# Patient Record
Sex: Female | Born: 1980 | Race: Black or African American | Hispanic: No | Marital: Single | Smoking: Current every day smoker
Health system: Southern US, Community
[De-identification: ages and names within clinical notes are randomized; demographics above are authoritative.]

## PROBLEM LIST (undated history)

## (undated) DIAGNOSIS — I10 Essential (primary) hypertension: Secondary | ICD-10-CM

## (undated) DIAGNOSIS — R102 Pelvic and perineal pain: Secondary | ICD-10-CM

## (undated) DIAGNOSIS — N83209 Unspecified ovarian cyst, unspecified side: Secondary | ICD-10-CM

## (undated) DIAGNOSIS — M1990 Unspecified osteoarthritis, unspecified site: Secondary | ICD-10-CM

## (undated) DIAGNOSIS — Z87442 Personal history of urinary calculi: Secondary | ICD-10-CM

## (undated) DIAGNOSIS — F419 Anxiety disorder, unspecified: Secondary | ICD-10-CM

## (undated) DIAGNOSIS — E669 Obesity, unspecified: Secondary | ICD-10-CM

## (undated) DIAGNOSIS — M25569 Pain in unspecified knee: Secondary | ICD-10-CM

## (undated) DIAGNOSIS — N809 Endometriosis, unspecified: Secondary | ICD-10-CM

## (undated) DIAGNOSIS — R87629 Unspecified abnormal cytological findings in specimens from vagina: Secondary | ICD-10-CM

## (undated) DIAGNOSIS — G8929 Other chronic pain: Secondary | ICD-10-CM

## (undated) DIAGNOSIS — N200 Calculus of kidney: Secondary | ICD-10-CM

## (undated) HISTORY — PX: WISDOM TOOTH EXTRACTION: SHX21

## (undated) HISTORY — PX: ENDOMETRIAL ABLATION: SHX621

## (undated) HISTORY — PX: CHOLECYSTECTOMY: SHX55

## (undated) HISTORY — DX: Unspecified abnormal cytological findings in specimens from vagina: R87.629

## (undated) HISTORY — DX: Essential (primary) hypertension: I10

---

## 2014-06-15 ENCOUNTER — Emergency Department (HOSPITAL_BASED_OUTPATIENT_CLINIC_OR_DEPARTMENT_OTHER)
Admission: EM | Admit: 2014-06-15 | Discharge: 2014-06-15 | Disposition: A | Discharge status: Home / Self Care | Payer: Self-pay | Attending: Emergency Medicine | Admitting: Emergency Medicine

## 2014-06-15 ENCOUNTER — Encounter (HOSPITAL_BASED_OUTPATIENT_CLINIC_OR_DEPARTMENT_OTHER): Payer: Self-pay | Admitting: Emergency Medicine

## 2014-06-15 DIAGNOSIS — Z880 Allergy status to penicillin: Secondary | ICD-10-CM | POA: Insufficient documentation

## 2014-06-15 DIAGNOSIS — R109 Unspecified abdominal pain: Secondary | ICD-10-CM | POA: Insufficient documentation

## 2014-06-15 DIAGNOSIS — R1030 Lower abdominal pain, unspecified: Secondary | ICD-10-CM

## 2014-06-15 DIAGNOSIS — E669 Obesity, unspecified: Secondary | ICD-10-CM | POA: Insufficient documentation

## 2014-06-15 DIAGNOSIS — F17200 Nicotine dependence, unspecified, uncomplicated: Secondary | ICD-10-CM | POA: Insufficient documentation

## 2014-06-15 HISTORY — DX: Obesity, unspecified: E66.9

## 2014-06-15 MED ORDER — NAPROXEN 500 MG PO TABS: 500.0000 mg | ORAL_TABLET | Freq: Two times a day (BID) | ORAL | Status: DC

## 2014-06-15 MED ORDER — NAPROXEN 250 MG PO TABS
500.0000 mg | ORAL_TABLET | Freq: Once | ORAL | Status: AC
  Administered 2014-06-15: 500 mg via ORAL
  Filled 2014-06-15: qty 2

## 2014-06-15 MED ORDER — NAPROXEN 125 MG/5ML PO SUSP
500.0000 mg | Freq: Two times a day (BID) | ORAL | Status: DC
  Filled 2014-06-15: qty 20

## 2014-06-15 NOTE — ED Notes (Signed)
PA at bedside.

## 2014-06-15 NOTE — Discharge Instructions (Signed)
Call for a follow up appointment with a Family or Primary Care Provider.  Return if Symptoms worsen.   Take medication as prescribed.    Emergency Department Resource Guide 1) Find a Doctor and Pay Out of Pocket Although you won't have to find out who is covered by your insurance plan, it is a good idea to ask around and get recommendations. You will then need to call the office and see if the doctor you have chosen will accept you as a new patient and what types of options they offer for patients who are self-pay. Some doctors offer discounts or will set up payment plans for their patients who do not have insurance, but you will need to ask so you aren't surprised when you get to your appointment.  2) Contact Your Local Health Department Not all health departments have doctors that can see patients for sick visits, but many do, so it is worth a call to see if yours does. If you don't know where your local health department is, you can check in your phone book. The CDC also has a tool to help you locate your state's health department, and many state websites also have listings of all of their local health departments.  3) Find a [facility] If your illness is not likely to be very severe or complicated, you may want to try a walk in clinic. These are popping up all over the country in pharmacies, drugstores, and shopping centers. They're usually staffed by nurse practitioners or physician assistants that have been trained to treat common illnesses and complaints. They're usually fairly quick and inexpensive. However, if you have serious medical issues or chronic medical problems, these are probably not your best option.  No Primary Care Doctor: - Call Health Connect at  [phone] - they can help you locate a primary care doctor that  accepts your insurance, provides certain services, etc. - Physician Referral Service- [phone]  Chronic Pain Problems: Organization         Address  Phone    Notes  [facility]  [phone] Patients need to be referred by their primary care doctor.   Medication Assistance: Organization         Address  Phone   Notes  [facility] Medication [facility] [address] [place], [place] [address] [phone] --Must be a resident of [facility] -- Must have NO insurance coverage whatsoever (no Medicaid/ Medicare, etc.) -- The pt. MUST have a primary care doctor that directs their care regularly and follows them in the community   Med[facility]ist  [phone]   [employer]  [phone]    Agencies that provide inexpensive medical care: Organization         Address  Phone   Notes  [place]  [phone]   [facility]    [phone]   [facility] [place],  [phone]   [place] [address], [place] [phone]   Planned Parenthood    [phone]   [facility]    [phone]   [place] and [address], [place] Phone:  [phone], Fax:  [phone] Hours of Operation:  9 am - 6 pm, M-F.  Also accepts Medicaid/Medicare and self-pay.  [facility] for [place] [address], [place]  Phone: [phone], Fax: [phone]. Hours of Operation:  8:30 am - 5:30 pm, M-F.  Also accepts Medicaid and self-pay.  [facility] [place] [address] [place] Phone: [phone]   Red Cross, [place], [place] [phone], Ext. [phone] Mondays & Thursdays: 7-9 AM.  First 15 patients are seen on a first come, first serve basis.    [place] Providers:  Organization         Address  Phone   Notes  [facility] [address], [place] [phone] Also accepts self-pay patients.  [facility]  [address], [place]  [phone]   [place], [address], [place] [phone]   [facility] [address], [place] [phone]   Lucianne Lei [address], [place]   [phone] Only accepts [place] Access [place] patients after they have their name applied to their card.   Self-Pay (no insurance) in [facility]:  Organization         Address  Phone   Notes  Sickle Cell Patients, [facility] Internal [facility] [place] [phone]   [facility] [place] [phone]   [facility] [place]  [place], [place], [place] [phone]   [facility]/Dr. Osei-Bonsu  [address] [place] or [address], [place] [phone] Phone number for both [place] and [place] locations is the same.  Urgent Medical and [facility] [address], [place] [phone]   [facility] [address], [place] or [address] [phone]   [facility] [address], [place] [phone], phone; [phone], fax Sees patients 1st and 3rd Saturday of every month.  Must not qualify for public or private insurance (i.e. Medicaid, Medicare, [place] Health Choice, Veterans' Benefits)  Household income should be no more than 200% of the poverty level The clinic cannot treat you if you are pregnant or think you are pregnant  Sexually transmitted diseases are not treated at the clinic.    Dental Care: Organization         Address  Phone  Notes  [facility] Department of [facility] [place] [phone] Accepts children up to age 27 who are enrolled in [place] or [place]; pregnant women with a Medicaid card; and children who have  applied for Medicaid or [place] Health Choice, but were declined, whose parents can pay a reduced fee at time of service.  [facility] Department of [facility]  [address], [place] [phone] Accepts children up to age 75 who are enrolled in [place] or [place]; pregnant women with a Medicaid card; and children who have applied for Medicaid or [place] Health Choice, but were declined, whose parents can pay a reduced fee at time of service.  [place] Adult Dental Access PROGRAM  [place] [phone] Patients are seen by appointment only. Walk-ins are not accepted. [place] will see patients 62 years of age and older. Monday - Tuesday (8am-5pm) Most Wednesdays (8:30-5pm) $30 per visit, cash only  [place]  [address], [facility] [phone] Patients are seen by appointment only. Walk-ins are not accepted. [place] will see patients 29 years of age and older. One Wednesday Evening (Monthly: Volunteer Based).  $30 per visit, cash only  [place]  [phone] for adults; Children under age 35, call Graduate Pediatric Dentistry at [phone]. Children aged 26-14, please call [phone] to request a pediatric application.  Dental services are provided in all areas of dental care including fillings, crowns and bridges, complete and partial dentures, implants, gum treatment, root canals, and extractions. Preventive care is also provided. Treatment is provided to both adults and children. Patients are selected via a lottery and there is often a waiting list.   [facility] [address] [place]  [phone] [email or web address]   [facility] [address] [place], [place] [phone], Ext. [phone] Second and Fourth Thursday of each month, opens at 6:30 AM; Clinic ends at 9 AM.  Patients are seen on a first-come first-served basis, and a  limited number are seen during each clinic.   [facility]  [address] Hillard Danker [place], [place] [phone]   Eligibility Requirements You must have lived in [place], [place], or [place] counties for at least the last three months.   You cannot be eligible for state or federal sponsored [employer], including [employer], [place], or [employer].   You generally cannot be eligible for healthcare insurance through your employer.    How to apply: Eligibility screenings are held every Tuesday and Wednesday afternoon from 1:00 pm until 4:00 pm. You do not need an appointment for the interview!  [facility] [address] [place], [place]   [place]  [place] Department  [place]  [phone]    Behavioral Health Resources in the Community: Intensive Outpatient Programs Organization         Address  Phone  Notes  [place]. [address] [place], [place] [phone]   [facility] Outpatient [address] [place], [place]   ADS: Alcohol & Drug Svcs [address] [place], [place]   [place] [address],  [place], [place] or [phone]   Substance Abuse Resources Organization         Address  Phone  Notes  Alcohol and Drug Services  [phone]   [place]  [phone]   The [place]   Chinita Pester  [phone]   Residential & Outpatient Substance Abuse Program  [phone]   Psychological Services Organization         Address  Phone  Notes  [facility]  [place]  [phone]   [place] [address] [place] or [phone]    Mobile Crisis Teams Organization          Address  Phone  Notes  [facility], Mobile Crisis Care Unit  [phone]   [facility]  [address] [place], [place]   [place] [address] [place] [phone]    Self-Help/Support Groups Organization         Address  Phone             Notes  [place]. of [place] - variety of  support groups  [phone] Call for more information  Narcotics Anonymous (NA), [facility] [address], [employer] [place]  2 meetings at this location   Residential Facilities manager         Address  Phone  Notes  [facility] [place],    [place]  [phone])[phone]   [facility]  [address] [place] [phone], [place], [place]   [place], [place] [phone] Admissions: 8am-3pm M-F  Incentives Substance [place] [address],    [place], [place] [phone]   [facility] [address] [place] [address] [place], [place], [place]   [facility] [address]  [place], [place]   [facility]ight Programs - Intensive Outpatient [address] Kristeen [address], [place], [place]   [facility].,  [place], [place] [phone] or [phone]   [facility] Treatment Services (RTS) [address] [place], [place] Accepts Medicaid  [facility] [address],  [place] [phone] Substance Abuse/Addiction Treatment   [facility] Organization         Address  Phone  Notes  [facility]  [phone]   Domenic Schwab, PhD [address] Arlis Porta [place], [place]   [phone] or [phone]   Alma [place] Nenzel [place], [place] [phone]   [facility] [address] [place], [place] [phone] Insurance/Medicaid/sponsorship  through [facility] and Families [address], [address] [employer]                                    [place], [place] [phone] [place] [address], [place] [phone]    Dr. Adele Schilder  [phone]   [facility] Dept. 1) [address] [place] 2) [place] 3)  [place] [address], [place] [phone]  [phone]   [place] [phone] or [phone] (After Hours)

## 2014-06-15 NOTE — ED Notes (Signed)
Pt instructed to call for ride

## 2014-06-15 NOTE — ED Provider Notes (Signed)
CSN: [id number]     Arrival date & time 06/15/14  1653 History   None    Chief Complaint  Patient presents with  . Abdominal Pain     (Consider location/radiation/quality/duration/timing/severity/associated sxs/prior Treatment) HPI Comments: The patient is a 32 year old female with past history of obesity presenting to the emergency room and chief complaint of low abdominal pain. The patient reports she [facility] night for similar complaints and was diagnosed with abdominal pain of unknown etiology, after CT and ultrasounds were performed. She reports low sharp intermittent pain for several hours, stating she was unable to fill her prescriptions because she did not have a ride. Reports last BM, today, no blood, no pus. She denies nausea, vomiting, fever, chills, diarrhea, constipation, urinary symptoms, vaginal discharge. LNMP was greater than one year ago, prior to endometrial ablation.  The history is provided by the patient and medical records. No language interpreter was used.    Past Medical History  Diagnosis Date  . Obese    Past Surgical History  Procedure Laterality Date  . Cholecystectomy     No family history on file. History  Substance Use Topics  . Smoking status: Current Every Day Smoker  . Smokeless tobacco: Not on file  . Alcohol Use: Not on file   OB History   Grav Para Term Preterm Abortions TAB SAB Ect Mult Living                 Review of Systems  Constitutional: Negative for fever and chills.  Gastrointestinal: Positive for abdominal pain. Negative for nausea, vomiting, diarrhea, constipation, blood in stool and anal bleeding.  Genitourinary: Negative for dysuria, urgency, frequency and vaginal bleeding.      Allergies  Penicillins  Home Medications   Prior to Admission medications   Not on File   BP 136/92  Pulse 83  Temp(Src) 97.3 F (36.3 C) (Oral)  Resp 28  Ht 5\' 9"  (1.753 m)  Wt 347 lb (157.398 kg)  BMI  51.22 kg/m2  SpO2 98% Physical Exam  Nursing note and vitals reviewed. Constitutional: She is oriented to person, place, and time. She appears well-developed and well-nourished.  Non-toxic appearance. She does not have a sickly appearance. She does not appear ill. No distress.  HENT:  Head: Normocephalic and atraumatic.  Eyes: EOM are normal. Pupils are equal, round, and reactive to light.  Neck: Normal range of motion. Neck supple.  Cardiovascular: Normal rate and regular rhythm.   Pulmonary/Chest: Effort normal and breath sounds normal. No accessory muscle usage. Not tachypneic. No respiratory distress. She has no decreased breath sounds. She has no wheezes. She has no rhonchi. She has no rales.  No tachypnea on exam, Patient is able to speak in complete sentences.   Abdominal: Soft. Bowel sounds are normal. She exhibits no distension. There is tenderness. There is no rebound, no guarding and no CVA tenderness.  Obese abdomen. Abdominal exam limited by patient's body habitus.   Musculoskeletal: Normal range of motion.  Neurological: She is alert and oriented to person, place, and time.  Skin: Skin is warm and dry. She is not diaphoretic.  Psychiatric: She has a normal mood and affect.    ED Course  Procedures (including critical care time) Labs Review Labs Reviewed - No data to display  Imaging Review No results found.   EKG Interpretation None      MDM   Final diagnoses:  Abdominal pain, lower   Patient presents  with lower abdominal pain, unable to fill pain prescription. [facility], outside records were obtained showing an abdominal CT, [place], and transvaginal US. Negative urine pregnancy, negative CT no signs of appendicitis, nonvisualization of the right ovary, trace free fluid likely physiologic, otherwise unremarkable pelvic ultrasound.  PE shows obese female, minimal tenderness to palpation no guarding or rebound, afebrile in NAD. Discussed  treatment of oral pain medication and discharged home. Patient agreed to plan. I don't feel that any further testing is warranted at this time.  Meds given in ED:  Medications  naproxen (NAPROSYN) tablet 500 mg (500 mg Oral Given 06/15/14 1805)    New Prescriptions   NAPROXEN (NAPROSYN) 500 MG TABLET    Take 1 tablet (500 mg total) by mouth 2 (two) times daily.        Lorrine Kin, PA-C 06/15/14 1827

## 2014-06-15 NOTE — ED Notes (Signed)
Records received from [facility]

## 2014-06-15 NOTE — ED Notes (Signed)
LLQ abdominal pain since yesterday.  Was seen at [facility] last night.  Pain medication wore off and unable to fill prescription.

## 2014-06-15 NOTE — ED Notes (Signed)
Faxed medical records request over to [facility] at 1704, spoke with medical record rep Constance Holster at 1704 stated that she would get records over

## 2014-06-16 NOTE — ED Provider Notes (Signed)
Medical screening examination/treatment/procedure(s) were performed by non-physician practitioner and as supervising physician I was immediately available for consultation/collaboration.   EKG Interpretation None       Babette Relic, MD 06/16/14 1035

## 2014-07-31 ENCOUNTER — Encounter: Payer: Self-pay | Admitting: Obstetrics & Gynecology

## 2014-07-31 ENCOUNTER — Telehealth: Payer: Self-pay | Admitting: General Practice

## 2014-07-31 NOTE — Telephone Encounter (Signed)
Patient no showed for appt. Called patient and she states she knows she missed the appt and that she is out of town in [place] right now and will call us back to reschedule when she gets back into town. Patient had no questions

## 2018-07-05 ENCOUNTER — Emergency Department (HOSPITAL_COMMUNITY)
Admission: EM | Admit: 2018-07-05 | Discharge: 2018-07-05 | Disposition: A | Discharge status: Home / Self Care | Payer: Self-pay | Attending: Emergency Medicine | Admitting: Emergency Medicine

## 2018-07-05 ENCOUNTER — Other Ambulatory Visit: Payer: Self-pay

## 2018-07-05 DIAGNOSIS — F17200 Nicotine dependence, unspecified, uncomplicated: Secondary | ICD-10-CM | POA: Insufficient documentation

## 2018-07-05 DIAGNOSIS — Z7689 Persons encountering health services in other specified circumstances: Secondary | ICD-10-CM

## 2018-07-05 DIAGNOSIS — B9689 Other specified bacterial agents as the cause of diseases classified elsewhere: Secondary | ICD-10-CM | POA: Insufficient documentation

## 2018-07-05 DIAGNOSIS — N760 Acute vaginitis: Secondary | ICD-10-CM | POA: Insufficient documentation

## 2018-07-05 LAB — URINALYSIS, ROUTINE W REFLEX MICROSCOPIC
Bilirubin Urine: NEGATIVE
GLUCOSE, UA: NEGATIVE mg/dL
HGB URINE DIPSTICK: NEGATIVE
Ketones, ur: NEGATIVE mg/dL
Leukocytes, UA: NEGATIVE
Nitrite: NEGATIVE
PH: 5 (ref 5.0–8.0)
Protein, ur: NEGATIVE mg/dL
SPECIFIC GRAVITY, URINE: 1.023 (ref 1.005–1.030)

## 2018-07-05 LAB — WET PREP, GENITAL
Sperm: NONE SEEN
TRICH WET PREP: NONE SEEN
WBC WET PREP: NONE SEEN
Yeast Wet Prep HPF POC: NONE SEEN

## 2018-07-05 LAB — PREGNANCY, URINE: Preg Test, Ur: NEGATIVE

## 2018-07-05 MED ORDER — FLUCONAZOLE 200 MG PO TABS: 200.0000 mg | ORAL_TABLET | Freq: Once | ORAL | 1 refills | Status: AC

## 2018-07-05 MED ORDER — METRONIDAZOLE 500 MG PO TABS: 500.0000 mg | ORAL_TABLET | Freq: Two times a day (BID) | ORAL | 0 refills | Status: AC

## 2018-07-05 NOTE — ED Triage Notes (Signed)
Pt arrives via POV from home. Pt reports that she wishes to have STD testing and HIV testing. Pt reports some instances where she has had unprotected sex with multiple partners. " I just want to get checked to make sure I aint got nothing"

## 2018-07-05 NOTE — Discharge Instructions (Signed)
You have been seen today in the Emergency Department (ED) for STD check. We found some evidence of bacterial vaginosis which is not sexually transmitted. Your STD tests will not result for 1-2 days. You will be called if any come back positive. Please have your partner tested for STD's and do not resume sexual activity until you and your partner have confirmed negative test results or have both been treated.  Please follow up with your doctor as soon as possible regarding today?s ED visit and your symptoms.   Return to the ED if your pain worsens, you develop a fever, or for any other symptoms that concern you.

## 2018-07-05 NOTE — ED Provider Notes (Signed)
Emergency Department Provider Note   I have reviewed the triage vital signs and the nursing notes.   HISTORY  Chief Complaint STD Check   HPI Kristin Boyd is a 37 y.o. female with PMH of elevated BMI presents to the emergency department for evaluation of STD check.  Patient denies any vaginal or abdominal symptoms at this time.  She states she has had multiple encounters with various individuals and wants to "make sure I'm ok."  Her last STD/HIV testing was several years ago.  She denies any flulike symptoms.  No vaginal bleeding or discharge.  Denies any dysuria, hesitancy, urgency.  No radiation of symptoms or other modifying factors.  Past Medical History:  Diagnosis Date  . Obese     There are no active problems to display for this patient.  Allergies Penicillins  No family history on file.  Social History Social History   Tobacco Use  . Smoking status: Current Every Day Smoker  Substance Use Topics  . Alcohol use: Not on file  . Drug use: Not on file    Review of Systems  Constitutional: No fever/chills Eyes: No visual changes. ENT: No sore throat. Cardiovascular: Denies chest pain. Respiratory: Denies shortness of breath. Gastrointestinal: No abdominal pain.  No nausea, no vomiting.  No diarrhea.  No constipation. Genitourinary: Negative for dysuria. Musculoskeletal: Negative for back pain. Skin: Negative for rash. Neurological: Negative for headaches, focal weakness or numbness.  10-point ROS otherwise negative.  ____________________________________________   PHYSICAL EXAM:  VITAL SIGNS: ED Triage Vitals  Enc Vitals Group     BP 07/05/18 0709 (!) 190/136     Pulse Rate 07/05/18 0709 86     Resp 07/05/18 0709 15     Temp 07/05/18 0709 98.4 F (36.9 C)     Temp Source 07/05/18 0709 Oral     SpO2 07/05/18 0709 100 %     Weight 07/05/18 0714 (!) 378 lb (171.5 kg)     Height 07/05/18 0714 5\' 9"  (1.753 m)     Pain Score 07/05/18 0714 0    Constitutional: Alert and oriented. Well appearing and in no acute distress. Eyes: Conjunctivae are normal. Head: Atraumatic. Nose: No congestion/rhinnorhea. Mouth/Throat: Mucous membranes are moist.  Neck: No stridor.   Cardiovascular: Normal rate, regular rhythm. Good peripheral circulation. Grossly normal heart sounds.   Respiratory: Normal respiratory effort.  No retractions. Lungs CTAB. Gastrointestinal: Soft and nontender. No distention.  Genitourinary: Chaperone present for exam. Mild discharge. No bleeding. Normal external genitalia. No CMT or adnexal tenderness/fullness.  Musculoskeletal: No lower extremity tenderness nor edema. No gross deformities of extremities. Neurologic:  Normal speech and language. No gross focal neurologic deficits are appreciated.  Skin:  Skin is warm, dry and intact. No rash noted.  ____________________________________________   LABS (all labs ordered are listed, but only abnormal results are displayed)  Labs Reviewed  WET PREP, GENITAL - Abnormal; Notable for the following components:      Result Value   Clue Cells Wet Prep HPF POC PRESENT (*)    All other components within normal limits  PREGNANCY, URINE  URINALYSIS, ROUTINE W REFLEX MICROSCOPIC  HIV ANTIBODY (ROUTINE TESTING)  RPR  GC/CHLAMYDIA PROBE AMP ([place]) NOT AT [facility]   ____________________________________________   PROCEDURES  Procedure(s) performed:   Procedures  None ____________________________________________   INITIAL IMPRESSION / ASSESSMENT AND PLAN / ED COURSE  Pertinent labs & imaging results that were available during my care of the patient were reviewed by me  and considered in my medical decision making (see chart for details).  Patient presents to the emergency department for evaluation of STD check.  She is asymptomatic at this time.  She does not have a PCP or OB provider.  Plan for HIV, RPR, wet prep, gonorrhea/chlamydia.   Patient with clue cells  on wet prep. No trich. STD testing pending. Without specific symptoms will not treat empirically in the ED. No clinical concern for PID. UA negative. Provided contact information for PCP and OB locally for more routine care. Diflucan Rx given in case yeast infection develops while on abx. Discussed nausea/vomiting reaction is drinking EtOH while taking Flagyl.   At this time, I do not feel there is any life-threatening condition present. I have reviewed and discussed all results (EKG, imaging, lab, urine as appropriate), exam findings with patient. I have reviewed nursing notes and appropriate previous records.  I feel the patient is safe to be discharged home without further emergent workup. Discussed usual and customary return precautions. Patient and family (if present) verbalize understanding and are comfortable with this plan.  Patient will follow-up with their primary care provider. If they do not have a primary care provider, information for follow-up has been provided to them. All questions have been answered.  ____________________________________________  FINAL CLINICAL IMPRESSION(S) / ED DIAGNOSES  Final diagnoses:  Bacterial vaginosis  Encounter for assessment of STD exposure    NEW OUTPATIENT MEDICATIONS STARTED DURING THIS VISIT:  Discharge Medication List as of 07/05/2018  9:08 AM    START taking these medications   Details  fluconazole (DIFLUCAN) 200 MG tablet Take 1 tablet (200 mg total) by mouth once for 1 dose., Starting Mon 07/05/2018, Normal    metroNIDAZOLE (FLAGYL) 500 MG tablet Take 1 tablet (500 mg total) by mouth 2 (two) times daily for 7 days., Starting Mon 07/05/2018, Until Mon 07/12/2018, Normal        Note:  This document was prepared using Dragon voice recognition software and may include unintentional dictation errors.  Nanda Quinton, MD Emergency Medicine    Lannah Koike, [facility], MD 07/05/18 1034

## 2018-07-06 LAB — RPR: RPR Ser Ql: NONREACTIVE

## 2018-07-06 LAB — HIV ANTIBODY (ROUTINE TESTING W REFLEX): HIV Screen 4th Generation wRfx: NONREACTIVE

## 2018-07-07 LAB — GC/CHLAMYDIA PROBE AMP (~~LOC~~) NOT AT ARMC
CHLAMYDIA, DNA PROBE: NEGATIVE
Neisseria Gonorrhea: NEGATIVE

## 2018-10-31 ENCOUNTER — Other Ambulatory Visit: Payer: Self-pay

## 2018-10-31 ENCOUNTER — Emergency Department (HOSPITAL_BASED_OUTPATIENT_CLINIC_OR_DEPARTMENT_OTHER)
Admission: EM | Admit: 2018-10-31 | Discharge: 2018-11-01 | Disposition: A | Discharge status: Home / Self Care | Payer: Self-pay | Attending: Emergency Medicine | Admitting: Emergency Medicine

## 2018-10-31 ENCOUNTER — Encounter (HOSPITAL_BASED_OUTPATIENT_CLINIC_OR_DEPARTMENT_OTHER): Payer: Self-pay | Admitting: *Deleted

## 2018-10-31 DIAGNOSIS — I10 Essential (primary) hypertension: Secondary | ICD-10-CM | POA: Insufficient documentation

## 2018-10-31 DIAGNOSIS — K0889 Other specified disorders of teeth and supporting structures: Secondary | ICD-10-CM | POA: Insufficient documentation

## 2018-10-31 DIAGNOSIS — Z87891 Personal history of nicotine dependence: Secondary | ICD-10-CM | POA: Insufficient documentation

## 2018-10-31 MED ORDER — CLINDAMYCIN HCL 150 MG PO CAPS
300.0000 mg | ORAL_CAPSULE | Freq: Once | ORAL | Status: AC
  Administered 2018-10-31: 300 mg via ORAL
  Filled 2018-10-31: qty 2

## 2018-10-31 MED ORDER — KETOROLAC TROMETHAMINE 30 MG/ML IJ SOLN
30.0000 mg | Freq: Once | INTRAMUSCULAR | Status: AC
  Administered 2018-10-31: 30 mg via INTRAMUSCULAR
  Filled 2018-10-31: qty 1

## 2018-10-31 MED ORDER — HYDROCODONE-ACETAMINOPHEN 5-325 MG PO TABS
1.0000 | ORAL_TABLET | Freq: Once | ORAL | Status: AC
  Administered 2018-10-31: 1 via ORAL
  Filled 2018-10-31: qty 1

## 2018-10-31 NOTE — ED Notes (Signed)
ED Provider at bedside. 

## 2018-10-31 NOTE — ED Notes (Addendum)
Pt c/o toothache on left lower side that is affecting the entire left jaw. She states pain x 3 days. She states that the tooth is broken and the pain worsened tonite. Pt states she took 800 mg ibuprofen and used Orajel at 10.30 tonight.

## 2018-10-31 NOTE — ED Triage Notes (Signed)
Left lower toothache x 3 days. States she has a broken tooth on that side

## 2018-10-31 NOTE — ED Provider Notes (Signed)
[facility] EMERGENCY DEPARTMENT Provider Note   CSN: [id number] Arrival date & time: 10/31/18  2315     History   Chief Complaint Chief Complaint  Patient presents with  . Dental Pain    HPI Kristin Boyd is a 37 y.o. female.  HPI  This is a 37 year old female who presents with dental pain.  Patient reports 3-day history of worsening left lower dental pain.  She states that she has a known fractured tooth there that has not previously given her problems but over the last 2 to 3 days she is had worsening pain.  She is taking ibuprofen and Orajel with minimal relief.  She currently rates her pain at 10 out of 10.  She denies any difficulty swallowing or fevers.  She does not currently have a dentist.  Past Medical History:  Diagnosis Date  . Obese     There are no active problems to display for this patient.   Past Surgical History:  Procedure Laterality Date  . CESAREAN SECTION    . CHOLECYSTECTOMY    . ENDOMETRIAL ABLATION       OB History   None      Home Medications    Prior to Admission medications   Not on File    Family History No family history on file.  Social History Social History   Tobacco Use  . Smoking status: Former Research scientist (life sciences)  . Smokeless tobacco: Never Used  Substance Use Topics  . Alcohol use: Yes    Comment: occasional  . Drug use: Never     Allergies   Penicillins   Review of Systems Review of Systems  Constitutional: Negative for fever.  HENT: Positive for dental problem. Negative for trouble swallowing.   All other systems reviewed and are negative.    Physical Exam Updated Vital Signs BP (!) 212/109 (BP Location: Left Arm)   Pulse 90   Temp 99 F (37.2 C) (Oral)   Resp 20   SpO2 100%   Physical Exam  Constitutional: She is oriented to person, place, and time.  Obese, tearful, nontoxic  HENT:  Head: Normocephalic and atraumatic.  Generally poor dentition with multiple dental caries noted, there  is a fracture to the left lower molar, no palpable abscess, no trismus, no fullness noted under the tongue, uvula midline  Neck: Neck supple.  Cardiovascular: Normal rate and regular rhythm.  Pulmonary/Chest: Effort normal. No respiratory distress.  Neurological: She is alert and oriented to person, place, and time.  Skin: Skin is warm and dry.  Psychiatric: She has a normal mood and affect.  Nursing note and vitals reviewed.    ED Treatments / Results  Labs (all labs ordered are listed, but only abnormal results are displayed) Labs Reviewed - No data to display  EKG None  Radiology No results found.  Procedures Procedures (including critical care time)  Medications Ordered in ED Medications  ketorolac (TORADOL) 30 MG/ML injection 30 mg (has no administration in time range)  HYDROcodone-acetaminophen (NORCO/VICODIN) 5-325 MG per tablet 1 tablet (has no administration in time range)  clindamycin (CLEOCIN) capsule 300 mg (has no administration in time range)     Initial Impression / Assessment and Plan / ED Course  I have reviewed the triage vital signs and the nursing notes.  Pertinent labs & imaging results that were available during my care of the patient were reviewed by me and considered in my medical decision making (see chart for details).  Patient presents with dental pain.  She is overall nontoxic-appearing.  Vital signs notable for a blood pressure of 212/109.  Denies history.  She does report significant pain.  Patient was given IM Toradol, Norco.  She has a penicillin allergy.  Patient will be started on clindamycin.  There is no drainable abscess at this time.  No indication of deep space infection or Ludwig's.  Patient will need to follow-up with dentistry.  Discussed with patient that she needs to have her blood pressure rechecked by primary physician and if it remains persistently elevated after her pain has resolved, she may need to be started on blood  pressure medication.  Patient stated understanding.  After history, exam, and medical workup I feel the patient has been appropriately medically screened and is safe for discharge home. Pertinent diagnoses were discussed with the patient. Patient was given return precautions.   Final Clinical Impressions(s) / ED Diagnoses   Final diagnoses:  Pain, dental  Hypertension, unspecified type    ED Discharge Orders    None       Merryl Hacker, MD 10/31/18 2344

## 2018-11-01 MED ORDER — NAPROXEN 500 MG PO TABS: 500.0000 mg | ORAL_TABLET | Freq: Two times a day (BID) | ORAL | 0 refills | Status: DC

## 2018-11-01 MED ORDER — CLINDAMYCIN HCL 150 MG PO CAPS: 150.0000 mg | ORAL_CAPSULE | Freq: Four times a day (QID) | ORAL | 0 refills | Status: DC

## 2018-11-01 NOTE — Discharge Instructions (Signed)
You were seen today for dental pain.  You were given antibiotics.  Take naproxen as needed for pain.  You were noted to have an elevated blood pressure.  You need to have this rechecked by primary physician to ensure that this improves with pain control.  If not, you may need to be started on blood pressure medications.  If you develop chest pain, headache, any new or worsening symptoms you should be reevaluated.

## 2019-02-15 ENCOUNTER — Other Ambulatory Visit: Payer: Self-pay

## 2019-02-15 ENCOUNTER — Encounter (HOSPITAL_BASED_OUTPATIENT_CLINIC_OR_DEPARTMENT_OTHER): Payer: Self-pay

## 2019-02-15 ENCOUNTER — Emergency Department (HOSPITAL_BASED_OUTPATIENT_CLINIC_OR_DEPARTMENT_OTHER)
Admission: EM | Admit: 2019-02-15 | Discharge: 2019-02-16 | Disposition: A | Discharge status: Home / Self Care | Payer: Self-pay | Attending: Emergency Medicine | Admitting: Emergency Medicine

## 2019-02-15 DIAGNOSIS — N390 Urinary tract infection, site not specified: Secondary | ICD-10-CM | POA: Insufficient documentation

## 2019-02-15 DIAGNOSIS — Z87891 Personal history of nicotine dependence: Secondary | ICD-10-CM | POA: Insufficient documentation

## 2019-02-15 DIAGNOSIS — Z880 Allergy status to penicillin: Secondary | ICD-10-CM | POA: Insufficient documentation

## 2019-02-15 DIAGNOSIS — R102 Pelvic and perineal pain: Secondary | ICD-10-CM | POA: Insufficient documentation

## 2019-02-15 HISTORY — DX: Other chronic pain: G89.29

## 2019-02-15 HISTORY — DX: Pelvic and perineal pain: R10.2

## 2019-02-15 HISTORY — DX: Pain in unspecified knee: M25.569

## 2019-02-15 NOTE — ED Notes (Signed)
Pt took Naprosyn 500mg  @ 2325. Pt sts she has had similar pain like this and takes Naprosyn and uses heat and pain improves. Tonight pain has not subsided.

## 2019-02-15 NOTE — ED Triage Notes (Signed)
Very low abdominal pain for the last ten minutes, denies n/v/d, denies pregnancy, pt is sobbing in triage

## 2019-02-16 ENCOUNTER — Encounter (HOSPITAL_BASED_OUTPATIENT_CLINIC_OR_DEPARTMENT_OTHER): Payer: Self-pay | Admitting: Emergency Medicine

## 2019-02-16 LAB — URINALYSIS, ROUTINE W REFLEX MICROSCOPIC
Bilirubin Urine: NEGATIVE
GLUCOSE, UA: NEGATIVE mg/dL
HGB URINE DIPSTICK: NEGATIVE
Ketones, ur: NEGATIVE mg/dL
Nitrite: POSITIVE — AB
Protein, ur: NEGATIVE mg/dL
Specific Gravity, Urine: 1.025 (ref 1.005–1.030)
pH: 6 (ref 5.0–8.0)

## 2019-02-16 LAB — CBC WITH DIFFERENTIAL/PLATELET
Abs Immature Granulocytes: 0.02 10*3/uL (ref 0.00–0.07)
Basophils Absolute: 0 10*3/uL (ref 0.0–0.1)
Basophils Relative: 0 %
EOS ABS: 0.1 10*3/uL (ref 0.0–0.5)
EOS PCT: 1 %
HEMATOCRIT: 43.5 % (ref 36.0–46.0)
Hemoglobin: 13.5 g/dL (ref 12.0–15.0)
IMMATURE GRANULOCYTES: 0 %
LYMPHS ABS: 3.4 10*3/uL (ref 0.7–4.0)
Lymphocytes Relative: 33 %
MCH: 26.1 pg (ref 26.0–34.0)
MCHC: 31 g/dL (ref 30.0–36.0)
MCV: 84.1 fL (ref 80.0–100.0)
MONOS PCT: 6 %
Monocytes Absolute: 0.6 10*3/uL (ref 0.1–1.0)
Neutro Abs: 6.1 10*3/uL (ref 1.7–7.7)
Neutrophils Relative %: 60 %
Platelets: 268 10*3/uL (ref 150–400)
RBC: 5.17 MIL/uL — ABNORMAL HIGH (ref 3.87–5.11)
RDW: 15.1 % (ref 11.5–15.5)
WBC: 10.3 10*3/uL (ref 4.0–10.5)
nRBC: 0 % (ref 0.0–0.2)

## 2019-02-16 LAB — BASIC METABOLIC PANEL
ANION GAP: 7 (ref 5–15)
BUN: 11 mg/dL (ref 6–20)
CO2: 24 mmol/L (ref 22–32)
Calcium: 8.9 mg/dL (ref 8.9–10.3)
Chloride: 108 mmol/L (ref 98–111)
Creatinine, Ser: 0.72 mg/dL (ref 0.44–1.00)
GFR calc Af Amer: >60 mL/min (ref >60)
GFR calc non Af Amer: >60 mL/min (ref >60)
Glucose, Bld: 124 mg/dL — ABNORMAL HIGH (ref 70–99)
Potassium: 3.5 mmol/L (ref 3.5–5.1)
Sodium: 139 mmol/L (ref 135–145)

## 2019-02-16 LAB — URINALYSIS, MICROSCOPIC (REFLEX)

## 2019-02-16 LAB — PREGNANCY, URINE: Preg Test, Ur: NEGATIVE

## 2019-02-16 MED ORDER — NITROFURANTOIN MONOHYD MACRO 100 MG PO CAPS
100.0000 mg | ORAL_CAPSULE | Freq: Once | ORAL | Status: AC
  Administered 2019-02-16: 100 mg via ORAL
  Filled 2019-02-16: qty 1

## 2019-02-16 MED ORDER — ONDANSETRON HCL 4 MG/2ML IJ SOLN
4.0000 mg | Freq: Once | INTRAMUSCULAR | Status: AC
  Administered 2019-02-16: 4 mg via INTRAVENOUS
  Filled 2019-02-16: qty 2

## 2019-02-16 MED ORDER — NITROFURANTOIN MONOHYD MACRO 100 MG PO CAPS: 100.0000 mg | ORAL_CAPSULE | Freq: Two times a day (BID) | ORAL | 0 refills | Status: DC

## 2019-02-16 MED ORDER — SODIUM CHLORIDE 0.9 % IV BOLUS
1000.0000 mL | Freq: Once | INTRAVENOUS | Status: AC
  Administered 2019-02-16: 1000 mL via INTRAVENOUS

## 2019-02-16 MED ORDER — FENTANYL CITRATE (PF) 100 MCG/2ML IJ SOLN
100.0000 ug | Freq: Once | INTRAMUSCULAR | Status: AC
  Administered 2019-02-16: 100 ug via INTRAVENOUS
  Filled 2019-02-16: qty 2

## 2019-02-16 NOTE — ED Notes (Signed)
Pt still unable to urinate at this time 

## 2019-02-16 NOTE — ED Provider Notes (Signed)
[facility] Provider Note: Georgena Spurling, MD, FACEP  CSN: [id number] MRN: [id number] ARRIVAL: 02/15/19 at 2325 ROOM: [place]  Abdominal Pain   HISTORY OF PRESENT ILLNESS  02/16/19 12:17 AM Kristin Boyd is a 38 y.o. female who had the sudden onset of sharp, severe pelvic pain about 10 minutes prior to arrival.  The pain is now about a 7 out of 10.  It is worse with movement and less so with palpation.  She denies associated fever, chills, nausea, vomiting, diarrhea, vaginal bleeding, vaginal discharge or urinary symptoms.  She has not had a menstrual period since 2014 status post uterine ablation.   Past Medical History:  Diagnosis Date  . Chronic knee pain   . Chronic pelvic pain in female   . Obese     Past Surgical History:  Procedure Laterality Date  . CESAREAN SECTION    . CHOLECYSTECTOMY    . ENDOMETRIAL ABLATION      No family history on file.  Social History   Tobacco Use  . Smoking status: Former Research scientist (life sciences)  . Smokeless tobacco: Never Used  Substance Use Topics  . Alcohol use: Yes    Comment: occasional  . Drug use: Never    Prior to Admission medications   Medication Sig Start Date End Date Taking? Authorizing Provider  clindamycin (CLEOCIN) 150 MG capsule Take 1 capsule (150 mg total) by mouth every 6 (six) hours. 11/01/18   Horton, Barbette Hair, MD  naproxen (NAPROSYN) 500 MG tablet Take 1 tablet (500 mg total) by mouth 2 (two) times daily. 11/01/18   Horton, Barbette Hair, MD    Allergies Penicillins   REVIEW OF SYSTEMS  Negative except as noted here or in the History of Present Illness.   PHYSICAL EXAMINATION  Initial Vital Signs Blood pressure (!) 173/105, pulse (!) 105, temperature 98.7 F (37.1 C), temperature source Oral, resp. rate (!) 22, height 5\' 9"  (1.753 m), weight (!) 181.3 kg, SpO2 100 %.  Examination General: Well-developed, obese female in no acute distress; appearance consistent with age of  record HENT: normocephalic; atraumatic Eyes: pupils equal, round and reactive to light; extraocular muscles intact Neck: supple Heart: regular rate and rhythm Lungs: clear to auscultation bilaterally Abdomen: soft; obese; mild suprapubic tenderness; bowel sounds present Extremities: No deformity; full range of motion; pulses normal Neurologic: Awake, alert and oriented; motor function intact in all extremities and symmetric; no facial droop Skin: Warm and dry Psychiatric: Normal mood and affect   RESULTS  Summary of this visit's results, reviewed by myself:   EKG Interpretation  Date/Time:    Ventricular Rate:    PR Interval:    QRS Duration:   QT Interval:    QTC Calculation:   R Axis:     Text Interpretation:        Laboratory Studies: Results for orders placed or performed during the hospital encounter of 02/15/19 (from the past 24 hour(s))  Urinalysis, Routine w reflex microscopic     Status: Abnormal   Collection Time: 02/16/19 12:31 AM  Result Value Ref Range   Color, Urine YELLOW YELLOW   APPearance HAZY (A) CLEAR   Specific Gravity, Urine 1.025 1.005 - 1.030   pH 6.0 5.0 - 8.0   Glucose, UA NEGATIVE NEGATIVE mg/dL   Hgb urine dipstick NEGATIVE NEGATIVE   Bilirubin Urine NEGATIVE NEGATIVE   Ketones, ur NEGATIVE NEGATIVE mg/dL   Protein, ur NEGATIVE NEGATIVE mg/dL   Nitrite POSITIVE (A) NEGATIVE  Leukocytes,Ua TRACE (A) NEGATIVE  Pregnancy, urine     Status: None   Collection Time: 02/16/19 12:31 AM  Result Value Ref Range   Preg Test, Ur NEGATIVE NEGATIVE  CBC with Differential/Platelet     Status: Abnormal   Collection Time: 02/16/19 12:31 AM  Result Value Ref Range   WBC 10.3 4.0 - 10.5 K/uL   RBC 5.17 (H) 3.87 - 5.11 MIL/uL   Hemoglobin 13.5 12.0 - 15.0 g/dL   HCT 43.5 36.0 - 46.0 %   MCV 84.1 80.0 - 100.0 fL   MCH 26.1 26.0 - 34.0 pg   MCHC 31.0 30.0 - 36.0 g/dL   RDW 15.1 11.5 - 15.5 %   Platelets 268 150 - 400 K/uL   nRBC 0.0 0.0 - 0.2 %    Neutrophils Relative % 60 %   Neutro Abs 6.1 1.7 - 7.7 K/uL   Lymphocytes Relative 33 %   Lymphs Abs 3.4 0.7 - 4.0 K/uL   Monocytes Relative 6 %   Monocytes Absolute 0.6 0.1 - 1.0 K/uL   Eosinophils Relative 1 %   Eosinophils Absolute 0.1 0.0 - 0.5 K/uL   Basophils Relative 0 %   Basophils Absolute 0.0 0.0 - 0.1 K/uL   Immature Granulocytes 0 %   Abs Immature Granulocytes 0.02 0.00 - 0.07 K/uL  Basic metabolic panel     Status: Abnormal   Collection Time: 02/16/19 12:31 AM  Result Value Ref Range   Sodium 139 135 - 145 mmol/L   Potassium 3.5 3.5 - 5.1 mmol/L   Chloride 108 98 - 111 mmol/L   CO2 24 22 - 32 mmol/L   Glucose, Bld 124 (H) 70 - 99 mg/dL   BUN 11 6 - 20 mg/dL   Creatinine, Ser 0.72 0.44 - 1.00 mg/dL   Calcium 8.9 8.9 - 10.3 mg/dL   GFR calc non Af Amer >60 >60 mL/min   GFR calc Af Amer >60 >60 mL/min   Anion gap 7 5 - 15  Urinalysis, Microscopic (reflex)     Status: Abnormal   Collection Time: 02/16/19 12:31 AM  Result Value Ref Range   RBC / HPF 0-5 0 - 5 RBC/hpf   WBC, UA 11-20 0 - 5 WBC/hpf   Bacteria, UA MANY (A) NONE SEEN   Squamous Epithelial / LPF 0-5 0 - 5   Imaging Studies: No results found.  ED COURSE and MDM  Nursing notes and initial vitals signs, including pulse oximetry, reviewed.  Vitals:   02/15/19 2330 02/15/19 2331 02/15/19 2332 02/16/19 0015  BP:  (!) 220/129  (!) 173/105  Pulse:  (!) 105    Resp:  (!) 22    Temp:  98.7 F (37.1 C)    TempSrc:  Oral    SpO2:  100%    Weight:   (!) 181.3 kg   Height: 5\' 9"  (1.753 m)      2:28 AM Patient's pain is almost completely resolved.  Her abdomen is soft and nontender.  I suspect this represents mittelschmerz as she does still have her ovaries.  She also shows signs of urinary tract infection and we will treat accordingly.  Urine has been sent for culture.  PROCEDURES    ED DIAGNOSES     ICD-10-CM   1. Pelvic pain in female R10.2   2. Lower urinary tract infectious disease N39.0         Markel Kurtenbach, MD 02/16/19 [phone]

## 2019-02-18 LAB — URINE CULTURE

## 2019-02-19 ENCOUNTER — Telehealth: Payer: Self-pay | Admitting: Emergency Medicine

## 2019-02-19 NOTE — Telephone Encounter (Signed)
Post ED Visit - Positive Culture Follow-up  Culture report reviewed by antimicrobial stewardship pharmacist: [facility] [place] Team []  Elenor Quinones, Pharm.D. []  Heide Guile, Pharm.D., BCPS AQ-ID []  Parks Neptune, Pharm.D., BCPS []  Alycia Rossetti, Pharm.D., BCPS []  [place], Pharm.D., BCPS, AAHIVP []  Legrand Como, Pharm.D., BCPS, AAHIVP []  Salome Arnt, PharmD, BCPS []  Johnnette Gourd, PharmD, BCPS []  Hughes Better, PharmD, BCPS []  Leeroy Cha, PharmD [x]  Laqueta Linden, PharmD, BCPS []  Albertina Parr, PharmD  [place] Team []  Leodis Sias, PharmD []  Lindell Spar, PharmD []  Royetta Asal, PharmD []  Graylin Shiver, Rph []  Rema Fendt) Glennon Mac, PharmD []  Arlyn Dunning, PharmD []  Netta Cedars, PharmD []  Dia Sitter, PharmD []  Leone Haven, PharmD []  Gretta Arab, PharmD []  Theodis Shove, PharmD []  Peggyann Juba, PharmD []  Reuel Boom, PharmD   Positive urine culture Treated with Nitrofurantoin, organism sensitive to the same and no further patient follow-up is required at this time.  Larene Beach Johnie Makki 02/19/2019, 1:00 PM

## 2019-05-25 ENCOUNTER — Emergency Department (HOSPITAL_BASED_OUTPATIENT_CLINIC_OR_DEPARTMENT_OTHER): Payer: HRSA Program

## 2019-05-25 ENCOUNTER — Encounter (HOSPITAL_BASED_OUTPATIENT_CLINIC_OR_DEPARTMENT_OTHER): Payer: Self-pay | Admitting: *Deleted

## 2019-05-25 ENCOUNTER — Inpatient Hospital Stay (HOSPITAL_BASED_OUTPATIENT_CLINIC_OR_DEPARTMENT_OTHER)
Admission: EM | Admit: 2019-05-25 | Discharge: 2019-05-30 | DRG: 177 | Disposition: A | Discharge status: Home / Self Care | Payer: HRSA Program | Attending: Internal Medicine | Admitting: Internal Medicine

## 2019-05-25 ENCOUNTER — Other Ambulatory Visit: Payer: Self-pay

## 2019-05-25 DIAGNOSIS — Z6841 Body Mass Index (BMI) 40.0 and over, adult: Secondary | ICD-10-CM

## 2019-05-25 DIAGNOSIS — J1282 Pneumonia due to coronavirus disease 2019: Secondary | ICD-10-CM | POA: Diagnosis present

## 2019-05-25 DIAGNOSIS — Z87891 Personal history of nicotine dependence: Secondary | ICD-10-CM

## 2019-05-25 DIAGNOSIS — B342 Coronavirus infection, unspecified: Secondary | ICD-10-CM

## 2019-05-25 DIAGNOSIS — U071 COVID-19: Secondary | ICD-10-CM | POA: Diagnosis not present

## 2019-05-25 DIAGNOSIS — Z791 Long term (current) use of non-steroidal anti-inflammatories (NSAID): Secondary | ICD-10-CM

## 2019-05-25 DIAGNOSIS — J1289 Other viral pneumonia: Secondary | ICD-10-CM | POA: Diagnosis present

## 2019-05-25 DIAGNOSIS — Z8249 Family history of ischemic heart disease and other diseases of the circulatory system: Secondary | ICD-10-CM

## 2019-05-25 DIAGNOSIS — M25569 Pain in unspecified knee: Secondary | ICD-10-CM | POA: Diagnosis present

## 2019-05-25 DIAGNOSIS — J9600 Acute respiratory failure, unspecified whether with hypoxia or hypercapnia: Secondary | ICD-10-CM | POA: Diagnosis present

## 2019-05-25 DIAGNOSIS — J9601 Acute respiratory failure with hypoxia: Secondary | ICD-10-CM | POA: Diagnosis present

## 2019-05-25 DIAGNOSIS — T380X5A Adverse effect of glucocorticoids and synthetic analogues, initial encounter: Secondary | ICD-10-CM | POA: Diagnosis present

## 2019-05-25 DIAGNOSIS — R102 Pelvic and perineal pain: Secondary | ICD-10-CM | POA: Diagnosis present

## 2019-05-25 DIAGNOSIS — R109 Unspecified abdominal pain: Secondary | ICD-10-CM

## 2019-05-25 DIAGNOSIS — Z79899 Other long term (current) drug therapy: Secondary | ICD-10-CM

## 2019-05-25 DIAGNOSIS — G8929 Other chronic pain: Secondary | ICD-10-CM | POA: Diagnosis present

## 2019-05-25 DIAGNOSIS — R0902 Hypoxemia: Secondary | ICD-10-CM

## 2019-05-25 LAB — CBC WITH DIFFERENTIAL/PLATELET
Abs Immature Granulocytes: 0.01 10*3/uL (ref 0.00–0.07)
Basophils Absolute: 0 10*3/uL (ref 0.0–0.1)
Basophils Relative: 0 %
Eosinophils Absolute: 0 10*3/uL (ref 0.0–0.5)
Eosinophils Relative: 0 %
HCT: 44.8 % (ref 36.0–46.0)
Hemoglobin: 13.9 g/dL (ref 12.0–15.0)
Immature Granulocytes: 0 %
Lymphocytes Relative: 31 %
Lymphs Abs: 2.1 10*3/uL (ref 0.7–4.0)
MCH: 25.8 pg — ABNORMAL LOW (ref 26.0–34.0)
MCHC: 31 g/dL (ref 30.0–36.0)
MCV: 83.1 fL (ref 80.0–100.0)
Monocytes Absolute: 0.4 10*3/uL (ref 0.1–1.0)
Monocytes Relative: 6 %
Neutro Abs: 4.3 10*3/uL (ref 1.7–7.7)
Neutrophils Relative %: 63 %
Platelets: 240 10*3/uL (ref 150–400)
RBC: 5.39 MIL/uL — ABNORMAL HIGH (ref 3.87–5.11)
RDW: 14.9 % (ref 11.5–15.5)
WBC: 6.9 10*3/uL (ref 4.0–10.5)
nRBC: 0 % (ref 0.0–0.2)

## 2019-05-25 LAB — COMPREHENSIVE METABOLIC PANEL
ALT: 27 U/L (ref 0–44)
AST: 34 U/L (ref 15–41)
Albumin: 3.8 g/dL (ref 3.5–5.0)
Alkaline Phosphatase: 57 U/L (ref 38–126)
Anion gap: 10 (ref 5–15)
BUN: 10 mg/dL (ref 6–20)
CO2: 26 mmol/L (ref 22–32)
Calcium: 8.2 mg/dL — ABNORMAL LOW (ref 8.9–10.3)
Chloride: 103 mmol/L (ref 98–111)
Creatinine, Ser: 0.81 mg/dL (ref 0.44–1.00)
GFR calc Af Amer: >60 mL/min (ref >60)
GFR calc non Af Amer: >60 mL/min (ref >60)
Glucose, Bld: 97 mg/dL (ref 70–99)
Potassium: 3.3 mmol/L — ABNORMAL LOW (ref 3.5–5.1)
Sodium: 139 mmol/L (ref 135–145)
Total Bilirubin: 0.7 mg/dL (ref 0.3–1.2)
Total Protein: 8.1 g/dL (ref 6.5–8.1)

## 2019-05-25 LAB — LACTIC ACID, PLASMA: Lactic Acid, Venous: 1.2 mmol/L (ref 0.5–1.9)

## 2019-05-25 LAB — SARS CORONAVIRUS 2 AG (30 MIN TAT): SARS Coronavirus 2 Ag: POSITIVE — AB

## 2019-05-25 MED ORDER — ACETAMINOPHEN 325 MG PO TABS: 650.0000 mg | ORAL_TABLET | Freq: Once | ORAL | Status: DC | PRN

## 2019-05-25 MED ORDER — ALBUTEROL SULFATE HFA 108 (90 BASE) MCG/ACT IN AERS
8.0000 | INHALATION_SPRAY | RESPIRATORY_TRACT | Status: DC | PRN
  Filled 2019-05-25: qty 6.7

## 2019-05-25 MED ORDER — ALBUTEROL SULFATE HFA 108 (90 BASE) MCG/ACT IN AERS: 8.0000 | INHALATION_SPRAY | Freq: Once | RESPIRATORY_TRACT | Status: DC

## 2019-05-25 MED ORDER — ACETAMINOPHEN 500 MG PO TABS
1000.0000 mg | ORAL_TABLET | Freq: Once | ORAL | Status: AC
  Administered 2019-05-25: 1000 mg via ORAL
  Filled 2019-05-25: qty 2

## 2019-05-25 NOTE — ED Provider Notes (Addendum)
11:15 PM  Assumed care from Dr. Maryan Rued.  Patient is a 38 y.o. F with obesity who presents to ED with 4 days of URI symptoms.  She was tachypneic and sats in low 90s when arrived.  CXR shows bilateral GGOs.  Concern for COVID.  Labs and swab pending.  Will admit.  12:20 AM  Pt is COVID positive.  No leukocytosis.  Normal hemoglobin.  Electrolytes normal.  Lactate normal.  Laboratory markers pending.  Will discuss with medicine for admission.  She has not received antibiotics given this is a viral pneumonia.  Patient has been updated with this plan.  12:36 AM Discussed patient's case with hospitalist, Dr. Sloan Leiter.  I have recommended admission and patient (and family if present) agree with this plan. Admitting physician will place admission orders.   I reviewed all nursing notes, vitals, pertinent previous records, EKGs, lab and urine results, imaging (as available).  6:40 AM  Pt stable and awaiting [facility].  She has been intermittently hypoxic here but this is because she takes her oxygen off because it is causing her discomfort.  She has been encouraged several times to leave her nasal cannula in place.  Sats will drop to the mid to upper 80s off of oxygen at rest.   [name] was evaluated in Emergency Department on 05/26/2019 for the symptoms described in the history of present illness. She was evaluated in the context of the global COVID-19 pandemic, which necessitated consideration that the patient might be at risk for infection with the SARS-CoV-2 virus that causes COVID-19. Institutional protocols and algorithms that pertain to the evaluation of patients at risk for COVID-19 are in a state of rapid change based on information released by regulatory bodies including the CDC and federal and state organizations. These policies and algorithms were followed during the patient's care in the ED.    Glendi Mohiuddin, Delice Bison, DO 05/26/19 0037    Vince Ainsley, Delice Bison,  DO 05/26/19 [phone]

## 2019-05-25 NOTE — ED Triage Notes (Signed)
Pt reports feeling bad x 3 days, subjective fever, dry cough, increasing SOB

## 2019-05-25 NOTE — ED Provider Notes (Signed)
[facility] Provider Note   CSN: [id number] Arrival date & time: 05/25/19  2141     History   Chief Complaint Chief Complaint  Patient presents with  . Shortness of Breath    FEVER    HPI Kristin Boyd is a 38 y.o. female.     The history is provided by the patient.  Shortness of Breath Severity:  Severe Onset quality:  Gradual Duration:  4 days Timing:  Constant Progression:  Worsening Chronicity:  New Context: URI   Relieved by:  Nothing Worsened by:  Activity Ineffective treatments:  Rest Associated symptoms: cough, fever and headaches   Associated symptoms: no abdominal pain, no chest pain, no sputum production, no vomiting and no wheezing   Associated symptoms comment:  No leg swelling.  Pt does not work outside of the home and no known sick contacts. Risk factors: obesity   Risk factors comment:  Non-smoker and no known med problems    Past Medical History:  Diagnosis Date  . Chronic knee pain   . Chronic pelvic pain in female   . Obese     There are no active problems to display for this patient.   Past Surgical History:  Procedure Laterality Date  . CESAREAN SECTION    . CHOLECYSTECTOMY    . ENDOMETRIAL ABLATION       OB History   No obstetric history on file.      Home Medications    Prior to Admission medications   Medication Sig Start Date End Date Taking? Authorizing Provider  naproxen (NAPROSYN) 500 MG tablet Take 1 tablet (500 mg total) by mouth 2 (two) times daily. 11/01/18   Horton, Barbette Hair, MD  nitrofurantoin, macrocrystal-monohydrate, (MACROBID) 100 MG capsule Take 1 capsule (100 mg total) by mouth 2 (two) times daily. 02/16/19   Molpus, John, MD    Family History No family history on file.  Social History Social History   Tobacco Use  . Smoking status: Former Research scientist (life sciences)  . Smokeless tobacco: Never Used  Substance Use Topics  . Alcohol use: Not Currently    Comment: occasional  . Drug  use: Never     Allergies   Penicillins   Review of Systems Review of Systems  Constitutional: Positive for fever.  Respiratory: Positive for cough and shortness of breath. Negative for sputum production and wheezing.   Cardiovascular: Negative for chest pain.  Gastrointestinal: Negative for abdominal pain and vomiting.  Neurological: Positive for headaches.  All other systems reviewed and are negative.    Physical Exam Updated Vital Signs BP 138/78 (BP Location: Right Arm)   Pulse 93   Temp (!) 101.2 F (38.4 C) (Oral)   Resp (!) 26   Ht 5\' 9"  (1.753 m)   Wt (!) 184.2 kg   SpO2 92%   BMI 59.96 kg/m   Physical Exam Vitals signs and nursing note reviewed.  Constitutional:      General: She is not in acute distress.    Appearance: She is well-developed. She is obese.  HENT:     Head: Normocephalic and atraumatic.  Eyes:     Pupils: Pupils are equal, round, and reactive to light.  Cardiovascular:     Rate and Rhythm: Normal rate and regular rhythm.     Heart sounds: Normal heart sounds. No murmur. No friction rub.  Pulmonary:     Effort: Pulmonary effort is normal. Tachypnea present.     Breath sounds: Decreased breath sounds present.  No wheezing or rales.     Comments: Mild crackles at the bases Abdominal:     General: Bowel sounds are normal. There is no distension.     Palpations: Abdomen is soft.     Tenderness: There is no abdominal tenderness. There is no guarding or rebound.  Musculoskeletal: Normal range of motion.        General: No tenderness.     Right lower leg: No edema.     Left lower leg: No edema.     Comments: No edema  Skin:    General: Skin is warm and dry.     Findings: No rash.  Neurological:     General: No focal deficit present.     Mental Status: She is alert and oriented to person, place, and time. Mental status is at baseline.     Cranial Nerves: No cranial nerve deficit.  Psychiatric:        Mood and Affect: Mood normal.         Behavior: Behavior normal.        Thought Content: Thought content normal.      ED Treatments / Results  Labs (all labs ordered are listed, but only abnormal results are displayed) Labs Reviewed  CULTURE, BLOOD (ROUTINE X 2)  CULTURE, BLOOD (ROUTINE X 2)  SARS CORONAVIRUS 2 (HOSP ORDER, PERFORMED IN [facility] VIA ABBOTT ID)  LACTIC ACID, PLASMA  LACTIC ACID, PLASMA  CBC WITH DIFFERENTIAL/PLATELET  COMPREHENSIVE METABOLIC PANEL  D-DIMER, QUANTITATIVE (NOT AT [facility])  PROCALCITONIN  LACTATE DEHYDROGENASE  FERRITIN  TRIGLYCERIDES  FIBRINOGEN  C-REACTIVE PROTEIN  PREGNANCY, URINE    EKG    Radiology Dg Chest Port 1 View  Result Date: 05/25/2019 CLINICAL DATA:  Shortness of breath, cough and fever EXAM: PORTABLE CHEST 1 VIEW COMPARISON:  07/29/2015 FINDINGS: Mild cardiomegaly. Patchy bilateral interstitial and ground-glass opacity. No pneumothorax. IMPRESSION: 1. Patchy bilateral interstitial and ground-glass opacity, consider atypical or viral pneumonia 2. Mild cardiomegaly Electronically Signed   By: Donavan Foil M.D.   On: 05/25/2019 22:31    Procedures Procedures (including critical care time)  Medications Ordered in ED Medications  albuterol (VENTOLIN HFA) 108 (90 Base) MCG/ACT inhaler 8 puff (has no administration in time range)  acetaminophen (TYLENOL) tablet 1,000 mg (has no administration in time range)  acetaminophen (TYLENOL) tablet 650 mg (has no administration in time range)     Initial Impression / Assessment and Plan / ED Course  I have reviewed the triage vital signs and the nursing notes.  Pertinent labs & imaging results that were available during my care of the patient were reviewed by me and considered in my medical decision making (see chart for details).        Kristin Boyd was evaluated in Emergency Department on 05/25/2019 for the symptoms described in the history of present illness. She was evaluated in the context of the global  COVID-19 pandemic, which necessitated consideration that the patient might be at risk for infection with the SARS-CoV-2 virus that causes COVID-19. Institutional protocols and algorithms that pertain to the evaluation of patients at risk for COVID-19 are in a state of rapid change based on information released by regulatory bodies including the CDC and federal and state organizations. These policies and algorithms were followed during the patient's care in the ED. Pt upon arrival with fever, tachypnea and mild hypoxia to the high 80's and low 90's.  Pt has no chest pain or abd pain or sx of  PE, ACS or CHF.    Final Clinical Impressions(s) / ED Diagnoses   Final diagnoses:  None    ED Discharge Orders    None       Blanchie Dessert, MD 05/25/19 2325

## 2019-05-26 ENCOUNTER — Inpatient Hospital Stay: Payer: Self-pay

## 2019-05-26 DIAGNOSIS — R109 Unspecified abdominal pain: Secondary | ICD-10-CM | POA: Diagnosis not present

## 2019-05-26 DIAGNOSIS — J1282 Pneumonia due to coronavirus disease 2019: Secondary | ICD-10-CM | POA: Diagnosis present

## 2019-05-26 DIAGNOSIS — Z79899 Other long term (current) drug therapy: Secondary | ICD-10-CM | POA: Diagnosis not present

## 2019-05-26 DIAGNOSIS — Z6841 Body Mass Index (BMI) 40.0 and over, adult: Secondary | ICD-10-CM | POA: Diagnosis not present

## 2019-05-26 DIAGNOSIS — R102 Pelvic and perineal pain: Secondary | ICD-10-CM | POA: Diagnosis present

## 2019-05-26 DIAGNOSIS — J9601 Acute respiratory failure with hypoxia: Secondary | ICD-10-CM | POA: Diagnosis not present

## 2019-05-26 DIAGNOSIS — Z87891 Personal history of nicotine dependence: Secondary | ICD-10-CM | POA: Diagnosis not present

## 2019-05-26 DIAGNOSIS — U071 COVID-19: Secondary | ICD-10-CM

## 2019-05-26 DIAGNOSIS — T380X5A Adverse effect of glucocorticoids and synthetic analogues, initial encounter: Secondary | ICD-10-CM | POA: Diagnosis present

## 2019-05-26 DIAGNOSIS — B342 Coronavirus infection, unspecified: Secondary | ICD-10-CM | POA: Diagnosis not present

## 2019-05-26 DIAGNOSIS — Z8249 Family history of ischemic heart disease and other diseases of the circulatory system: Secondary | ICD-10-CM | POA: Diagnosis not present

## 2019-05-26 DIAGNOSIS — M25569 Pain in unspecified knee: Secondary | ICD-10-CM | POA: Diagnosis present

## 2019-05-26 DIAGNOSIS — G8929 Other chronic pain: Secondary | ICD-10-CM | POA: Diagnosis present

## 2019-05-26 DIAGNOSIS — J1289 Other viral pneumonia: Secondary | ICD-10-CM

## 2019-05-26 DIAGNOSIS — Z791 Long term (current) use of non-steroidal anti-inflammatories (NSAID): Secondary | ICD-10-CM | POA: Diagnosis not present

## 2019-05-26 DIAGNOSIS — J9600 Acute respiratory failure, unspecified whether with hypoxia or hypercapnia: Secondary | ICD-10-CM | POA: Diagnosis present

## 2019-05-26 HISTORY — DX: Pneumonia due to coronavirus disease 2019: J12.82

## 2019-05-26 HISTORY — DX: COVID-19: U07.1

## 2019-05-26 LAB — TRIGLYCERIDES: Triglycerides: 62 mg/dL (ref <150)

## 2019-05-26 LAB — FERRITIN
Ferritin: 166 ng/mL (ref 11–307)
Ferritin: 178 ng/mL (ref 11–307)

## 2019-05-26 LAB — PROCALCITONIN
Procalcitonin: <0.1 ng/mL
Procalcitonin: <0.1 ng/mL

## 2019-05-26 LAB — ABO/RH: ABO/RH(D): O POS

## 2019-05-26 LAB — C-REACTIVE PROTEIN
CRP: 6.8 mg/dL — ABNORMAL HIGH (ref <1.0)
CRP: 5.6 mg/dL — ABNORMAL HIGH (ref <1.0)

## 2019-05-26 LAB — LACTATE DEHYDROGENASE
LDH: 330 U/L — ABNORMAL HIGH (ref 98–192)
LDH: 313 U/L — ABNORMAL HIGH (ref 98–192)

## 2019-05-26 LAB — D-DIMER, QUANTITATIVE
D-Dimer, Quant: 0.69 ug/mL-FEU — ABNORMAL HIGH (ref 0.00–0.50)
D-Dimer, Quant: 0.82 ug/mL-FEU — ABNORMAL HIGH (ref 0.00–0.50)

## 2019-05-26 LAB — PREGNANCY, URINE: Preg Test, Ur: NEGATIVE

## 2019-05-26 LAB — FIBRINOGEN: Fibrinogen: 569 mg/dL — ABNORMAL HIGH (ref 210–475)

## 2019-05-26 MED ORDER — ENOXAPARIN SODIUM 100 MG/ML ~~LOC~~ SOLN
90.0000 mg | SUBCUTANEOUS | Status: DC
  Administered 2019-05-26 – 2019-05-29 (×4): 90 mg via SUBCUTANEOUS
  Filled 2019-05-26 (×5): qty 0.9

## 2019-05-26 MED ORDER — SODIUM CHLORIDE 0.9 % IV SOLN
200.0000 mg | Freq: Once | INTRAVENOUS | Status: AC
  Administered 2019-05-26: 18:00:00 200 mg via INTRAVENOUS
  Filled 2019-05-26 (×2): qty 40

## 2019-05-26 MED ORDER — METHYLPREDNISOLONE SODIUM SUCC 125 MG IJ SOLR
80.0000 mg | Freq: Three times a day (TID) | INTRAMUSCULAR | Status: DC
  Administered 2019-05-26 – 2019-05-27 (×3): 80 mg via INTRAVENOUS
  Filled 2019-05-26 (×3): qty 2

## 2019-05-26 MED ORDER — FAMOTIDINE 20 MG PO TABS
20.0000 mg | ORAL_TABLET | Freq: Two times a day (BID) | ORAL | Status: DC
  Administered 2019-05-26 – 2019-05-30 (×8): 20 mg via ORAL
  Filled 2019-05-26 (×8): qty 1

## 2019-05-26 MED ORDER — IPRATROPIUM BROMIDE HFA 17 MCG/ACT IN AERS
2.0000 | INHALATION_SPRAY | Freq: Four times a day (QID) | RESPIRATORY_TRACT | Status: DC
  Administered 2019-05-26 – 2019-05-28 (×7): 2 via RESPIRATORY_TRACT
  Filled 2019-05-26: qty 12.9

## 2019-05-26 MED ORDER — SODIUM CHLORIDE 0.9 % IV SOLN
100.0000 mg | INTRAVENOUS | Status: AC
  Administered 2019-05-27 – 2019-05-30 (×4): 100 mg via INTRAVENOUS
  Filled 2019-05-26 (×4): qty 20

## 2019-05-26 MED ORDER — GUAIFENESIN-DM 100-10 MG/5ML PO SYRP
10.0000 mL | ORAL_SOLUTION | ORAL | Status: DC | PRN
  Administered 2019-05-26: 10 mL via ORAL
  Filled 2019-05-26: qty 10

## 2019-05-26 MED ORDER — ZINC SULFATE 220 (50 ZN) MG PO CAPS
220.0000 mg | ORAL_CAPSULE | Freq: Every day | ORAL | Status: DC
  Administered 2019-05-26 – 2019-05-30 (×5): 220 mg via ORAL
  Filled 2019-05-26 (×5): qty 1

## 2019-05-26 MED ORDER — VITAMIN C 500 MG PO TABS
500.0000 mg | ORAL_TABLET | Freq: Every day | ORAL | Status: DC
  Administered 2019-05-26 – 2019-05-30 (×5): 500 mg via ORAL
  Filled 2019-05-26 (×5): qty 1

## 2019-05-26 MED ORDER — DOCUSATE SODIUM 100 MG PO CAPS
100.0000 mg | ORAL_CAPSULE | Freq: Two times a day (BID) | ORAL | Status: DC
  Administered 2019-05-26 – 2019-05-29 (×5): 100 mg via ORAL
  Filled 2019-05-26 (×6): qty 1

## 2019-05-26 MED ORDER — ACETAMINOPHEN 325 MG PO TABS
650.0000 mg | ORAL_TABLET | Freq: Four times a day (QID) | ORAL | Status: DC | PRN
  Administered 2019-05-27 – 2019-05-29 (×3): 650 mg via ORAL
  Filled 2019-05-26 (×3): qty 2

## 2019-05-26 MED ORDER — SODIUM CHLORIDE 0.9 % IV SOLN
INTRAVENOUS | Status: DC | PRN
  Administered 2019-05-26: 18:00:00 via INTRAVENOUS
  Administered 2019-05-27: 250 mL via INTRAVENOUS

## 2019-05-26 NOTE — ED Notes (Signed)
PT still unable to give urine sample.

## 2019-05-26 NOTE — ED Notes (Signed)
Explained delay to pt regarding transfer and explained they should be here to get her shortly after shift change  Comfort measures performed  Apple juice given

## 2019-05-26 NOTE — ED Notes (Signed)
Report received from [facility]

## 2019-05-26 NOTE — ED Notes (Signed)
ED TO INPATIENT HANDOFF REPORT  ED Nurse Name and Phone #: Jenny Reichmann [phone]  S Name/Age/Gender Kristin Boyd 38 y.o. female Room/Bed: MH11/MH11  Code Status   Code Status: Not on file  Home/SNF/Other Home Patient oriented to: self, place, time and situation Is this baseline? Yes   Triage Complete: Triage complete  Chief Complaint shortness of breath, bronchitis  Triage Note Pt reports feeling bad x 3 days, subjective fever, dry cough, increasing SOB   Allergies Allergies  Allergen Reactions  . Penicillins Anaphylaxis    Has patient had a PCN reaction causing immediate rash, facial/tongue/throat swelling, SOB or lightheadedness with hypotension: Yes Has patient had a PCN reaction causing severe rash involving mucus membranes or skin necrosis: Unknown Has patient had a PCN reaction that required hospitalization: Yes Has patient had a PCN reaction occurring within the last 10 years: Yes If all of the above answers are "NO", then may proceed with Cephalosporin use.    Level of Care/Admitting Diagnosis ED Disposition    ED Disposition Condition [facility] Area: [place] [[id number]]  Level of Care: Telemetry [5]  Covid Evaluation: Confirmed COVID Positive  Isolation Risk Level: High Risk/Airborne (Aerosolizing procedure, nebulizer, intubated/ventilation, CPAP/BiPAP)  Diagnosis: Pneumonia due to COVID-19 virus [[id number]]  Admitting Physician: Barb Merino [[id number]]  Attending Physician: Barb Merino [[id number]]  Estimated length of stay: past midnight tomorrow  Certification:: I certify this patient will need inpatient services for at least 2 midnights  PT Class (Do Not Modify): Inpatient [101]  PT Acc Code (Do Not Modify): Private [1]       B Medical/Surgery History Past Medical History:  Diagnosis Date  . Chronic knee pain   . Chronic pelvic pain in female   . Obese    Past Surgical History:  Procedure Laterality  Date  . CESAREAN SECTION    . CHOLECYSTECTOMY    . ENDOMETRIAL ABLATION       A IV Location/Drains/Wounds Patient Lines/Drains/Airways Status   Active Line/Drains/Airways    Name:   Placement date:   Placement time:   Site:   Days:   Peripheral IV 05/25/19 Right Antecubital   05/25/19    2240    Antecubital   1          Intake/Output Last 24 hours No intake or output data in the 24 hours ending 05/26/19 0932  Labs/Imaging Results for orders placed or performed during the hospital encounter of 05/25/19 (from the past 48 hour(s))  Lactic acid, plasma     Status: None   Collection Time: 05/25/19 10:42 PM  Result Value Ref Range   Lactic Acid, Venous 1.2 0.5 - 1.9 mmol/L    Comment: Performed at [facility], [address] [place], [place] [address]  CBC WITH DIFFERENTIAL     Status: Abnormal   Collection Time: 05/25/19 10:42 PM  Result Value Ref Range   WBC 6.9 4.0 - 10.5 K/uL   RBC 5.39 (H) 3.87 - 5.11 MIL/uL   Hemoglobin 13.9 12.0 - 15.0 g/dL   HCT 44.8 36.0 - 46.0 %   MCV 83.1 80.0 - 100.0 fL   MCH 25.8 (L) 26.0 - 34.0 pg   MCHC 31.0 30.0 - 36.0 g/dL   RDW 14.9 11.5 - 15.5 %   Platelets 240 150 - 400 K/uL   nRBC 0.0 0.0 - 0.2 %   Neutrophils Relative % 63 %   Neutro Abs 4.3 1.7 - 7.7 K/uL  Lymphocytes Relative 31 %   Lymphs Abs 2.1 0.7 - 4.0 K/uL   Monocytes Relative 6 %   Monocytes Absolute 0.4 0.1 - 1.0 K/uL   Eosinophils Relative 0 %   Eosinophils Absolute 0.0 0.0 - 0.5 K/uL   Basophils Relative 0 %   Basophils Absolute 0.0 0.0 - 0.1 K/uL   Immature Granulocytes 0 %   Abs Immature Granulocytes 0.01 0.00 - 0.07 K/uL    Comment: Performed at [facility], [address] [place], [place] [address]  Comprehensive metabolic panel     Status: Abnormal   Collection Time: 05/25/19 10:42 PM  Result Value Ref Range   Sodium 139 135 - 145 mmol/L   Potassium 3.3 (L) 3.5 - 5.1 mmol/L   Chloride 103 98 - 111 mmol/L   CO2 26 22 - 32 mmol/L    Glucose, Bld 97 70 - 99 mg/dL   BUN 10 6 - 20 mg/dL   Creatinine, Ser 0.81 0.44 - 1.00 mg/dL   Calcium 8.2 (L) 8.9 - 10.3 mg/dL   Total Protein 8.1 6.5 - 8.1 g/dL   Albumin 3.8 3.5 - 5.0 g/dL   AST 34 15 - 41 U/L   ALT 27 0 - 44 U/L   Alkaline Phosphatase 57 38 - 126 U/L   Total Bilirubin 0.7 0.3 - 1.2 mg/dL   GFR calc non Af Amer >60 >60 mL/min   GFR calc Af Amer >60 >60 mL/min   Anion gap 10 5 - 15    Comment: Performed at [facility], [address] [place], [place] [address]  D-dimer, quantitative     Status: Abnormal   Collection Time: 05/25/19 10:42 PM  Result Value Ref Range   D-Dimer, Quant 0.69 (H) 0.00 - 0.50 ug/mL-FEU    Comment: Performed at [facility], [address] [place], [place] [address]  Procalcitonin     Status: None   Collection Time: 05/25/19 10:42 PM  Result Value Ref Range   Procalcitonin <0.10 ng/mL    Comment: REPEATED TO VERIFY Interpretation: PCT (Procalcitonin) <= 0.5 ng/mL: Systemic infection (sepsis) is not likely. Local bacterial infection is possible. (NOTE)       Sepsis PCT Algorithm           Lower Respiratory Tract                                      Infection PCT Algorithm    ----------------------------     ----------------------------         PCT < 0.25 ng/mL                PCT < 0.10 ng/mL         Strongly encourage             Strongly discourage   discontinuation of antibiotics    initiation of antibiotics    ----------------------------     -----------------------------       PCT 0.25 - 0.50 ng/mL            PCT 0.10 - 0.25 ng/mL               OR       >80% decrease in PCT            Discourage initiation of  antibiotics      Encourage discontinuation           of antibiotics    ----------------------------     -----------------------------         PCT >= 0.50 ng/mL              PCT 0.26 - 0.50 ng/mL                AND       <80% decrease in PCT              Encourage initiation of                                             antibiotics       Encourage continuation           of antibiotics    ----------------------------     -----------------------------        PCT >= 0.50 ng/mL                  PCT > 0.50 ng/mL               AND         increase in PCT                  Strongly encourage                                      initiation of antibiotics    Strongly encourage escalation           of antibiotics                                     -----------------------------                                           PCT <= 0.25 ng/mL                                                 OR                                        > 80% decrease in PCT                                     Discontinue / Do not initiate                                             antibiotics Performed at [facility], [place] [address] [place], [place] [address]   Lactate dehydrogenase     Status: Abnormal   Collection Time: 05/25/19  10:42 PM  Result Value Ref Range   LDH 330 (H) 98 - 192 U/L    Comment: Performed at [facility], [place] [address], [place], [place] [address]  Triglycerides     Status: None   Collection Time: 05/25/19 10:42 PM  Result Value Ref Range   Triglycerides 62 <150 mg/dL    Comment: Performed at [facility] [address], [place], [place] [address]  Fibrinogen     Status: Abnormal   Collection Time: 05/25/19 10:42 PM  Result Value Ref Range   Fibrinogen 569 (H) 210 - 475 mg/dL    Comment: Performed at [facility] [address] [place], [place] [address]  SARS Coronavirus 2 (Hosp order,Performed in [facility] lab via Abbott ID)     Status: Abnormal   Collection Time: 05/25/19 10:42 PM   Specimen: Dry Nasal Swab (Abbott ID Now)  Result Value Ref Range   SARS Coronavirus 2 (Abbott ID Now) POSITIVE (A) NEGATIVE    Comment: RESULT CALLED TO, READ BACK BY AND VERIFIED WITH: ADKINS LISA RN AT 2339 ON 05/25/19 BY  [email or web address] (NOTE) Interpretive Result Comment(s): COVID 19 Positive SARS CoV 2 target nucleic acids are DETECTED. The SARS CoV 2 RNA is generally detectable in upper and lower respiratory specimens during the acute phase of infection.  Positive results are indicative of active infection with SARS CoV 2.  Clinical correlation with patient history and other diagnostic information is necessary to determine patient infection status.  Positive results do not rule out bacterial infection or coinfection with other viruses. The expected result is Negative. COVID 19 Negative SARS CoV 2 target nucleic acids are NOT DETECTED. The SARS CoV 2 RNA is generally detectable in upper and lower respiratory specimens during the acute phase of infection.  Negative results do not preclude SARS CoV 2 infection, do not rule out coinfections with other pathogens, and should not be used as the sole basis for t reatment or other patient management decisions.  Negative results must be combined with clinical observations, patient history, and epidemiological information. The expected result is Negative. Invalid Presence or absence of SARS CoV 2 nucleic acids cannot be determined. Repeat testing was performed on the submitted specimen and repeated Invalid results were obtained.  If clinically indicated, additional testing on a new specimen with an alternate test methodology [phone]) is advised.  The SARS CoV 2 RNA is generally detectable in upper and lower respiratory specimens during the acute phase of infection. The expected result is Negative. Fact Sheet for Patients:  GolfingFamily.no Fact Sheet for Healthcare Providers: https://www.hernandez-brewer.com/ This test is not yet approved or cleared by the [place] FDA and has been authorized for detection and/or diagnosis of SARS CoV 2 by FDA under an Emergency Use Authorization (EUA).  T his EUA will remain in effect  (meaning this test can be used) for the duration of the COVID19 declaration under Section 564(b)(1) of the Act, 21 U.S.C. section 415 685 2468 3(b)(1), unless the authorization is terminated or revoked sooner. Performed at [facility], [address] [place], [place] [address]   Ferritin     Status: None   Collection Time: 05/25/19 10:43 PM  Result Value Ref Range   Ferritin 166 11 - 307 ng/mL    Comment: Performed at [facility], [place] [address] [place], [place] [address]  C-reactive protein     Status: Abnormal   Collection Time: 05/25/19 10:43 PM  Result  Value Ref Range   CRP 6.8 (H) <1.0 mg/dL    Comment: Performed at [facility] [address] [place], [place] [address]   Dg Chest Port 1 View  Result Date: 05/25/2019 CLINICAL DATA:  Shortness of breath, cough and fever EXAM: PORTABLE CHEST 1 VIEW COMPARISON:  07/29/2015 FINDINGS: Mild cardiomegaly. Patchy bilateral interstitial and ground-glass opacity. No pneumothorax. IMPRESSION: 1. Patchy bilateral interstitial and ground-glass opacity, consider atypical or viral pneumonia 2. Mild cardiomegaly Electronically Signed   By: Donavan Foil M.D.   On: 05/25/2019 22:31    Pending Labs Unresulted Labs (From admission, onward)    Start     Ordered   05/25/19 2212  Blood Culture (routine x 2)  BLOOD CULTURE X 2,   STAT    Question:  Patient immune status  Answer:  Normal   05/25/19 2212   05/25/19 2212  Pregnancy, urine  Once,   STAT     05/25/19 2212          Vitals/Pain Today's Vitals   05/26/19 0700 05/26/19 0729 05/26/19 0800 05/26/19 0830  BP: 116/69  113/78 124/89  Pulse: 78  77 72  Resp: (!) 23  (!) 21 (!) 25  Temp:  98.2 F (36.8 C)    TempSrc:  Oral    SpO2: 97%  95% 97%  Weight:      Height:      PainSc:        Isolation Precautions No active isolations  Medications Medications  albuterol (VENTOLIN HFA) 108 (90 Base) MCG/ACT inhaler 8 puff (has no administration in time range)   acetaminophen (TYLENOL) tablet 650 mg (has no administration in time range)  acetaminophen (TYLENOL) tablet 1,000 mg (1,000 mg Oral Given 05/25/19 2301)    Mobility walks Low fall risk   Focused Assessments Pulmonary Assessment Handoff:  Lung sounds: L Breath Sounds: Diminished R Breath Sounds: Diminished O2 Device: Nasal Cannula O2 Flow Rate (L/min): 3 L/min      R Recommendations: See Admitting Provider Note  Report given to:   Additional Notes: none

## 2019-05-26 NOTE — ED Notes (Signed)
Instructed patient to put oxygen back in nose. Pt reports that it was giving her a headache. Advised we were awaiting transport.

## 2019-05-26 NOTE — Progress Notes (Signed)
She is morbidly obese at 184kg. D/w Dr Waldron Labs and we will increase the dose to 0.5mg /kg/day = 90mg .  Onnie Boer, PharmD, BCIDP, AAHIVP, CPP Infectious Disease Pharmacist 05/26/2019 3:26 PM

## 2019-05-26 NOTE — H&P (Signed)
TRH H&P   Patient Demographics:    Kristin Boyd, is a 38 y.o. female  MRN: [id number]   DOB - [birth year]  Admit Date - 05/25/2019  Outpatient Primary MD for the patient is Patient, No Pcp Per  Referring MD/NP/PA: [facility]  Patient coming from: Home  Chief Complaint  Patient presents with  . Shortness of Breath    FEVER      HPI:    Kristin Boyd  is a 38 y.o. 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 -In ED patient was hypoxic in high 80s at rest, requiring oxygen, on presentation she was on 4 L nasal cannula, I was able to wean her to 9 L nasal cannula, chest x-ray with bilateral opacities, she has elevated CRP, D-dimers, was positive for COVID-19, she was transferred to [facility] for further management.    Review of systems:    In addition to the HPI above,  Reports Fever-chills, No Headache, No changes with Vision or hearing, No problems swallowing food or Liquids, No Chest pain, reports Cough and Shortness of Breath, No Abdominal pain, No Nausea or Vommitting, Bowel movements are regular, No Blood in stool or Urine, No dysuria, No new skin rashes or bruises, No new joints pains-aches,  No new weakness, tingling, numbness in any extremity, No recent weight gain or loss, No polyuria, polydypsia or polyphagia, No significant Mental Stressors.  A full 10 point Review of Systems  was done, except as stated above, all other Review of Systems were negative.   With Past History of the following :    Past Medical History:  Diagnosis Date  . Chronic knee pain   . Chronic pelvic pain in female   . Obese       Past Surgical History:  Procedure Laterality Date  . CESAREAN SECTION    . CHOLECYSTECTOMY    . ENDOMETRIAL ABLATION        Social History:     Social History   Tobacco Use  . Smoking status: Former Research scientist (life sciences)  . Smokeless tobacco: Never Used  Substance Use Topics  . Alcohol use: Not Currently    Comment: occasional     Lives - at home   Mobility - independent    Family History :   Family history significant for hypertension in her father   Home Medications:   Prior to Admission medications   Medication Sig Start Date End Date Taking? Authorizing Provider  naproxen (NAPROSYN) 500 MG tablet Take 1 tablet (500 mg total) by mouth 2 (two) times daily. 11/01/18   Horton, Barbette Hair, MD  nitrofurantoin, macrocrystal-monohydrate, (MACROBID) 100 MG capsule Take 1 capsule (100 mg total) by mouth 2 (two) times daily. 02/16/19   Molpus, Jenny Reichmann, MD     Allergies:     Allergies  Allergen Reactions  . Penicillins Anaphylaxis    Has patient had a PCN reaction causing immediate rash, facial/tongue/throat swelling, SOB or lightheadedness with hypotension: Yes Has patient had a PCN reaction causing severe rash involving mucus membranes or skin necrosis: Unknown Has patient had a PCN reaction that required hospitalization: Yes Has patient had a PCN reaction occurring within the last 10 years: Yes If all of the above answers are "NO", then may proceed with Cephalosporin use.     Physical Exam:   Vitals  Blood pressure 124/87, pulse 80, temperature 98.1 F (36.7 C), temperature source Oral, resp. rate 10, height 5\' 9"  (1.753 m), weight (!) 184.2 kg, SpO2 96 %.   1. General well-developed female, laying in bed, in no apparent distress  2. Normal  affect and insight, Not Suicidal or Homicidal, Awake Alert, Oriented X 3.  3. No F.N deficits, ALL C.Nerves Intact, Strength 5/5 all 4 extremities, Sensation intact all 4 extremities, Plantars down going.  4. Ears and Eyes appear Normal, Conjunctivae clear, PERRLA. Moist Oral Mucosa.  5. Supple Neck, No JVD, No cervical lymphadenopathy appriciated, No Carotid Bruits.  6. Symmetrical Chest wall movement, she has diminished air entry at the bases bilaterally , CTAB.  7. RRR, No Gallops, Rubs or Murmurs, No Parasternal Heave.  8. Positive Bowel Sounds, Abdomen Soft, No tenderness, No organomegaly appriciated,No rebound -guarding or rigidity.  9.  No Cyanosis, Normal Skin Turgor, No Skin Rash or Bruise.  10. Good muscle tone,  joints appear normal , no effusions, Normal ROM.  11. No Palpable Lymph Nodes in Neck or Axillae    Data Review:    CBC Recent Labs  Lab 05/25/19 2242  WBC 6.9  HGB 13.9  HCT 44.8  PLT 240  MCV 83.1  MCH 25.8*  MCHC 31.0  RDW 14.9  LYMPHSABS 2.1  MONOABS 0.4  EOSABS 0.0  BASOSABS 0.0   ------------------------------------------------------------------------------------------------------------------  Chemistries  Recent Labs  Lab 05/25/19 2242  NA 139  K 3.3*  CL 103  CO2 26  GLUCOSE 97  BUN 10  CREATININE 0.81  CALCIUM 8.2*  AST 34  ALT 27  ALKPHOS 57  BILITOT 0.7   ------------------------------------------------------------------------------------------------------------------ estimated creatinine clearance is 168.6 mL/min (by C-G formula based on SCr of 0.81 mg/dL). ------------------------------------------------------------------------------------------------------------------ No results for input(s): TSH, T4TOTAL, T3FREE, THYROIDAB in the last 72 hours.  Invalid input(s): FREET3  Coagulation profile No results for input(s): INR, PROTIME in the last 168 hours.  ------------------------------------------------------------------------------------------------------------------- Recent Labs    05/25/19 2242  DDIMER 0.69*   -------------------------------------------------------------------------------------------------------------------  Cardiac Enzymes No results for input(s): CKMB, TROPONINI, MYOGLOBIN in the last 168 hours.  Invalid input(s): CK ------------------------------------------------------------------------------------------------------------------ No results found for: BNP   ---------------------------------------------------------------------------------------------------------------  Urinalysis    Component Value Date/Time   COLORURINE YELLOW 02/16/2019 0031   APPEARANCEUR HAZY (A) 02/16/2019 0031   LABSPEC 1.025 02/16/2019 0031   PHURINE 6.0 02/16/2019 0031   GLUCOSEU NEGATIVE 02/16/2019 0031   HGBUR NEGATIVE 02/16/2019 0031   BILIRUBINUR NEGATIVE 02/16/2019 0031   KETONESUR NEGATIVE 02/16/2019 0031   PROTEINUR NEGATIVE 02/16/2019 0031   NITRITE POSITIVE (A) 02/16/2019 0031   LEUKOCYTESUR TRACE (  A) 02/16/2019 0031    ----------------------------------------------------------------------------------------------------------------   Imaging Results:    Dg Chest Port 1 View  Result Date: 05/25/2019 CLINICAL DATA:  Shortness of breath, cough and fever EXAM: PORTABLE CHEST 1 VIEW COMPARISON:  07/29/2015 FINDINGS: Mild cardiomegaly. Patchy bilateral interstitial and ground-glass opacity. No pneumothorax. IMPRESSION: 1. Patchy bilateral interstitial and ground-glass opacity, consider atypical or viral pneumonia 2. Mild cardiomegaly Electronically Signed   By: Donavan Foil M.D.   On: 05/25/2019 22:31    My personal review of EKG: Rhythm NSR, Rate  91 /min, QTc 456, no Acute ST changes   Assessment & Plan:    Active Problems:   Pneumonia due to COVID-19 virus   Acute respiratory failure (HCC)  [place] Hypoxic  respiratory failure due to COVID-19 pneumonia -Patient saturating 88% on room air, she is currently on 3 L nasal cannula, she is having bilateral opacities/infiltrate, he is positive for COVID-19, she will be started on IV Solu-Medrol, she meets criteria criteria for Remdesivir, so will  be started on Remdesivir as well, will start on vitamin C and zinc as well .for now I will hold on Actemra. -We will trend inflammatory markers daily. -On DVT prophylaxis per COVID-19 protocol -Proalcitonin within normal limit, no evidence of bacterial infection, no indication for antibiotics  Morbid obesity -Her BMI is 59   DVT Prophylaxis   Lovenox - SCDs   AM Labs Ordered, also please review Full Orders  Family Communication: Admission, patients condition and plan of care including tests being ordered have been discussed with the patient who indicate understanding and agree with the plan and Code Status.  Code Status Full  Likely DC to  Home  Condition GUARDED    Consults called: none  Admission status: inpatient  Time spent in minutes : 50 minutes   Phillips Climes M.D on 05/26/2019 at 3:27 PM  Between 7am to 7pm - Pager - [phone]. After 7pm go to [email or web address] - password [facility]  [facility] - Office  [phone]

## 2019-05-27 LAB — CBC WITH DIFFERENTIAL/PLATELET
Abs Immature Granulocytes: 0.03 10*3/uL (ref 0.00–0.07)
Basophils Absolute: 0 10*3/uL (ref 0.0–0.1)
Basophils Relative: 1 %
Eosinophils Absolute: 0 10*3/uL (ref 0.0–0.5)
Eosinophils Relative: 0 %
HCT: 47.5 % — ABNORMAL HIGH (ref 36.0–46.0)
Hemoglobin: 14.4 g/dL (ref 12.0–15.0)
Immature Granulocytes: 1 %
Lymphocytes Relative: 25 %
Lymphs Abs: 1 10*3/uL (ref 0.7–4.0)
MCH: 25.4 pg — ABNORMAL LOW (ref 26.0–34.0)
MCHC: 30.3 g/dL (ref 30.0–36.0)
MCV: 83.6 fL (ref 80.0–100.0)
Monocytes Absolute: 0.1 10*3/uL (ref 0.1–1.0)
Monocytes Relative: 1 %
Neutro Abs: 2.9 10*3/uL (ref 1.7–7.7)
Neutrophils Relative %: 72 %
Platelets: 266 10*3/uL (ref 150–400)
RBC: 5.68 MIL/uL — ABNORMAL HIGH (ref 3.87–5.11)
RDW: 14.8 % (ref 11.5–15.5)
WBC: 4.1 10*3/uL (ref 4.0–10.5)
nRBC: 0 % (ref 0.0–0.2)

## 2019-05-27 LAB — COMPREHENSIVE METABOLIC PANEL
ALT: 41 U/L (ref 0–44)
AST: 43 U/L — ABNORMAL HIGH (ref 15–41)
Albumin: 3.7 g/dL (ref 3.5–5.0)
Alkaline Phosphatase: 60 U/L (ref 38–126)
Anion gap: 11 (ref 5–15)
BUN: 11 mg/dL (ref 6–20)
CO2: 24 mmol/L (ref 22–32)
Calcium: 9 mg/dL (ref 8.9–10.3)
Chloride: 106 mmol/L (ref 98–111)
Creatinine, Ser: 0.68 mg/dL (ref 0.44–1.00)
GFR calc Af Amer: >60 mL/min (ref >60)
GFR calc non Af Amer: >60 mL/min (ref >60)
Glucose, Bld: 146 mg/dL — ABNORMAL HIGH (ref 70–99)
Potassium: 4.4 mmol/L (ref 3.5–5.1)
Sodium: 141 mmol/L (ref 135–145)
Total Bilirubin: 0.3 mg/dL (ref 0.3–1.2)
Total Protein: 8.3 g/dL — ABNORMAL HIGH (ref 6.5–8.1)

## 2019-05-27 LAB — C-REACTIVE PROTEIN: CRP: 5.2 mg/dL — ABNORMAL HIGH (ref <1.0)

## 2019-05-27 LAB — FERRITIN: Ferritin: 158 ng/mL (ref 11–307)

## 2019-05-27 LAB — D-DIMER, QUANTITATIVE: D-Dimer, Quant: 0.8 ug/mL-FEU — ABNORMAL HIGH (ref 0.00–0.50)

## 2019-05-27 MED ORDER — METHYLPREDNISOLONE SODIUM SUCC 40 MG IJ SOLR
40.0000 mg | Freq: Three times a day (TID) | INTRAMUSCULAR | Status: DC
  Administered 2019-05-27 – 2019-05-28 (×4): 40 mg via INTRAVENOUS
  Filled 2019-05-27 (×4): qty 1

## 2019-05-27 MED ORDER — SODIUM CHLORIDE 0.9% FLUSH: 10.0000 mL | INTRAVENOUS | Status: DC | PRN

## 2019-05-27 MED ORDER — SODIUM CHLORIDE 0.9% FLUSH
10.0000 mL | Freq: Two times a day (BID) | INTRAVENOUS | Status: DC
  Administered 2019-05-27 – 2019-05-30 (×4): 10 mL

## 2019-05-27 MED ORDER — SUMATRIPTAN SUCCINATE 25 MG PO TABS
25.0000 mg | ORAL_TABLET | ORAL | Status: DC | PRN
  Administered 2019-05-27 – 2019-05-28 (×3): 25 mg via ORAL
  Filled 2019-05-27 (×5): qty 1

## 2019-05-27 NOTE — Progress Notes (Signed)
Peripherally Inserted Central Catheter/Midline Placement  The IV Nurse has discussed with the patient and/or persons authorized to consent for the patient, the purpose of this procedure and the potential benefits and risks involved with this procedure.  The benefits include less needle sticks, lab draws from the catheter, and the patient may be discharged home with the catheter. Risks include, but not limited to, infection, bleeding, blood clot (thrombus formation), and puncture of an artery; nerve damage and irregular heartbeat and possibility to perform a PICC exchange if needed/ordered by physician.  Alternatives to this procedure were also discussed.  Bard Power PICC patient education guide, fact sheet on infection prevention and patient information card has been provided to patient /or left at bedside.    PICC/Midline Placement Documentation  PICC Single Lumen 05/27/19 PICC Right Cephalic 44 cm 2 cm (Active)  Indication for Insertion or Continuance of Line Poor Vasculature-patient has had multiple peripheral attempts or PIVs lasting less than 24 hours 05/27/19 1519  Exposed Catheter (cm) 2 cm 05/27/19 1519  Site Assessment Clean;Dry;Intact 05/27/19 1519  Line Status Flushed;Saline locked;Blood return noted 05/27/19 1519  Dressing Type Transparent;Securing device 05/27/19 1519  Dressing Status Clean;Dry;Intact;Antimicrobial disc in place 05/27/19 1519  Dressing Intervention New dressing 05/27/19 1519  Dressing Change Due 06/03/19 05/27/19 [place], Lorriann Hansmann 05/27/2019, 3:22 PM

## 2019-05-27 NOTE — Progress Notes (Signed)
Pt very independent. Is able to ambulate to restroom with no assist and with O2.

## 2019-05-27 NOTE — Progress Notes (Signed)
PROGRESS NOTE                                                                                                                                                                                                             Patient Demographics:    Kristin Boyd, is a 38 y.o. female, DOB - 26-Jan-1981, WEX:[id number]  Admit date - 05/25/2019   Admitting Physician Barb Merino, MD  Outpatient Primary MD for the patient is Patient, No Pcp Per  LOS - 1   Chief Complaint  Patient presents with  . Shortness of Breath    FEVER       Brief Narrative    37 y.o. female, without significant past medical history, presents with complaints of fever, shortness of breath, she was noted to be hypoxic requiring 2 L in NAD, her COVID-19 was positive, transferred to [facility] for further management.   Subjective:    Kristin Boyd today to report some dry cough, dyspnea has improved, but still present,.   Assessment  & Plan :    Active Problems:   Pneumonia due to COVID-19 virus   Acute respiratory failure (HCC)   Acutre Hypoxic respiratory failure due to COVID-19 pneumonia -In ED patient was on 4 L nasal cannula, this morning were able to wean back to 2 L nasal cannula - chest X-ray with bilateral opacities/infiltrate -Continue with IV Solu-Medrol, will decrease dose today. -Continue with IV Remdesivir, started on admission 05/26/2019 -To be improving, less oxygen requirement today, written within normal limit, CRP trending down, no indication for Actemra -Proalcitonin within normal limit, no evidence of bacterial infection, no indication for antibiotics -She was encouraged to use incentive spirometry today  Morbid obesity -Her BMI is 59    COVID-19 Labs  Recent Labs    05/25/19 2242 05/25/19 2243 05/26/19 1540 05/27/19 0300  DDIMER 0.69*  --  0.82* 0.80*  FERRITIN  --  166 178 158  LDH 330*  --  313*  --   CRP  --  6.8* 5.6* 5.2*    Lab  Results  Component Value Date   SARSCOV2NAA POSITIVE (A) 05/25/2019     Code Status : Full  Family Communication  : D/W patient  Disposition Plan  : Home when stable  Barriers For Discharge : on IV and severe, and oxygen  Consults  :  None  Procedures  : None  DVT Prophylaxis  : Lovenox  Lab Results  Component Value Date   PLT 266 05/27/2019    Antibiotics  :    Anti-infectives (From admission, onward)   Start     Dose/Rate Route Frequency Ordered Stop   05/27/19 1800  remdesivir 100 mg in sodium chloride 0.9 % 250 mL IVPB     100 mg 500 mL/hr over 30 Minutes Intravenous Every 24 hours 05/26/19 1703 05/31/19 1759   05/26/19 1800  remdesivir 200 mg in sodium chloride 0.9 % 250 mL IVPB     200 mg 500 mL/hr over 30 Minutes Intravenous Once 05/26/19 1703 05/26/19 1834        Objective:   Vitals:   05/27/19 0300 05/27/19 0400 05/27/19 0445 05/27/19 0700  BP:   (!) 145/90 (!) 146/71  Pulse: 78 81 84 81  Resp: 20 (!) 27 19 (!) 31  Temp:   98.6 F (37 C) 98.5 F (36.9 C)  TempSrc:   Oral Oral  SpO2: 91% (!) 88% 90% 91%  Weight:      Height:        Wt Readings from Last 3 Encounters:  05/25/19 (!) 184.2 kg  02/15/19 (!) 181.3 kg  07/05/18 (!) 171.5 kg     Intake/Output Summary (Last 24 hours) at 05/27/2019 1056 Last data filed at 05/27/2019 0607 Gross per 24 hour  Intake 1144.17 ml  Output 1000 ml  Net 144.17 ml     Physical Exam  Awake Alert, Oriented X 3, No new F.N deficits, Normal affect Symmetrical Chest wall movement, diminished air entry at the bases with some rails, no wheezing RRR,No Gallops,Rubs or new Murmurs, No Parasternal Heave +ve B.Sounds, Abd Soft, No tenderness,  No rebound - guarding or rigidity. No Cyanosis, Clubbing or edema, No new Rash or bruise      Data Review:    CBC Recent Labs  Lab 05/25/19 2242 05/27/19 0300  WBC 6.9 4.1  HGB 13.9 14.4  HCT 44.8 47.5*  PLT 240 266  MCV 83.1 83.6  MCH 25.8* 25.4*  MCHC 31.0  30.3  RDW 14.9 14.8  LYMPHSABS 2.1 1.0  MONOABS 0.4 0.1  EOSABS 0.0 0.0  BASOSABS 0.0 0.0    Chemistries  Recent Labs  Lab 05/25/19 2242 05/27/19 0300  NA 139 141  K 3.3* 4.4  CL 103 106  CO2 26 24  GLUCOSE 97 146*  BUN 10 11  CREATININE 0.81 0.68  CALCIUM 8.2* 9.0  AST 34 43*  ALT 27 41  ALKPHOS 57 60  BILITOT 0.7 0.3   ------------------------------------------------------------------------------------------------------------------ Recent Labs    05/25/19 2242  TRIG 62    No results found for: HGBA1C ------------------------------------------------------------------------------------------------------------------ No results for input(s): TSH, T4TOTAL, T3FREE, THYROIDAB in the last 72 hours.  Invalid input(s): FREET3 ------------------------------------------------------------------------------------------------------------------ Recent Labs    05/26/19 1540 05/27/19 0300  FERRITIN 178 158    Coagulation profile No results for input(s): INR, PROTIME in the last 168 hours.  Recent Labs    05/26/19 1540 05/27/19 0300  DDIMER 0.82* 0.80*    Cardiac Enzymes No results for input(s): CKMB, TROPONINI, MYOGLOBIN in the last 168 hours.  Invalid input(s): CK ------------------------------------------------------------------------------------------------------------------ No results found for: BNP  Inpatient Medications  Scheduled Meds: . docusate sodium  100 mg Oral BID  . enoxaparin (LOVENOX) injection  90 mg Subcutaneous Q24H  . famotidine  20 mg Oral BID  . ipratropium  2 puff Inhalation Q6H  .  methylPREDNISolone (SOLU-MEDROL) injection  80 mg Intravenous Q8H  . vitamin C  500 mg Oral Daily  . zinc sulfate  220 mg Oral Daily   Continuous Infusions: . sodium chloride Stopped (05/26/19 1935)  . remdesivir 100 mg in NS 250 mL     PRN Meds:.sodium chloride, acetaminophen, guaiFENesin-dextromethorphan  Micro Results Recent Results (from the past  240 hour(s))  Blood Culture (routine x 2)     Status: None (Preliminary result)   Collection Time: 05/25/19 10:40 PM   Specimen: BLOOD  Result Value Ref Range Status   Specimen Description   Final    BLOOD BLOOD RIGHT ARM Performed at [facility], [address] [place], [place] [address]    Special Requests   Final    BOTTLES DRAWN AEROBIC AND ANAEROBIC Blood Culture adequate volume Performed at [facility], [address] [place], [place] [address]    Culture   Final    NO GROWTH 1 DAY Performed at [facility], [place] [address], [place], [place] [address]    Report Status PENDING  Incomplete  SARS Coronavirus 2 (Hosp order,Performed in [facility] lab via Abbott ID)     Status: Abnormal   Collection Time: 05/25/19 10:42 PM   Specimen: Dry Nasal Swab (Abbott ID Now)  Result Value Ref Range Status   SARS Coronavirus 2 (Abbott ID Now) POSITIVE (A) NEGATIVE Final    Comment: RESULT CALLED TO, READ BACK BY AND VERIFIED WITH: ADKINS LISA RN AT 2339 ON 05/25/19 BY [email or web address] (NOTE) Interpretive Result Comment(s): COVID 19 Positive SARS CoV 2 target nucleic acids are DETECTED. The SARS CoV 2 RNA is generally detectable in upper and lower respiratory specimens during the acute phase of infection.  Positive results are indicative of active infection with SARS CoV 2.  Clinical correlation with patient history and other diagnostic information is necessary to determine patient infection status.  Positive results do not rule out bacterial infection or coinfection with other viruses. The expected result is Negative. COVID 19 Negative SARS CoV 2 target nucleic acids are NOT DETECTED. The SARS CoV 2 RNA is generally detectable in upper and lower respiratory specimens during the acute phase of infection.  Negative results do not preclude SARS CoV 2 infection, do not rule out coinfections with other pathogens, and should not be used as the sole basis for t  reatment or other patient management decisions.  Negative results must be combined with clinical observations, patient history, and epidemiological information. The expected result is Negative. Invalid Presence or absence of SARS CoV 2 nucleic acids cannot be determined. Repeat testing was performed on the submitted specimen and repeated Invalid results were obtained.  If clinically indicated, additional testing on a new specimen with an alternate test methodology [phone]) is advised.  The SARS CoV 2 RNA is generally detectable in upper and lower respiratory specimens during the acute phase of infection. The expected result is Negative. Fact Sheet for Patients:  GolfingFamily.no Fact Sheet for Healthcare Providers: https://www.hernandez-brewer.com/ This test is not yet approved or cleared by the [place] FDA and has been authorized for detection and/or diagnosis of SARS CoV 2 by FDA under an Emergency Use Authorization (EUA).  T his EUA will remain in effect (meaning this test can be used) for the duration of the COVID19 declaration under Section 564(b)(1) of the Act, 21 U.S.C. section [phone] 3(b)(1), unless the authorization is terminated or revoked sooner. Performed at [facility],  [place], [place] [address]   Blood Culture (routine x 2)     Status: None (Preliminary result)   Collection Time: 05/25/19 10:55 PM   Specimen: BLOOD  Result Value Ref Range Status   Specimen Description   Final    BLOOD BLOOD LEFT ARM Performed at [facility], [address] [place], [place] [address]    Special Requests   Final    BOTTLES DRAWN AEROBIC AND ANAEROBIC Blood Culture adequate volume Performed at [facility], [address], [place], [place] [address]    Culture   Final    NO GROWTH 1 DAY Performed at [facility], [place] [address], [place], [place] [address]    Report Status PENDING   Incomplete    Radiology Reports Dg Chest Port 1 View  Result Date: 05/25/2019 CLINICAL DATA:  Shortness of breath, cough and fever EXAM: PORTABLE CHEST 1 VIEW COMPARISON:  07/29/2015 FINDINGS: Mild cardiomegaly. Patchy bilateral interstitial and ground-glass opacity. No pneumothorax. IMPRESSION: 1. Patchy bilateral interstitial and ground-glass opacity, consider atypical or viral pneumonia 2. Mild cardiomegaly Electronically Signed   By: Donavan Foil M.D.   On: 05/25/2019 22:31   [place] Ekg Site Rite  Result Date: 05/26/2019 If Site Rite image not attached, placement could not be confirmed due to current cardiac rhythm.   Time Spent in minutes  25 minutes   Phillips Climes M.D on 05/27/2019 at 10:56 AM  Between 7am to 7pm - Pager - [phone]  After 7pm go to [email or web address] - password [facility]  [facility] -  Office  [phone]

## 2019-05-28 ENCOUNTER — Inpatient Hospital Stay (HOSPITAL_COMMUNITY): Payer: HRSA Program

## 2019-05-28 LAB — CBC WITH DIFFERENTIAL/PLATELET
Abs Immature Granulocytes: 0.12 10*3/uL — ABNORMAL HIGH (ref 0.00–0.07)
Basophils Absolute: 0.1 10*3/uL (ref 0.0–0.1)
Basophils Relative: 0 %
Eosinophils Absolute: 0 10*3/uL (ref 0.0–0.5)
Eosinophils Relative: 0 %
HCT: 44.6 % (ref 36.0–46.0)
Hemoglobin: 13.7 g/dL (ref 12.0–15.0)
Immature Granulocytes: 1 %
Lymphocytes Relative: 13 %
Lymphs Abs: 1.8 10*3/uL (ref 0.7–4.0)
MCH: 25.4 pg — ABNORMAL LOW (ref 26.0–34.0)
MCHC: 30.7 g/dL (ref 30.0–36.0)
MCV: 82.6 fL (ref 80.0–100.0)
Monocytes Absolute: 0.7 10*3/uL (ref 0.1–1.0)
Monocytes Relative: 5 %
Neutro Abs: 11.8 10*3/uL — ABNORMAL HIGH (ref 1.7–7.7)
Neutrophils Relative %: 81 %
Platelets: 325 10*3/uL (ref 150–400)
RBC: 5.4 MIL/uL — ABNORMAL HIGH (ref 3.87–5.11)
RDW: 14.9 % (ref 11.5–15.5)
WBC: 14.5 10*3/uL — ABNORMAL HIGH (ref 4.0–10.5)
nRBC: 0 % (ref 0.0–0.2)

## 2019-05-28 LAB — FERRITIN: Ferritin: 161 ng/mL (ref 11–307)

## 2019-05-28 LAB — COMPREHENSIVE METABOLIC PANEL
ALT: 30 U/L (ref 0–44)
AST: 21 U/L (ref 15–41)
Albumin: 3.5 g/dL (ref 3.5–5.0)
Alkaline Phosphatase: 60 U/L (ref 38–126)
Anion gap: 12 (ref 5–15)
BUN: 13 mg/dL (ref 6–20)
CO2: 24 mmol/L (ref 22–32)
Calcium: 8.8 mg/dL — ABNORMAL LOW (ref 8.9–10.3)
Chloride: 108 mmol/L (ref 98–111)
Creatinine, Ser: 0.64 mg/dL (ref 0.44–1.00)
GFR calc Af Amer: >60 mL/min (ref >60)
GFR calc non Af Amer: >60 mL/min (ref >60)
Glucose, Bld: 141 mg/dL — ABNORMAL HIGH (ref 70–99)
Potassium: 4.3 mmol/L (ref 3.5–5.1)
Sodium: 144 mmol/L (ref 135–145)
Total Bilirubin: 0.1 mg/dL — ABNORMAL LOW (ref 0.3–1.2)
Total Protein: 7.6 g/dL (ref 6.5–8.1)

## 2019-05-28 LAB — D-DIMER, QUANTITATIVE: D-Dimer, Quant: 0.55 ug/mL-FEU — ABNORMAL HIGH (ref 0.00–0.50)

## 2019-05-28 LAB — LIPASE, BLOOD: Lipase: 21 U/L (ref 11–51)

## 2019-05-28 LAB — C-REACTIVE PROTEIN: CRP: 1.8 mg/dL — ABNORMAL HIGH (ref <1.0)

## 2019-05-28 MED ORDER — METHYLPREDNISOLONE SODIUM SUCC 40 MG IJ SOLR
40.0000 mg | Freq: Every day | INTRAMUSCULAR | Status: DC
  Administered 2019-05-29: 40 mg via INTRAVENOUS
  Filled 2019-05-28: qty 1

## 2019-05-28 MED ORDER — IPRATROPIUM BROMIDE HFA 17 MCG/ACT IN AERS
2.0000 | INHALATION_SPRAY | Freq: Four times a day (QID) | RESPIRATORY_TRACT | Status: DC | PRN
  Administered 2019-05-28: 2 via RESPIRATORY_TRACT

## 2019-05-28 MED ORDER — MORPHINE SULFATE (PF) 2 MG/ML IV SOLN
4.0000 mg | Freq: Once | INTRAVENOUS | Status: AC
  Administered 2019-05-28: 06:00:00 4 mg via INTRAVENOUS
  Filled 2019-05-28: qty 2

## 2019-05-28 NOTE — Progress Notes (Signed)
Called to update pt's sister.Questions asked and answered.

## 2019-05-28 NOTE — Progress Notes (Signed)
PROGRESS NOTE                                                                                                                                                                                                             Patient Demographics:    Kristin Boyd, is a 38 y.o. female, DOB - 1981-02-26, MAY:[id number]  Admit date - 05/25/2019   Admitting Physician Barb Merino, MD  Outpatient Primary MD for the patient is Patient, No Pcp Per  LOS - 2   Chief Complaint  Patient presents with  . Shortness of Breath    FEVER       Brief Narrative    38 y.o. female, without significant past medical history, presents with complaints of fever, shortness of breath, she was noted to be hypoxic requiring 2 L in NAD, her COVID-19 was positive, transferred to [facility] for further management.   Subjective:    Kristin Boyd today to report some dry cough, dyspnea has improved, but still present,.   Assessment  & Plan :    Active Problems:   Pneumonia due to COVID-19 virus   Acute respiratory failure (HCC)   Acutre Hypoxic respiratory failure due to COVID-19 pneumonia -In ED patient was on 4 L nasal cannula, she is weaned to room air currently . - chest X-ray with bilateral opacities/infiltrate -On IV Solu-Medrol, tapering -Continue with IV Remdesivir, started on admission 05/26/2019 -Telemetry markers trending down, which is reassuring -Proalcitonin within normal limit, no evidence of bacterial infection, no indication for antibiotics -She was encouraged to use incentive spirometry today -[place] ptosis trending up, this is most likely due to steroids,  nontoxic-appearing.  abd Pain -Patient had an episode of abdominal pain earlier today, currently much improved, reports this is chronic recurrent issue in the past secondary to" chronic uterus scarring" -Lipase within normal limit.  Abdominal x-ray is pending  Morbid obesity -Her BMI is 59     COVID-19 Labs  Recent Labs    05/25/19 2242  05/26/19 1540 05/27/19 0300 05/28/19 0310  DDIMER 0.69*  --  0.82* 0.80* 0.55*  FERRITIN  --    < > 178 158 161  LDH 330*  --  313*  --   --   CRP  --    < > 5.6* 5.2* 1.8*   < > = values in  this interval not displayed.    Lab Results  Component Value Date   SARSCOV2NAA POSITIVE (A) 05/25/2019     Code Status : Full  Family Communication  : D/W patient  Disposition Plan  : Home when stable  Barriers For Discharge : on IV and severe, and oxygen  Consults  : None  Procedures  : None  DVT Prophylaxis  : Lovenox  Lab Results  Component Value Date   PLT 325 05/28/2019    Antibiotics  :    Anti-infectives (From admission, onward)   Start     Dose/Rate Route Frequency Ordered Stop   05/27/19 1800  remdesivir 100 mg in sodium chloride 0.9 % 250 mL IVPB     100 mg 500 mL/hr over 30 Minutes Intravenous Every 24 hours 05/26/19 1703 05/31/19 1759   05/26/19 1800  remdesivir 200 mg in sodium chloride 0.9 % 250 mL IVPB     200 mg 500 mL/hr over 30 Minutes Intravenous Once 05/26/19 1703 05/26/19 1834        Objective:   Vitals:   05/27/19 2300 05/28/19 0510 05/28/19 0735 05/28/19 0900  BP:  121/71 140/69   Pulse: 87 89 89 84  Resp: (!) 28 (!) 22 (!) 22 17  Temp:  98.8 F (37.1 C) 98.4 F (36.9 C)   TempSrc:  Oral Oral   SpO2: 92% 97% 91% 97%  Weight:      Height:        Wt Readings from Last 3 Encounters:  05/25/19 (!) 184.2 kg  02/15/19 (!) 181.3 kg  07/05/18 (!) 171.5 kg     Intake/Output Summary (Last 24 hours) at 05/28/2019 1350 Last data filed at 05/28/2019 0830 Gross per 24 hour  Intake 1579.55 ml  Output -  Net 1579.55 ml     Physical Exam  Awake Alert, Oriented X 3, No new F.N deficits, Normal affect Symmetrical Chest wall movement, Good air movement bilaterally, CTAB RRR,No Gallops,Rubs or new Murmurs, No Parasternal Heave +ve B.Sounds, Abd Soft, No tenderness, No rebound - guarding or  rigidity. No Cyanosis, Clubbing or edema, No new Rash or bruise       Data Review:    CBC Recent Labs  Lab 05/25/19 2242 05/27/19 0300 05/28/19 0310  WBC 6.9 4.1 14.5*  HGB 13.9 14.4 13.7  HCT 44.8 47.5* 44.6  PLT 240 266 325  MCV 83.1 83.6 82.6  MCH 25.8* 25.4* 25.4*  MCHC 31.0 30.3 30.7  RDW 14.9 14.8 14.9  LYMPHSABS 2.1 1.0 1.8  MONOABS 0.4 0.1 0.7  EOSABS 0.0 0.0 0.0  BASOSABS 0.0 0.0 0.1    Chemistries  Recent Labs  Lab 05/25/19 2242 05/27/19 0300 05/28/19 0310  NA 139 141 144  K 3.3* 4.4 4.3  CL 103 106 108  CO2 26 24 24   GLUCOSE 97 146* 141*  BUN 10 11 13   CREATININE 0.81 0.68 0.64  CALCIUM 8.2* 9.0 8.8*  AST 34 43* 21  ALT 27 41 30  ALKPHOS 57 60 60  BILITOT 0.7 0.3 0.1*   ------------------------------------------------------------------------------------------------------------------ Recent Labs    05/25/19 2242  TRIG 62    No results found for: HGBA1C ------------------------------------------------------------------------------------------------------------------ No results for input(s): TSH, T4TOTAL, T3FREE, THYROIDAB in the last 72 hours.  Invalid input(s): FREET3 ------------------------------------------------------------------------------------------------------------------ Recent Labs    05/27/19 0300 05/28/19 0310  FERRITIN 158 161    Coagulation profile No results for input(s): INR, PROTIME in the last 168 hours.  Recent Labs    05/27/19  0300 05/28/19 0310  DDIMER 0.80* 0.55*    Cardiac Enzymes No results for input(s): CKMB, TROPONINI, MYOGLOBIN in the last 168 hours.  Invalid input(s): CK ------------------------------------------------------------------------------------------------------------------ No results found for: BNP  Inpatient Medications  Scheduled Meds: . docusate sodium  100 mg Oral BID  . enoxaparin (LOVENOX) injection  90 mg Subcutaneous Q24H  . famotidine  20 mg Oral BID  .  methylPREDNISolone (SOLU-MEDROL) injection  40 mg Intravenous Q8H  . sodium chloride flush  10-40 mL Intracatheter Q12H  . vitamin C  500 mg Oral Daily  . zinc sulfate  220 mg Oral Daily   Continuous Infusions: . sodium chloride 250 mL (05/27/19 1805)  . remdesivir 100 mg in NS 250 mL 100 mg (05/27/19 1806)   PRN Meds:.sodium chloride, acetaminophen, guaiFENesin-dextromethorphan, ipratropium, sodium chloride flush, SUMAtriptan  Micro Results Recent Results (from the past 240 hour(s))  Blood Culture (routine x 2)     Status: None (Preliminary result)   Collection Time: 05/25/19 10:40 PM   Specimen: BLOOD  Result Value Ref Range Status   Specimen Description   Final    BLOOD BLOOD RIGHT ARM Performed at [facility], [address] [place], [place] [address]    Special Requests   Final    BOTTLES DRAWN AEROBIC AND ANAEROBIC Blood Culture adequate volume Performed at [facility], [address] [place], [place] [address]    Culture   Final    NO GROWTH 1 DAY Performed at [facility], [place] [address], [place], [place] [address]    Report Status PENDING  Incomplete  SARS Coronavirus 2 (Hosp order,Performed in [facility] lab via Abbott ID)     Status: Abnormal   Collection Time: 05/25/19 10:42 PM   Specimen: Dry Nasal Swab (Abbott ID Now)  Result Value Ref Range Status   SARS Coronavirus 2 (Abbott ID Now) POSITIVE (A) NEGATIVE Final    Comment: RESULT CALLED TO, READ BACK BY AND VERIFIED WITH: ADKINS LISA RN AT 2339 ON 05/25/19 BY [email or web address] (NOTE) Interpretive Result Comment(s): COVID 19 Positive SARS CoV 2 target nucleic acids are DETECTED. The SARS CoV 2 RNA is generally detectable in upper and lower respiratory specimens during the acute phase of infection.  Positive results are indicative of active infection with SARS CoV 2.  Clinical correlation with patient history and other diagnostic information is necessary to determine patient infection  status.  Positive results do not rule out bacterial infection or coinfection with other viruses. The expected result is Negative. COVID 19 Negative SARS CoV 2 target nucleic acids are NOT DETECTED. The SARS CoV 2 RNA is generally detectable in upper and lower respiratory specimens during the acute phase of infection.  Negative results do not preclude SARS CoV 2 infection, do not rule out coinfections with other pathogens, and should not be used as the sole basis for t reatment or other patient management decisions.  Negative results must be combined with clinical observations, patient history, and epidemiological information. The expected result is Negative. Invalid Presence or absence of SARS CoV 2 nucleic acids cannot be determined. Repeat testing was performed on the submitted specimen and repeated Invalid results were obtained.  If clinically indicated, additional testing on a new specimen with an alternate test methodology [phone]) is advised.  The SARS CoV 2 RNA is generally detectable in upper and lower respiratory specimens during the acute phase of infection. The expected result is Negative. Fact Sheet for Patients:  GolfingFamily.no Fact Sheet for Healthcare Providers: https://www.hernandez-brewer.com/ This test is not yet approved or cleared by the [place] FDA and has been authorized for detection and/or diagnosis of SARS CoV 2 by FDA under an Emergency Use Authorization (EUA).  T his EUA will remain in effect (meaning this test can be used) for the duration of the COVID19 declaration under Section 564(b)(1) of the Act, 21 U.S.C. section [phone] 3(b)(1), unless the authorization is terminated or revoked sooner. Performed at [facility], [address] [place], [place] [address]   Blood Culture (routine x 2)     Status: None (Preliminary result)   Collection Time: 05/25/19 10:55 PM   Specimen: BLOOD  Result Value  Ref Range Status   Specimen Description   Final    BLOOD BLOOD LEFT ARM Performed at [facility], [address] [place], [place] [address]    Special Requests   Final    BOTTLES DRAWN AEROBIC AND ANAEROBIC Blood Culture adequate volume Performed at [facility], [address], [place], [place] [address]    Culture   Final    NO GROWTH 1 DAY Performed at [facility], [place] [address] [place], [place] [address]    Report Status PENDING  Incomplete    Radiology Reports Dg Chest Port 1 View  Result Date: 05/25/2019 CLINICAL DATA:  Shortness of breath, cough and fever EXAM: PORTABLE CHEST 1 VIEW COMPARISON:  07/29/2015 FINDINGS: Mild cardiomegaly. Patchy bilateral interstitial and ground-glass opacity. No pneumothorax. IMPRESSION: 1. Patchy bilateral interstitial and ground-glass opacity, consider atypical or viral pneumonia 2. Mild cardiomegaly Electronically Signed   By: Donavan Foil M.D.   On: 05/25/2019 22:31   [place] Ekg Site Rite  Result Date: 05/26/2019 If Site Rite image not attached, placement could not be confirmed due to current cardiac rhythm.   Time Spent in minutes  25 minutes   Phillips Climes M.D on 05/28/2019 at 1:50 PM  Between 7am to 7pm - Pager - [phone]  After 7pm go to [email or web address] - password [facility]  [facility] -  Office  [phone]

## 2019-05-28 NOTE — Progress Notes (Signed)
Pt ambulated in hallway making 3 full laps after dinner on RA

## 2019-05-28 NOTE — Progress Notes (Signed)
Patient awaken from sleep d/t severe pain on the lower abdomen; pt stated as 9 out of 10, pt is crying, guarding on lower abdomen noted, pt stated that she had this pain on and off; MD notified, with order carried out; will continue to monitor patient.

## 2019-05-29 DIAGNOSIS — R109 Unspecified abdominal pain: Secondary | ICD-10-CM

## 2019-05-29 LAB — COMPREHENSIVE METABOLIC PANEL
ALT: 28 U/L (ref 0–44)
AST: 17 U/L (ref 15–41)
Albumin: 3.6 g/dL (ref 3.5–5.0)
Alkaline Phosphatase: 55 U/L (ref 38–126)
Anion gap: 10 (ref 5–15)
BUN: 15 mg/dL (ref 6–20)
CO2: 28 mmol/L (ref 22–32)
Calcium: 8.6 mg/dL — ABNORMAL LOW (ref 8.9–10.3)
Chloride: 106 mmol/L (ref 98–111)
Creatinine, Ser: 0.58 mg/dL (ref 0.44–1.00)
GFR calc Af Amer: >60 mL/min (ref >60)
GFR calc non Af Amer: >60 mL/min (ref >60)
Glucose, Bld: 113 mg/dL — ABNORMAL HIGH (ref 70–99)
Potassium: 3.6 mmol/L (ref 3.5–5.1)
Sodium: 144 mmol/L (ref 135–145)
Total Bilirubin: 0.4 mg/dL (ref 0.3–1.2)
Total Protein: 7.6 g/dL (ref 6.5–8.1)

## 2019-05-29 LAB — CBC WITH DIFFERENTIAL/PLATELET
Abs Immature Granulocytes: 0.14 10*3/uL — ABNORMAL HIGH (ref 0.00–0.07)
Basophils Absolute: 0.1 10*3/uL (ref 0.0–0.1)
Basophils Relative: 0 %
Eosinophils Absolute: 0 10*3/uL (ref 0.0–0.5)
Eosinophils Relative: 0 %
HCT: 43.4 % (ref 36.0–46.0)
Hemoglobin: 13.7 g/dL (ref 12.0–15.0)
Immature Granulocytes: 1 %
Lymphocytes Relative: 23 %
Lymphs Abs: 3.6 10*3/uL (ref 0.7–4.0)
MCH: 25.9 pg — ABNORMAL LOW (ref 26.0–34.0)
MCHC: 31.6 g/dL (ref 30.0–36.0)
MCV: 82.2 fL (ref 80.0–100.0)
Monocytes Absolute: 0.9 10*3/uL (ref 0.1–1.0)
Monocytes Relative: 6 %
Neutro Abs: 10.9 10*3/uL — ABNORMAL HIGH (ref 1.7–7.7)
Neutrophils Relative %: 70 %
Platelets: 339 10*3/uL (ref 150–400)
RBC: 5.28 MIL/uL — ABNORMAL HIGH (ref 3.87–5.11)
RDW: 14.9 % (ref 11.5–15.5)
WBC: 15.6 10*3/uL — ABNORMAL HIGH (ref 4.0–10.5)
nRBC: 0 % (ref 0.0–0.2)

## 2019-05-29 LAB — FERRITIN: Ferritin: 171 ng/mL (ref 11–307)

## 2019-05-29 LAB — C-REACTIVE PROTEIN: CRP: 1 mg/dL — ABNORMAL HIGH (ref <1.0)

## 2019-05-29 LAB — D-DIMER, QUANTITATIVE: D-Dimer, Quant: 0.55 ug/mL-FEU — ABNORMAL HIGH (ref 0.00–0.50)

## 2019-05-29 MED ORDER — HYDROCODONE-ACETAMINOPHEN 5-325 MG PO TABS: 1.0000 | ORAL_TABLET | Freq: Four times a day (QID) | ORAL | Status: DC | PRN

## 2019-05-29 MED ORDER — PREDNISONE 20 MG PO TABS
20.0000 mg | ORAL_TABLET | Freq: Every day | ORAL | Status: DC
  Administered 2019-05-30: 20 mg via ORAL
  Filled 2019-05-29: qty 1

## 2019-05-29 NOTE — Progress Notes (Signed)
PROGRESS NOTE                                                                                                                                                                                                             Patient Demographics:    Kristin Boyd, is a 38 y.o. female, DOB - November 29, 1981, [id number]  Admit date - 05/25/2019   Admitting Physician Barb Merino, MD  Outpatient Primary MD for the patient is Patient, No Pcp Per  LOS - 3   Chief Complaint  Patient presents with  . Shortness of Breath    FEVER       Brief Narrative    38 y.o. female, without significant past medical history, presents with complaints of fever, shortness of breath, she was noted to be hypoxic requiring 2 L in NAD, her COVID-19 was positive, transferred to [facility] for further management.   Subjective:    Kristin Boyd today denies any SOB, reports mild cough, no fever, no abdominal pain.   Assessment  & Plan :    Active Problems:   Pneumonia due to COVID-19 virus   Acute respiratory failure (HCC)   Acutre Hypoxic respiratory failure due to COVID-19 pneumonia -In ED patient was on 4 L nasal cannula, she is weaned to room air currently . - chest X-ray with bilateral opacities/infiltrate -On IV Solu-Medrol, tapering, transition to p.o. prednisone -Continue with IV Remdesivir, started on admission 05/26/2019, tomorrow will be the last dose -Inflammatory markers trending down -Proalcitonin within normal limit, no evidence of bacterial infection, no indication for antibiotics -She was encouraged to use incentive spirometry today -Leukocytosis related to steroids, she is afebrile, nontoxic-appearing  abd Pain -Patient had an episode of abdominal pain earlier today, currently much improved, reports this is chronic recurrent issue in the past secondary to" chronic uterus scarring" -Lipase within normal limit.   x-ray with no acute findings  Morbid  obesity -Her BMI is 59    COVID-19 Labs  Recent Labs    05/27/19 0300 05/28/19 0310 05/29/19 0340  DDIMER 0.80* 0.55* 0.55*  FERRITIN 158 161 171  CRP 5.2* 1.8* 1.0*    Lab Results  Component Value Date   SARSCOV2NAA POSITIVE (A) 05/25/2019     Code Status : Full  Family Communication  : D/W patient  Disposition Plan  : Home when  stable  Consults  : None  Procedures  : None  DVT Prophylaxis  : Lovenox  Lab Results  Component Value Date   PLT 339 05/29/2019    Antibiotics  :    Anti-infectives (From admission, onward)   Start     Dose/Rate Route Frequency Ordered Stop   05/27/19 1800  remdesivir 100 mg in sodium chloride 0.9 % 250 mL IVPB     100 mg 500 mL/hr over 30 Minutes Intravenous Every 24 hours 05/26/19 1703 05/31/19 1759   05/26/19 1800  remdesivir 200 mg in sodium chloride 0.9 % 250 mL IVPB     200 mg 500 mL/hr over 30 Minutes Intravenous Once 05/26/19 1703 05/26/19 1834        Objective:   Vitals:   05/28/19 1614 05/28/19 2008 05/29/19 0005 05/29/19 0345  BP: (!) 143/88 (!) 156/97  (!) 147/91  Pulse: 89 89 84 86  Resp: (!) 21 18 19  (!) 22  Temp: 98.8 F (37.1 C) 97.9 F (36.6 C)  98.6 F (37 C)  TempSrc: Oral Oral  Oral  SpO2: 94% 97% 97% 94%  Weight:      Height:        Wt Readings from Last 3 Encounters:  05/25/19 (!) 184.2 kg  02/15/19 (!) 181.3 kg  07/05/18 (!) 171.5 kg     Intake/Output Summary (Last 24 hours) at 05/29/2019 1547 Last data filed at 05/28/2019 1835 Gross per 24 hour  Intake 360 ml  Output -  Net 360 ml     Physical Exam  Awake Alert, Oriented X 3, No new F.N deficits, Normal affect Symmetrical Chest wall movement, Good air movement bilaterally, CTAB RRR,No Gallops,Rubs or new Murmurs, No Parasternal Heave +ve B.Sounds, Abd Soft, No tenderness, No rebound - guarding or rigidity. No Cyanosis, Clubbing or edema, No new Rash or bruise        Data Review:    CBC Recent Labs  Lab 05/25/19 2242  05/27/19 0300 05/28/19 0310 05/29/19 0340  WBC 6.9 4.1 14.5* 15.6*  HGB 13.9 14.4 13.7 13.7  HCT 44.8 47.5* 44.6 43.4  PLT 240 266 325 339  MCV 83.1 83.6 82.6 82.2  MCH 25.8* 25.4* 25.4* 25.9*  MCHC 31.0 30.3 30.7 31.6  RDW 14.9 14.8 14.9 14.9  LYMPHSABS 2.1 1.0 1.8 3.6  MONOABS 0.4 0.1 0.7 0.9  EOSABS 0.0 0.0 0.0 0.0  BASOSABS 0.0 0.0 0.1 0.1    Chemistries  Recent Labs  Lab 05/25/19 2242 05/27/19 0300 05/28/19 0310 05/29/19 0340  NA 139 141 144 144  K 3.3* 4.4 4.3 3.6  CL 103 106 108 106  CO2 26 24 24 28   GLUCOSE 97 146* 141* 113*  BUN 10 11 13 15   CREATININE 0.81 0.68 0.64 0.58  CALCIUM 8.2* 9.0 8.8* 8.6*  AST 34 43* 21 17  ALT 27 41 30 28  ALKPHOS 57 60 60 55  BILITOT 0.7 0.3 0.1* 0.4   ------------------------------------------------------------------------------------------------------------------ No results for input(s): CHOL, HDL, LDLCALC, TRIG, CHOLHDL, LDLDIRECT in the last 72 hours.  No results found for: HGBA1C ------------------------------------------------------------------------------------------------------------------ No results for input(s): TSH, T4TOTAL, T3FREE, THYROIDAB in the last 72 hours.  Invalid input(s): FREET3 ------------------------------------------------------------------------------------------------------------------ Recent Labs    05/28/19 0310 05/29/19 0340  FERRITIN 161 171    Coagulation profile No results for input(s): INR, PROTIME in the last 168 hours.  Recent Labs    05/28/19 0310 05/29/19 0340  DDIMER 0.55* 0.55*    Cardiac Enzymes No results for  input(s): CKMB, TROPONINI, MYOGLOBIN in the last 168 hours.  Invalid input(s): CK ------------------------------------------------------------------------------------------------------------------ No results found for: BNP  Inpatient Medications  Scheduled Meds: . docusate sodium  100 mg Oral BID  . enoxaparin (LOVENOX) injection  90 mg Subcutaneous Q24H   . famotidine  20 mg Oral BID  . methylPREDNISolone (SOLU-MEDROL) injection  40 mg Intravenous Daily  . sodium chloride flush  10-40 mL Intracatheter Q12H  . vitamin C  500 mg Oral Daily  . zinc sulfate  220 mg Oral Daily   Continuous Infusions: . sodium chloride 250 mL (05/27/19 1805)  . remdesivir 100 mg in NS 250 mL 100 mg (05/28/19 1753)   PRN Meds:.sodium chloride, acetaminophen, guaiFENesin-dextromethorphan, HYDROcodone-acetaminophen, ipratropium, sodium chloride flush, SUMAtriptan  Micro Results Recent Results (from the past 240 hour(s))  Blood Culture (routine x 2)     Status: None (Preliminary result)   Collection Time: 05/25/19 10:40 PM   Specimen: BLOOD  Result Value Ref Range Status   Specimen Description   Final    BLOOD BLOOD RIGHT ARM Performed at [facility], [address] [place], [place] [address]    Special Requests   Final    BOTTLES DRAWN AEROBIC AND ANAEROBIC Blood Culture adequate volume Performed at [facility], [address] [place], [place] [address]    Culture   Final    NO GROWTH 3 DAYS Performed at [facility], [place] [address], [place], [place] [address]    Report Status PENDING  Incomplete  SARS Coronavirus 2 (Hosp order,Performed in [facility] lab via [employer] ID)     Status: Abnormal   Collection Time: 05/25/19 10:42 PM   Specimen: Dry Nasal Swab ([employer] ID Now)  Result Value Ref Range Status   SARS Coronavirus 2 ([employer] ID Now) POSITIVE (A) NEGATIVE Final    Comment: RESULT CALLED TO, READ BACK BY AND VERIFIED WITH: ADKINS LISA RN AT 2339 ON 05/25/19 BY [email or web address] (NOTE) Interpretive Result Comment(s): COVID 19 Positive SARS CoV 2 target nucleic acids are DETECTED. The SARS CoV 2 RNA is generally detectable in upper and lower respiratory specimens during the acute phase of infection.  Positive results are indicative of active infection with SARS CoV 2.  Clinical correlation with patient history and other  diagnostic information is necessary to determine patient infection status.  Positive results do not rule out bacterial infection or coinfection with other viruses. The expected result is Negative. COVID 19 Negative SARS CoV 2 target nucleic acids are NOT DETECTED. The SARS CoV 2 RNA is generally detectable in upper and lower respiratory specimens during the acute phase of infection.  Negative results do not preclude SARS CoV 2 infection, do not rule out coinfections with other pathogens, and should not be used as the sole basis for t reatment or other patient management decisions.  Negative results must be combined with clinical observations, patient history, and epidemiological information. The expected result is Negative. Invalid Presence or absence of SARS CoV 2 nucleic acids cannot be determined. Repeat testing was performed on the submitted specimen and repeated Invalid results were obtained.  If clinically indicated, additional testing on a new specimen with an alternate test methodology [phone]) is advised.  The SARS CoV 2 RNA is generally detectable in upper and lower respiratory specimens during the acute phase of infection. The expected result is Negative. Fact Sheet for Patients:  GolfingFamily.no Fact Sheet for Healthcare Providers: https://www.hernandez-brewer.com/ This test is not yet approved or  cleared by the [place] and has been authorized for detection and/or diagnosis of SARS CoV 2 by FDA under an Emergency Use Authorization (EUA).  T his EUA will remain in effect (meaning this test can be used) for the duration of the COVID19 declaration under Section 564(b)(1) of the Act, 21 U.S.C. section [phone] 3(b)(1), unless the authorization is terminated or revoked sooner. Performed at [facility], [address] [place], [place] [address]   Blood Culture (routine x 2)     Status: None (Preliminary result)    Collection Time: 05/25/19 10:55 PM   Specimen: BLOOD  Result Value Ref Range Status   Specimen Description   Final    BLOOD BLOOD LEFT ARM Performed at [facility], [address] [place], [place] [address]    Special Requests   Final    BOTTLES DRAWN AEROBIC AND ANAEROBIC Blood Culture adequate volume Performed at [facility], [address] [place], [place] [address]    Culture   Final    NO GROWTH 3 DAYS Performed at [facility], [place] [address] [place], [place] [address]    Report Status PENDING  Incomplete    Radiology Reports Dg Chest Port 1 View  Result Date: 05/25/2019 CLINICAL DATA:  Shortness of breath, cough and fever EXAM: PORTABLE CHEST 1 VIEW COMPARISON:  07/29/2015 FINDINGS: Mild cardiomegaly. Patchy bilateral interstitial and ground-glass opacity. No pneumothorax. IMPRESSION: 1. Patchy bilateral interstitial and ground-glass opacity, consider atypical or viral pneumonia 2. Mild cardiomegaly Electronically Signed   By: Donavan Foil M.D.   On: 05/25/2019 22:31   Dg Abd Portable 1v  Result Date: 05/28/2019 CLINICAL DATA:  Severe lower abdominal pain EXAM: PORTABLE ABDOMEN - 1 VIEW COMPARISON:  CT 05/30/2015 FINDINGS: Surgical clips in the right upper quadrant. Nonobstructed bowel-gas pattern. No radiopaque calculi. IMPRESSION: Negative. Electronically Signed   By: Donavan Foil M.D.   On: 05/28/2019 16:08   [place] Ekg Site Rite  Result Date: 05/26/2019 If Site Rite image not attached, placement could not be confirmed due to current cardiac rhythm.   Time Spent in minutes  25 minutes   Phillips Climes M.D on 05/29/2019 at 3:47 PM  Between 7am to 7pm - Pager - [phone]  After 7pm go to [email or web address] - password [facility]  [facility] -  Office  [phone]

## 2019-05-30 MED ORDER — ZINC SULFATE 220 (50 ZN) MG PO CAPS: 220.0000 mg | ORAL_CAPSULE | Freq: Every day | ORAL | Status: DC

## 2019-05-30 MED ORDER — ASCORBIC ACID 500 MG PO TABS: 500.0000 mg | ORAL_TABLET | Freq: Every day | ORAL | Status: DC

## 2019-05-30 NOTE — Social Work (Signed)
CSW received call from [place] regarding pt lack of transportation home. CSW spoke with CSW AD; for pts that do not have safe transportation home; and are positive and unable to take Uber/Lyft they will be discharged via [facility].   Discussed with the bedside RN. Will call for 6pm. If follow up needed regarding pick up please call [facility] at [phone].  CSW signing off. Please consult if any additional needs arise.  [place], [place] Work [phone]

## 2019-05-30 NOTE — Discharge Planning (Signed)
Patient PICC removed (44 Length) - wrapped in guaze wrap. Noted no bleeding and will observe till [facility] picks up to bring home.  Last dose of Remdesivir completed.  Tele removed.  RN assessment and VS revealed stability for DC to home.  Discussed suggested FU appt with PCP and Vit C and zinc to PU at pharmacy.  Discussed self care at home and need to continue social distancing, no working, wearing a mask while out of the house and to limited exposure.  Patientt agreed and signed [facility] contract and placed in chart. Waiting on [facility] to arrive.

## 2019-05-30 NOTE — Discharge Summary (Signed)
Kristin Boyd, is a 38 y.o. female  DOB 1981/10/10  MRN [id number].  Admission date:  05/25/2019  Admitting Physician  Barb Merino, MD  Discharge Date:  05/30/2019   Primary MD  Patient, No Pcp Per  Recommendations for primary care physician for things to follow Check CBC, BMP, LFTs during next visit   Admission Diagnosis  Hypoxia [R09.02] Coronavirus infection [B34.2]   Discharge Diagnosis  Hypoxia [R09.02] Coronavirus infection [B34.2]    Active Problems:   Pneumonia due to COVID-19 virus   Acute respiratory failure [facility])      Past Medical History:  Diagnosis Date  . Chronic knee pain   . Chronic pelvic pain in female   . Obese     Past Surgical History:  Procedure Laterality Date  . CESAREAN SECTION    . CHOLECYSTECTOMY    . ENDOMETRIAL ABLATION         History of present illness and  Hospital Course:     Kindly see H&P for history of present illness and admission details, please review complete Labs, Consult reports and Test reports for all details in brief  HPI  from the history and physical done on the day of admission 05/26/2019  Giovanna Kemmerer  is a 38 y.o. 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 -In ED patient was hypoxic in high 80s at rest, requiring oxygen, on presentation she was on 4 L nasal cannula, I was able to wean her to 9 L nasal cannula, chest x-ray with bilateral opacities, she  has elevated CRP, D-dimers, was positive for COVID-19, she was transferred to [facility] for further management.   Hospital Course   AcutreHypoxic respiratory failure due to COVID-19 pneumonia -In ED patient was on 4 L nasal cannula, she is weaned to room. - chest X-ray with bilateral opacities/infiltrate -treated IV Solu-Medrol, tapering, transition to p.o. prednisone, no further steroids on discharge -treated with IV Remdesivir x 5 doses -Inflammatory markers trending down -Proalcitonin within normal limit, no evidence of bacterial infection, no indication for antibiotics -Leukocytosis related to steroids, she is afebrile, nontoxic-appearing  COVID-19 Labs  Recent Labs    05/28/19 0310 05/29/19 0340  DDIMER 0.55* 0.55*  FERRITIN 161 171  CRP 1.8* 1.0*    Lab Results  Component Value Date   SARSCOV2NAA POSITIVE (A) 05/25/2019    abd Pain -Patient had an episode of abdominal pain earlier today, currently much improved, reports this is chronic recurrent issue in the past secondary to" chronic uterus scarring" -Lipase within normal limit.   x-ray with no acute findings -resolved  Morbid  obesity -Her BMI is 59    Discharge Condition:  Stable   Follow UP      Discharge Instructions  and  Discharge Medications    Discharge Instructions    Discharge instructions   Complete by: As directed    Follow with Primary MD  in 10 days   Get CBC, CMP, 2 view Chest X ray checked  by Primary MD next visit.    Activity: As tolerated    Disposition Home    Diet: Regular diet   On your next visit with your primary care physician please Get Medicines reviewed and adjusted.   Please request your Prim.MD to go over all Hospital Tests and Procedure/Radiological results at the follow up, please get all Hospital records sent to your Prim MD by signing hospital release before you go home.   If you experience worsening of your admission symptoms, develop  shortness of breath, life threatening emergency, suicidal or homicidal thoughts you must seek medical attention immediately by calling 911 or calling your MD immediately  if symptoms less severe.  You Must read complete instructions/literature along with all the possible adverse reactions/side effects for all the Medicines you take and that have been prescribed to you. Take any new Medicines after you have completely understood and accpet all the possible adverse reactions/side effects.   Do not drive, operating heavy machinery, perform activities at heights, swimming or participation in water activities or provide baby sitting services if your were admitted for syncope or siezures until you have seen by Primary MD or a Neurologist and advised to do so again.  Do not drive when taking Pain medications.    Do not take more than prescribed Pain, Sleep and Anxiety Medications  Special Instructions: If you have smoked or chewed Tobacco  in the last 2 yrs please stop smoking, stop any regular Alcohol  and or any Recreational drug use.  Wear Seat belts while driving.   Please note  You were cared for by a hospitalist during your hospital stay. If you have any questions about your discharge medications or the care you received while you were in the hospital after you are discharged, you can call the unit and asked to speak with the hospitalist on call if the hospitalist that took care of you is not available. Once you are discharged, your primary care physician will handle any further medical issues. Please note that NO REFILLS for any discharge medications will be authorized once you are discharged, as it is imperative that you return to your primary care physician (or establish a relationship with a primary care physician if you do not have one) for your aftercare needs so that they can reassess your need for medications and monitor your lab values.   Increase activity slowly   Complete by: As directed       Allergies as of 05/30/2019      Reactions   Peanut (diagnostic) Anaphylaxis   Penicillins Anaphylaxis   Has patient had a PCN reaction causing immediate rash, facial/tongue/throat swelling, SOB or lightheadedness with hypotension: Yes Has patient had a PCN reaction causing severe rash involving mucus membranes or skin necrosis: Unknown Has patient had a PCN reaction that required hospitalization: Yes Has patient had a PCN reaction occurring within the last 10 years: Yes If all of the above answers are "NO", then may proceed with Cephalosporin use.      Medication List    STOP taking these medications   naproxen 500 MG  tablet Commonly known as: NAPROSYN   nitrofurantoin (macrocrystal-monohydrate) 100 MG capsule Commonly known as: MACROBID     TAKE these medications   ascorbic acid 500 MG tablet Commonly known as: VITAMIN C Take 1 tablet (500 mg total) by mouth daily. Please take for 10 days Start taking on: May 31, 2019   zinc sulfate 220 (50 Zn) MG capsule Take 1 capsule (220 mg total) by mouth daily. Take for 10 days Start taking on: May 31, 2019         Diet and Activity recommendation: See Discharge Instructions above   Consults obtained - none   Major procedures and Radiology Reports - PLEASE review detailed and final reports for all details, in brief -      Dg Chest Port 1 View  Result Date: 05/25/2019 CLINICAL DATA:  Shortness of breath, cough and fever EXAM: PORTABLE CHEST 1 VIEW COMPARISON:  07/29/2015 FINDINGS: Mild cardiomegaly. Patchy bilateral interstitial and ground-glass opacity. No pneumothorax. IMPRESSION: 1. Patchy bilateral interstitial and ground-glass opacity, consider atypical or viral pneumonia 2. Mild cardiomegaly Electronically Signed   By: Donavan Foil M.D.   On: 05/25/2019 22:31   Dg Abd Portable 1v  Result Date: 05/28/2019 CLINICAL DATA:  Severe lower abdominal pain EXAM: PORTABLE ABDOMEN - 1 VIEW COMPARISON:  CT 05/30/2015  FINDINGS: Surgical clips in the right upper quadrant. Nonobstructed bowel-gas pattern. No radiopaque calculi. IMPRESSION: Negative. Electronically Signed   By: Donavan Foil M.D.   On: 05/28/2019 16:08   [place] Ekg Site Rite  Result Date: 05/26/2019 If Site Rite image not attached, placement could not be confirmed due to current cardiac rhythm.   Micro Results    Recent Results (from the past 240 hour(s))  Blood Culture (routine x 2)     Status: None (Preliminary result)   Collection Time: 05/25/19 10:40 PM   Specimen: BLOOD  Result Value Ref Range Status   Specimen Description   Final    BLOOD BLOOD RIGHT ARM Performed at [facility], [address] [place], [place] [address]    Special Requests   Final    BOTTLES DRAWN AEROBIC AND ANAEROBIC Blood Culture adequate volume Performed at [facility], [address] [place], [place] [address]    Culture   Final    NO GROWTH 4 DAYS Performed at [facility], [place] [address] [place], [place] [address]    Report Status PENDING  Incomplete  SARS Coronavirus 2 (Hosp order,Performed in [facility] lab via [employer] ID)     Status: Abnormal   Collection Time: 05/25/19 10:42 PM   Specimen: Dry Nasal Swab (Abbott ID Now)  Result Value Ref Range Status   SARS Coronavirus 2 (Abbott ID Now) POSITIVE (A) NEGATIVE Final    Comment: RESULT CALLED TO, READ BACK BY AND VERIFIED WITH: ADKINS LISA RN AT 2339 ON 05/25/19 BY [email or web address] (NOTE) Interpretive Result Comment(s): COVID 19 Positive SARS CoV 2 target nucleic acids are DETECTED. The SARS CoV 2 RNA is generally detectable in upper and lower respiratory specimens during the acute phase of infection.  Positive results are indicative of active infection with SARS CoV 2.  Clinical correlation with patient history and other diagnostic information is necessary to determine patient infection status.  Positive results do not rule out bacterial infection or coinfection with  other viruses. The expected result is Negative. COVID 19 Negative SARS CoV 2 target nucleic acids are NOT DETECTED. The SARS CoV 2 RNA is  generally detectable in upper and lower respiratory specimens during the acute phase of infection.  Negative results do not preclude SARS CoV 2 infection, do not rule out coinfections with other pathogens, and should not be used as the sole basis for t reatment or other patient management decisions.  Negative results must be combined with clinical observations, patient history, and epidemiological information. The expected result is Negative. Invalid Presence or absence of SARS CoV 2 nucleic acids cannot be determined. Repeat testing was performed on the submitted specimen and repeated Invalid results were obtained.  If clinically indicated, additional testing on a new specimen with an alternate test methodology [phone]) is advised.  The SARS CoV 2 RNA is generally detectable in upper and lower respiratory specimens during the acute phase of infection. The expected result is Negative. Fact Sheet for Patients:  GolfingFamily.no Fact Sheet for Healthcare Providers: https://www.hernandez-brewer.com/ This test is not yet approved or cleared by the [place] FDA and has been authorized for detection and/or diagnosis of SARS CoV 2 by FDA under an Emergency Use Authorization (EUA).  T his EUA will remain in effect (meaning this test can be used) for the duration of the COVID19 declaration under Section 564(b)(1) of the Act, 21 U.S.C. section [phone] 3(b)(1), unless the authorization is terminated or revoked sooner. Performed at [facility], [address] [place], [place] [address]   Blood Culture (routine x 2)     Status: None (Preliminary result)   Collection Time: 05/25/19 10:55 PM   Specimen: BLOOD  Result Value Ref Range Status   Specimen Description   Final    BLOOD BLOOD LEFT ARM Performed  at [facility], [address] [place], [place] [address]    Special Requests   Final    BOTTLES DRAWN AEROBIC AND ANAEROBIC Blood Culture adequate volume Performed at [facility], [address] [place], [place] [address]    Culture   Final    NO GROWTH 4 DAYS Performed at [facility], [place] [address] [place], [place] [address]    Report Status PENDING  Incomplete       Today   Subjective:   Nishika Parkhurst today has no headache,no chest abdominal pain,no new weakness tingling or numbness, feels much better wants to go home today.   Objective:   Blood pressure (!) 142/93, pulse 87, temperature 97.7 F (36.5 C), temperature source Oral, resp. rate (!) 23, height 5\' 9"  (1.753 m), weight (!) 184.2 kg, SpO2 93 %.   Intake/Output Summary (Last 24 hours) at 05/30/2019 1230 Last data filed at 05/29/2019 2045 Gross per 24 hour  Intake 120 ml  Output -  Net 120 ml    Exam Awake Alert, Oriented x 3, No new F.N deficits, Normal affect Symmetrical Chest wall movement, Good air movement bilaterally, CTAB RRR,No Gallops,Rubs or new Murmurs, No Parasternal Heave +ve B.Sounds, Abd Soft, Non tender, No rebound -guarding or rigidity. No Cyanosis, Clubbing or edema, No new Rash or bruise  Data Review   CBC w Diff:  Lab Results  Component Value Date   WBC 15.6 (H) 05/29/2019   HGB 13.7 05/29/2019   HCT 43.4 05/29/2019   PLT 339 05/29/2019   LYMPHOPCT 23 05/29/2019   MONOPCT 6 05/29/2019   EOSPCT 0 05/29/2019   BASOPCT 0 05/29/2019    CMP:  Lab Results  Component Value Date   NA 144 05/29/2019   K 3.6 05/29/2019  CL 106 05/29/2019   CO2 28 05/29/2019   BUN 15 05/29/2019   CREATININE 0.58 05/29/2019   PROT 7.6 05/29/2019   ALBUMIN 3.6 05/29/2019   BILITOT 0.4 05/29/2019   ALKPHOS 55 05/29/2019   AST 17 05/29/2019   ALT 28 05/29/2019  .   Total Time in preparing paper work, data evaluation and todays exam - 64 minutes  Phillips Climes M.D on 05/30/2019 at 12:30 PM  [facility]   Office  [phone]

## 2019-05-30 NOTE — Care Management (Signed)
Case manager has arranged follow up tele visit appointment for the patient with [facility]. June 06, 2019 at 9:00am with Dr. Asencion Noble. This appointment has been placed on AVS.    Ricki Miller, RN BSN Case Manager [phone]

## 2019-05-30 NOTE — Plan of Care (Signed)
Patient is ready to be discharged, but says she has no way to get home.  Was planning on calling [facility] or taxi, but was informed that would not be possible. She states that her family is afraid and does not want to risk being exposed.  Social work contacted to try to assist with education and options.

## 2019-05-30 NOTE — Discharge Instructions (Signed)
Person Under Monitoring Name: Kristin Boyd  Location: [place] [address]   Infection Prevention Recommendations for Individuals Confirmed to have, or Being Evaluated for, 2019 Novel Coronavirus (COVID-19) Infection Who Receive Care at Home  Individuals who are confirmed to have, or are being evaluated for, COVID-19 should follow the prevention steps below until a healthcare provider or local or state health department says they can return to normal activities.  Stay home except to get medical care You should restrict activities outside your home, except for getting medical care. Do not go to work, school, or public areas, and do not use public transportation or taxis.  Call ahead before visiting your doctor Before your medical appointment, call the healthcare provider and tell them that you have, or are being evaluated for, COVID-19 infection. This will help the healthcare providers office take steps to keep other people from getting infected. Ask your healthcare provider to call the local or state health department.  Monitor your symptoms Seek prompt medical attention if your illness is worsening (e.g., difficulty breathing). Before going to your medical appointment, call the healthcare provider and tell them that you have, or are being evaluated for, COVID-19 infection. Ask your healthcare provider to call the local or state health department.  Wear a facemask You should wear a facemask that covers your nose and mouth when you are in the same room with other people and when you visit a healthcare provider. People who live with or visit you should also wear a facemask while they are in the same room with you.  Separate yourself from other people in your home As much as possible, you should stay in a different room from other people in your home. Also, you should use a separate bathroom, if available.  Avoid sharing household items You should  not share dishes, drinking glasses, cups, eating utensils, towels, bedding, or other items with other people in your home. After using these items, you should wash them thoroughly with soap and water.  Cover your coughs and sneezes Cover your mouth and nose with a tissue when you cough or sneeze, or you can cough or sneeze into your sleeve. Throw used tissues in a lined trash can, and immediately wash your hands with soap and water for at least 20 seconds or use an alcohol-based hand rub.  Wash your [facility] your hands often and thoroughly with soap and water for at least 20 seconds. You can use an alcohol-based hand sanitizer if soap and water are not available and if your hands are not visibly dirty. Avoid touching your eyes, nose, and mouth with unwashed hands.   Prevention Steps for Caregivers and Household Members of Individuals Confirmed to have, or Being Evaluated for, COVID-19 Infection Being Cared for in the Home  If you live with, or provide care at home for, a person confirmed to have, or being evaluated for, COVID-19 infection please follow these guidelines to prevent infection:  Follow healthcare providers instructions Make sure that you understand and can help the patient follow any healthcare provider instructions for all care.  Provide for the patients basic needs You should help the patient with basic needs in the home and provide support for getting groceries, prescriptions, and other personal needs.  Monitor the patients symptoms If they are getting sicker, call his or her medical provider and tell them that the patient has, or is being evaluated for, COVID-19 infection. This will help the healthcare providers  office take steps to keep other people from getting infected. Ask the healthcare provider to call the local or state health department.  Limit the number of people who have contact with the patient  If possible, have only one caregiver for the  patient.  Other household members should stay in another home or place of residence. If this is not possible, they should stay  in another room, or be separated from the patient as much as possible. Use a separate bathroom, if available.  Restrict visitors who do not have an essential need to be in the home.  Keep older adults, very young children, and other sick people away from the patient Keep older adults, very young children, and those who have compromised immune systems or chronic health conditions away from the patient. This includes people with chronic heart, lung, or kidney conditions, diabetes, and cancer.  Ensure good ventilation Make sure that shared spaces in the home have good air flow, such as from an air conditioner or an opened window, weather permitting.  Wash your hands often  Wash your hands often and thoroughly with soap and water for at least 20 seconds. You can use an alcohol based hand sanitizer if soap and water are not available and if your hands are not visibly dirty.  Avoid touching your eyes, nose, and mouth with unwashed hands.  Use disposable paper towels to dry your hands. If not available, use dedicated cloth towels and replace them when they become wet.  Wear a facemask and gloves  Wear a disposable facemask at all times in the room and gloves when you touch or have contact with the patients blood, body fluids, and/or secretions or excretions, such as sweat, saliva, sputum, nasal mucus, vomit, urine, or feces.  Ensure the mask fits over your nose and mouth tightly, and do not touch it during use.  Throw out disposable facemasks and gloves after using them. Do not reuse.  Wash your hands immediately after removing your facemask and gloves.  If your personal clothing becomes contaminated, carefully remove clothing and launder. Wash your hands after handling contaminated clothing.  Place all used disposable facemasks, gloves, and other waste in a lined  container before disposing them with other household waste.  Remove gloves and wash your hands immediately after handling these items.  Do not share dishes, glasses, or other household items with the patient  Avoid sharing household items. You should not share dishes, drinking glasses, cups, eating utensils, towels, bedding, or other items with a patient who is confirmed to have, or being evaluated for, COVID-19 infection.  After the person uses these items, you should wash them thoroughly with soap and water.  Wash laundry thoroughly  Immediately remove and wash clothes or bedding that have blood, body fluids, and/or secretions or excretions, such as sweat, saliva, sputum, nasal mucus, vomit, urine, or feces, on them.  Wear gloves when handling laundry from the patient.  Read and follow directions on labels of laundry or clothing items and detergent. In general, wash and dry with the warmest temperatures recommended on the label.  Clean all areas the individual has used often  Clean all touchable surfaces, such as counters, tabletops, doorknobs, bathroom fixtures, toilets, phones, keyboards, tablets, and bedside tables, every day. Also, clean any surfaces that may have blood, body fluids, and/or secretions or excretions on them.  Wear gloves when cleaning surfaces the patient has come in contact with.  Use a diluted bleach solution (e.g., dilute bleach with 1  part bleach and 10 parts water) or a household disinfectant with a label that says EPA-registered for coronaviruses. To make a bleach solution at home, add 1 tablespoon of bleach to 1 quart (4 cups) of water. For a larger supply, add  cup of bleach to 1 gallon (16 cups) of water.  Read labels of cleaning products and follow recommendations provided on product labels. Labels contain instructions for safe and effective use of the cleaning product including precautions you should take when applying the product, such as wearing gloves or  eye protection and making sure you have good ventilation during use of the product.  Remove gloves and wash hands immediately after cleaning.  Monitor yourself for signs and symptoms of illness Caregivers and household members are considered close contacts, should monitor their health, and will be asked to limit movement outside of the home to the extent possible. Follow the monitoring steps for close contacts listed on the symptom monitoring form.   ? If you have additional questions, contact your local health department or call the epidemiologist on call at [phone] (available 24/7). ? This guidance is subject to change. For the most up-to-date guidance from [facility], please refer to their website: YouBlogs.pl

## 2019-05-31 LAB — CULTURE, BLOOD (ROUTINE X 2)
Culture: NO GROWTH
Culture: NO GROWTH
Special Requests: ADEQUATE
Special Requests: ADEQUATE

## 2019-06-05 NOTE — Progress Notes (Addendum)
Patient ID: Kristin Boyd, female   DOB: Dec 15, 1980, 38 y.o.   MRN: [id number] Virtual Visit via Telephone Note  I connected with Kristin Boyd on 06/06/19 at  9:00 AM EDT by telephone and verified that I am speaking with the correct person using two identifiers.   Consent:  I discussed the limitations, risks, security and privacy concerns of performing an evaluation and management service by telephone and the availability of in person appointments. I also discussed with the patient that there may be a patient responsible charge related to this service. The patient expressed understanding and agreed to proceed.  Location of patient: The patient was at home  Location of provider: I was in my office  Persons participating in the televisit with the patient.   No one else on the call  History of Present Illness:  This is a 38 year old female who was admitted between the 17th and 22 June for COVID-19 respiratory oxygenation failure and pneumonia. This was a telephone post hospital call.  The patient states that since discharge she is completely recovered.  She has no shortness of breath cough chest pain or muscle aches.  She has no headaches fever chills diarrhea or other complaints as well.  Her abdominal pain is completely resolved.  Below is a discharge summary Admission date:  05/25/2019  Admitting Physician  Barb Merino, MD  Discharge Date:  05/30/2019   Primary MD  Patient, No Pcp Per  Recommendations for primary care physician for things to follow Check CBC, BMP, LFTs during next visit   Admission Diagnosis  Hypoxia [R09.02] Coronavirus infection [B34.2]   Discharge Diagnosis  Hypoxia [R09.02] Coronavirus infection [B34.2]    Active Problems:   Pneumonia due to COVID-19 virus   Acute respiratory failure [facility])          Past Medical History:  Diagnosis Date  . Chronic knee pain   . Chronic pelvic pain in female   . Obese          Past Surgical  History:  Procedure Laterality Date  . CESAREAN SECTION    . CHOLECYSTECTOMY    . ENDOMETRIAL ABLATION         History of present illness and  Hospital Course:     Kindly see H&P for history of present illness and admission details, please review complete Labs, Consult reports and Test reports for all details in brief  HPI  from the history and physical done on the day of admission 05/26/2019 ShadebraSimmonsis a37 y.o.female,without significant past medical history, presents with complaints of fever, shortness of breath, and reports over last 4 days she has been having cough, generalized weakness, over the last 4 hours she started to develop fever, and shortness of breath, denies diarrhea, nausea, vomiting, abdominal pain, sick contacts, work, 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 -In ED patient was hypoxic in high 80s at rest, requiring oxygen, on presentation she was on 4 L nasal cannula, I was able to wean her to 9 L nasal cannula, chest x-ray with bilateral opacities, she has elevated CRP, D-dimers, was positive for COVID-19, she was transferred to [facility] for further management.   Hospital Course   AcutreHypoxic respiratory failure due to COVID-19 pneumonia -In ED patient was on 4 L nasal cannula, she is weaned to room. - chest X-ray with bilateral opacities/infiltrate -treated IV  Solu-Medrol, tapering,transition to p.o. prednisone, no further steroids on discharge -treated with IV Remdesivir x 5 doses -Inflammatory markers trending down -Proalcitonin within normal limit, no evidence of bacterial infection, no indication for antibiotics -Leukocytosis related to steroids, she is afebrile, nontoxic-appearing  COVID-19 Labs  Recent Labs (last 2 labs)       Recent Labs    05/28/19 0310  05/29/19 0340  DDIMER 0.55* 0.55*  FERRITIN 161 171  CRP 1.8* 1.0*      Recent Labs       Lab Results  Component Value Date   SARSCOV2NAA POSITIVE (A) 05/25/2019      abd Pain -Patient had an episode of abdominal pain earlier today, currently much improved, reports this is chronic recurrent issue in the past secondary to" chronic uterus scarring" -Lipase within normal limit.x-ray with no acute findings -resolved  Morbid obesity -Her BMI is 59   Note the patient does not have any active lab abnormalities.  The patient does not recall any contacts where she may have acquired the virus.  She has been staying in isolation since discharge.  Her illness began on 11 June.  Her test was positive on 17 June.  Pos in BOLD Constitutional:   No  weight loss, night sweats,  Fevers, chills, fatigue, lassitude. HEENT:   No headaches,  Difficulty swallowing,  Tooth/dental problems,  Sore throat,                No sneezing, itching, ear ache, nasal congestion, post nasal drip,   CV:  No chest pain,  Orthopnea, PND, swelling in lower extremities, anasarca, dizziness, palpitations  GI  No heartburn, indigestion, abdominal pain, nausea, vomiting, diarrhea, change in bowel habits, loss of appetite  Resp: No shortness of breath with exertion or at rest.  No excess mucus, no productive cough,  No non-productive cough,  No coughing up of blood.  No change in color of mucus.  No wheezing.  No chest wall deformity  Skin: no rash or lesions.  GU: no dysuria, change in color of urine, no urgency or frequency.  No flank pain.  MS:  No joint pain or swelling.  No decreased range of motion.  No back pain.  Psych:  No change in mood or affect. No depression or anxiety.  No memory loss.  Observations/Objective: No observations as this was a telephone visit BMP Latest Ref Rng & Units 05/29/2019 05/28/2019 05/27/2019  Glucose 70 - 99 mg/dL 113(H) 141(H) 146(H)  BUN 6 - 20 mg/dL 15 13 11    Creatinine 0.44 - 1.00 mg/dL 0.58 0.64 0.68  Sodium 135 - 145 mmol/L 144 144 141  Potassium 3.5 - 5.1 mmol/L 3.6 4.3 4.4  Chloride 98 - 111 mmol/L 106 108 106  CO2 22 - 32 mmol/L 28 24 24   Calcium 8.9 - 10.3 mg/dL 8.6(L) 8.8(L) 9.0   Hepatic Function Panel     Component Value Date/Time   PROT 7.6 05/29/2019 0340   ALBUMIN 3.6 05/29/2019 0340   AST 17 05/29/2019 0340   ALT 28 05/29/2019 0340   ALKPHOS 55 05/29/2019 0340   BILITOT 0.4 05/29/2019 0340   CBC Latest Ref Rng & Units 05/29/2019 05/28/2019 05/27/2019  WBC 4.0 - 10.5 K/uL 15.6(H) 14.5(H) 4.1  Hemoglobin 12.0 - 15.0 g/dL 13.7 13.7 14.4  Hematocrit 36.0 - 46.0 % 43.4 44.6 47.5(H)  Platelets 150 - 400 K/uL 339 325 266   Assessment and Plan: #1 COVID-19 pneumonia with hypoxia and acute respiratory failure now resolved  The patient does not need any additional medical follow-up  The patient can now be released from isolation  I gave the patient telephone numbers and contact information if she wishes to call for primary care to establish  #2 morbid obesity  We did not specifically address this problem at this visit No other needs were identified on the call and no additional follow-up indicated  Follow Up Instructions: The patient knows to call back if her symptoms recur The patient was given information for primary care access   I discussed the assessment and treatment plan with the patient. The patient was provided an opportunity to ask questions and all were answered. The patient agreed with the plan and demonstrated an understanding of the instructions.   The patient was advised to call back or seek an in-person evaluation if the symptoms worsen or if the condition fails to improve as anticipated.  I provided 20 minutes of non-face-to-face time during this encounter  including  median intraservice time , review of notes, labs, imaging, medications  and explaining diagnosis and management to the patient .    Asencion Noble, MD

## 2019-06-06 ENCOUNTER — Ambulatory Visit: Payer: HRSA Program | Attending: Critical Care Medicine | Admitting: Critical Care Medicine

## 2019-06-06 ENCOUNTER — Encounter: Payer: Self-pay | Admitting: Critical Care Medicine

## 2019-06-06 ENCOUNTER — Other Ambulatory Visit: Payer: Self-pay

## 2019-06-06 DIAGNOSIS — U071 COVID-19: Secondary | ICD-10-CM | POA: Diagnosis not present

## 2019-06-06 DIAGNOSIS — J9601 Acute respiratory failure with hypoxia: Secondary | ICD-10-CM | POA: Diagnosis not present

## 2019-06-06 DIAGNOSIS — J1289 Other viral pneumonia: Secondary | ICD-10-CM

## 2019-06-09 ENCOUNTER — Encounter: Payer: Self-pay | Admitting: Critical Care Medicine

## 2019-11-24 ENCOUNTER — Emergency Department (HOSPITAL_BASED_OUTPATIENT_CLINIC_OR_DEPARTMENT_OTHER)
Admission: EM | Admit: 2019-11-24 | Discharge: 2019-11-24 | Disposition: A | Discharge status: Home / Self Care | Payer: Self-pay | Attending: Emergency Medicine | Admitting: Emergency Medicine

## 2019-11-24 ENCOUNTER — Other Ambulatory Visit: Payer: Self-pay

## 2019-11-24 ENCOUNTER — Encounter (HOSPITAL_BASED_OUTPATIENT_CLINIC_OR_DEPARTMENT_OTHER): Payer: Self-pay

## 2019-11-24 DIAGNOSIS — Z8619 Personal history of other infectious and parasitic diseases: Secondary | ICD-10-CM | POA: Insufficient documentation

## 2019-11-24 DIAGNOSIS — B349 Viral infection, unspecified: Secondary | ICD-10-CM | POA: Insufficient documentation

## 2019-11-24 LAB — SARS CORONAVIRUS 2 (TAT 6-24 HRS): SARS Coronavirus 2: NEGATIVE

## 2019-11-24 NOTE — Discharge Instructions (Addendum)
You were evaluated in the Emergency Department and after careful evaluation, we did not find any emergent condition requiring admission or further testing in the hospital.  Your exam/testing today is overall reassuring.  Your symptoms seem to be due to a viral illness, possibly coronavirus.  We have tested you for the coronavirus here in the Emergency Department.  Please isolate or quarantine at home until you receive a negative test result.  If positive, we recommend continued home quarantine per CDC recommendations.   Please return to the Emergency Department if you experience any worsening of your condition.  We encourage you to follow up with a primary care provider.  Thank you for allowing us to be a part of your care. 

## 2019-11-24 NOTE — ED Triage Notes (Signed)
Pt states that she had COVID back in June. Reports that her son was tested positive on Tuesday of this week and she has been having a cough, with concerns of returning [place].

## 2019-11-24 NOTE — ED Provider Notes (Signed)
[facility] Emergency Department Provider Note MRN:  [id number]  Arrival date & time: 11/24/19     Chief Complaint   Cough   History of Present Illness   Kristin Boyd is a 38 y.o. year-old female with a history of COVID-19 pneumonia presenting to the ED with chief complaint of cough.  Patient explains that she was admitted for COVID-19 with pneumonia back in June.  Her son also had COVID-19 at that time.  She is here today for repeat Covid testing because she and her son have been having cough and cold-like symptoms and her son recently tested positive again for COVID-19.  She denies chest pain or shortness of breath, no fever, no abdominal pain, no vomiting, no diarrhea.  Review of Systems  A complete 10 system review of systems was obtained and all systems are negative except as noted in the HPI and PMH.   Patient's Health History    Past Medical History:  Diagnosis Date  . Chronic knee pain   . Chronic pelvic pain in female   . Obese   . Pneumonia due to COVID-19 virus 05/26/2019    Past Surgical History:  Procedure Laterality Date  . CESAREAN SECTION    . CHOLECYSTECTOMY    . ENDOMETRIAL ABLATION      History reviewed. No pertinent family history.  Social History   Socioeconomic History  . Marital status: Single    Spouse name: Not on file  . Number of children: Not on file  . Years of education: Not on file  . Highest education level: Not on file  Occupational History  . Not on file  Tobacco Use  . Smoking status: Former Research scientist (life sciences)  . Smokeless tobacco: Never Used  Substance and Sexual Activity  . Alcohol use: Not Currently    Comment: occasional  . Drug use: Never  . Sexual activity: Not on file  Other Topics Concern  . Not on file  Social History Narrative  . Not on file   Social Determinants of Health   Financial Resource Strain:   . Difficulty of Paying Living Expenses: Not on file  Food Insecurity:   . Worried About  Charity fundraiser in the Last Year: Not on file  . Ran Out of Food in the Last Year: Not on file  Transportation Needs:   . Lack of Transportation (Medical): Not on file  . Lack of Transportation (Non-Medical): Not on file  Physical Activity:   . Days of Exercise per Week: Not on file  . Minutes of Exercise per Session: Not on file  Stress:   . Feeling of Stress : Not on file  Social Connections:   . Frequency of Communication with Friends and Family: Not on file  . Frequency of Social Gatherings with Friends and Family: Not on file  . Attends Religious Services: Not on file  . Active Member of Clubs or Organizations: Not on file  . Attends Archivist Meetings: Not on file  . Marital Status: Not on file  Intimate Partner Violence:   . Fear of Current or Ex-Partner: Not on file  . Emotionally Abused: Not on file  . Physically Abused: Not on file  . Sexually Abused: Not on file     Physical Exam  Vital Signs and Nursing Notes reviewed Vitals:   11/24/19 1247  BP: (!) 185/105  Pulse: 94  Resp: 18  Temp: 98.2 F (36.8 C)  SpO2: 95%    CONSTITUTIONAL:  Well-appearing, NAD NEURO:  Alert and oriented x 3, no focal deficits EYES:  eyes equal and reactive ENT/NECK:  no LAD, no JVD CARDIO: Regular rate, well-perfused, normal S1 and S2 PULM:  CTAB no wheezing or rhonchi GI/GU:  normal bowel sounds, non-distended, non-tender MSK/SPINE:  No gross deformities, no edema SKIN:  no rash, atraumatic PSYCH:  Appropriate speech and behavior  Diagnostic and Interventional Summary    EKG Interpretation  Date/Time:    Ventricular Rate:    PR Interval:    QRS Duration:   QT Interval:    QTC Calculation:   R Axis:     Text Interpretation:        Labs Reviewed  SARS CORONAVIRUS 2 (TAT 6-24 HRS)    No orders to display    Medications - No data to display   Procedures  /  Critical Care Procedures  ED Course and Medical Decision Making  I have reviewed the triage  vital signs and the nursing notes.  Pertinent labs & imaging results that were available during my care of the patient were reviewed by me and considered in my medical decision making (see below for details).     Suspect viral process, possibly a repeat COVID-19 infection.  Lungs are clear, no increased work of breathing, normal vital signs, largely without complaints other than cough.  She is appropriate for repeat testing and discharge.  Kristin Boyd was evaluated in Emergency Department on 11/24/2019 for the symptoms described in the history of present illness. She was evaluated in the context of the global COVID-19 pandemic, which necessitated consideration that the patient might be at risk for infection with the SARS-CoV-2 virus that causes COVID-19. Institutional protocols and algorithms that pertain to the evaluation of patients at risk for COVID-19 are in a state of rapid change based on information released by regulatory bodies including the CDC and federal and state organizations. These policies and algorithms were followed during the patient's care in the ED.   Barth Kirks. Sedonia Small, [place] [email or web address]  Final Clinical Impressions(s) / ED Diagnoses     ICD-10-CM   1. Viral illness  B34.9     ED Discharge Orders    None       Discharge Instructions Discussed with and Provided to Patient:     Discharge Instructions     You were evaluated in the Emergency Department and after careful evaluation, we did not find any emergent condition requiring admission or further testing in the hospital.  Your exam/testing today is overall reassuring.  Your symptoms seem to be due to a viral illness, possibly coronavirus.  We have tested you for the coronavirus here in the Emergency Department.  Please isolate or quarantine at home until you receive a negative test result.  If positive, we recommend continued home quarantine per [facility]  recommendations.   Please return to the Emergency Department if you experience any worsening of your condition.  We encourage you to follow up with a primary care provider.  Thank you for allowing [place] to be a part of your care.      Maudie Flakes, MD 11/24/19 1326

## 2020-03-14 ENCOUNTER — Other Ambulatory Visit: Payer: Self-pay

## 2020-03-14 ENCOUNTER — Encounter (HOSPITAL_BASED_OUTPATIENT_CLINIC_OR_DEPARTMENT_OTHER): Payer: Self-pay

## 2020-03-14 ENCOUNTER — Emergency Department (HOSPITAL_BASED_OUTPATIENT_CLINIC_OR_DEPARTMENT_OTHER)
Admission: EM | Admit: 2020-03-14 | Discharge: 2020-03-14 | Disposition: A | Discharge status: Home / Self Care | Payer: Self-pay | Attending: Emergency Medicine | Admitting: Emergency Medicine

## 2020-03-14 DIAGNOSIS — M545 Low back pain, unspecified: Secondary | ICD-10-CM

## 2020-03-14 DIAGNOSIS — Y999 Unspecified external cause status: Secondary | ICD-10-CM | POA: Insufficient documentation

## 2020-03-14 DIAGNOSIS — Y9389 Activity, other specified: Secondary | ICD-10-CM | POA: Insufficient documentation

## 2020-03-14 DIAGNOSIS — Y92410 Unspecified street and highway as the place of occurrence of the external cause: Secondary | ICD-10-CM | POA: Insufficient documentation

## 2020-03-14 MED ORDER — METHOCARBAMOL 500 MG PO TABS: 500.0000 mg | ORAL_TABLET | Freq: Two times a day (BID) | ORAL | 0 refills | Status: DC

## 2020-03-14 MED ORDER — LIDOCAINE 5 % EX PTCH: 1.0000 | MEDICATED_PATCH | CUTANEOUS | 0 refills | Status: DC

## 2020-03-14 MED ORDER — ACETAMINOPHEN 325 MG PO TABS
650.0000 mg | ORAL_TABLET | Freq: Once | ORAL | Status: AC
  Administered 2020-03-14: 650 mg via ORAL
  Filled 2020-03-14: qty 2
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
## 2021-02-02 ENCOUNTER — Other Ambulatory Visit: Payer: Self-pay

## 2021-02-02 ENCOUNTER — Emergency Department (HOSPITAL_BASED_OUTPATIENT_CLINIC_OR_DEPARTMENT_OTHER)
Admission: EM | Admit: 2021-02-02 | Discharge: 2021-02-02 | Disposition: A | Discharge status: Home / Self Care | Payer: 59 | Attending: Emergency Medicine | Admitting: Emergency Medicine

## 2021-02-02 ENCOUNTER — Encounter (HOSPITAL_BASED_OUTPATIENT_CLINIC_OR_DEPARTMENT_OTHER): Payer: Self-pay | Admitting: *Deleted

## 2021-02-02 DIAGNOSIS — A599 Trichomoniasis, unspecified: Secondary | ICD-10-CM | POA: Insufficient documentation

## 2021-02-02 DIAGNOSIS — R1084 Generalized abdominal pain: Secondary | ICD-10-CM | POA: Diagnosis present

## 2021-02-02 DIAGNOSIS — Z91010 Allergy to peanuts: Secondary | ICD-10-CM | POA: Diagnosis not present

## 2021-02-02 DIAGNOSIS — Z8616 Personal history of COVID-19: Secondary | ICD-10-CM | POA: Diagnosis not present

## 2021-02-02 DIAGNOSIS — Z87891 Personal history of nicotine dependence: Secondary | ICD-10-CM | POA: Insufficient documentation

## 2021-02-02 HISTORY — DX: Unspecified ovarian cyst, unspecified side: N83.209

## 2021-02-02 LAB — CBC WITH DIFFERENTIAL/PLATELET
Abs Immature Granulocytes: 0.04 10*3/uL (ref 0.00–0.07)
Basophils Absolute: 0.1 10*3/uL (ref 0.0–0.1)
Basophils Relative: 0 %
Eosinophils Absolute: 0.2 10*3/uL (ref 0.0–0.5)
Eosinophils Relative: 1 %
HCT: 43.2 % (ref 36.0–46.0)
Hemoglobin: 14.1 g/dL (ref 12.0–15.0)
Immature Granulocytes: 0 %
Lymphocytes Relative: 20 %
Lymphs Abs: 2.7 10*3/uL (ref 0.7–4.0)
MCH: 26.7 pg (ref 26.0–34.0)
MCHC: 32.6 g/dL (ref 30.0–36.0)
MCV: 81.8 fL (ref 80.0–100.0)
Monocytes Absolute: 0.9 10*3/uL (ref 0.1–1.0)
Monocytes Relative: 7 %
Neutro Abs: 9.5 10*3/uL — ABNORMAL HIGH (ref 1.7–7.7)
Neutrophils Relative %: 72 %
Platelets: 292 10*3/uL (ref 150–400)
RBC: 5.28 MIL/uL — ABNORMAL HIGH (ref 3.87–5.11)
RDW: 15.4 % (ref 11.5–15.5)
WBC: 13.4 10*3/uL — ABNORMAL HIGH (ref 4.0–10.5)
nRBC: 0 % (ref 0.0–0.2)

## 2021-02-02 LAB — WET PREP, GENITAL
Clue Cells Wet Prep HPF POC: NONE SEEN
Sperm: NONE SEEN
Yeast Wet Prep HPF POC: NONE SEEN

## 2021-02-02 LAB — COMPREHENSIVE METABOLIC PANEL
ALT: 21 U/L (ref 0–44)
AST: 20 U/L (ref 15–41)
Albumin: 4.2 g/dL (ref 3.5–5.0)
Alkaline Phosphatase: 55 U/L (ref 38–126)
Anion gap: 10 (ref 5–15)
BUN: 9 mg/dL (ref 6–20)
CO2: 23 mmol/L (ref 22–32)
Calcium: 8.4 mg/dL — ABNORMAL LOW (ref 8.9–10.3)
Chloride: 105 mmol/L (ref 98–111)
Creatinine, Ser: 0.72 mg/dL (ref 0.44–1.00)
GFR, Estimated: >60 mL/min (ref >60)
Glucose, Bld: 101 mg/dL — ABNORMAL HIGH (ref 70–99)
Potassium: 3.8 mmol/L (ref 3.5–5.1)
Sodium: 138 mmol/L (ref 135–145)
Total Bilirubin: 1.3 mg/dL — ABNORMAL HIGH (ref 0.3–1.2)
Total Protein: 7.8 g/dL (ref 6.5–8.1)

## 2021-02-02 LAB — LIPASE, BLOOD: Lipase: 21 U/L (ref 11–51)

## 2021-02-02 MED ORDER — METRONIDAZOLE 500 MG PO TABS
2000.0000 mg | ORAL_TABLET | Freq: Once | ORAL | Status: AC
  Administered 2021-02-02: 2000 mg via ORAL
  Filled 2021-02-02: qty 4

## 2021-02-02 MED ORDER — ONDANSETRON HCL 4 MG/2ML IJ SOLN
4.0000 mg | Freq: Once | INTRAMUSCULAR | Status: AC
  Administered 2021-02-02: 4 mg via INTRAVENOUS
  Filled 2021-02-02: qty 2

## 2021-02-02 MED ORDER — DOXYCYCLINE HYCLATE 100 MG PO CAPS: 100.0000 mg | ORAL_CAPSULE | Freq: Two times a day (BID) | ORAL | 0 refills | Status: DC

## 2021-02-02 MED ORDER — MORPHINE SULFATE (PF) 4 MG/ML IV SOLN
4.0000 mg | Freq: Once | INTRAVENOUS | Status: AC
  Administered 2021-02-02: 4 mg via INTRAVENOUS
  Filled 2021-02-02: qty 1

## 2021-02-02 MED ORDER — SODIUM CHLORIDE 0.9 % IV SOLN
1.0000 g | Freq: Once | INTRAVENOUS | Status: AC
  Administered 2021-02-02: 1 g via INTRAVENOUS
  Filled 2021-02-02: qty 10

## 2021-02-02 MED ORDER — DOXYCYCLINE HYCLATE 100 MG PO TABS
100.0000 mg | ORAL_TABLET | Freq: Once | ORAL | Status: AC
  Administered 2021-02-02: 100 mg via ORAL
  Filled 2021-02-02: qty 1

## 2021-02-02 NOTE — ED Triage Notes (Signed)
Pt reports lower abdominal pain since Thursday. Last BM yesterday was normal per pt report. Denies n/v/d. Denies dysuria, vaginal bleeding, and vaginal discharge

## 2021-02-02 NOTE — Discharge Instructions (Signed)
Please follow-up with your OB/GYN for further evaluation of your ovarian mass.  Take the antibiotics as prescribed.  Please abstain from sexual contact until you have completed therapy.  These discuss your positive test with your sexual partner so they can be tested and treated as well.  Take 4 over the counter ibuprofen tablets 3 times a day or 2 over-the-counter naproxen tablets twice a day for pain. Also take tylenol 1000mg (2 extra strength) four times a day.

## 2021-02-02 NOTE — ED Provider Notes (Signed)
[facility] EMERGENCY DEPARTMENT Provider Note   CSN: [id number] Arrival date & time: 02/02/21  1653     History Chief Complaint  Patient presents with  . Abdominal Pain    Kristin Boyd is a 40 y.o. female.  40 yo F with a chief complaints of lower abdominal pain that radiates up to her epigastrium.  This is been going on for the past few days.  No fevers no vomiting no diarrhea.  She denies any urinary symptoms.  Pain is crampy and comes and goes and is sharp at times.  She has had pain like this before that she attributed to an ovarian cyst.  There is some concern that it may have been cancerous and so there was plan for a biopsy but was prior to the pandemic and she is lost to follow-up.  The history is provided by the patient.  Abdominal Pain Pain location:  Suprapubic Pain quality: aching and cramping   Pain radiates to:  Epigastric region Pain severity:  Moderate Onset quality:  Gradual Duration:  3 days Timing:  Constant Progression:  Worsening Chronicity:  New Relieved by:  Nothing Worsened by:  Nothing Ineffective treatments:  None tried Associated symptoms: no chest pain, no chills, no dysuria, no fever, no nausea, no shortness of breath and no vomiting        Past Medical History:  Diagnosis Date  . Chronic knee pain   . Chronic pelvic pain in female   . Obese   . Ovarian cyst   . Pneumonia due to COVID-19 virus 05/26/2019    Patient Active Problem List   Diagnosis Date Noted  . Morbid obesity with BMI of 50.0-59.9, adult ([place]) 06/06/2019    Past Surgical History:  Procedure Laterality Date  . CESAREAN SECTION    . CHOLECYSTECTOMY    . ENDOMETRIAL ABLATION       OB History   No obstetric history on file.     No family history on file.  Social History   Tobacco Use  . Smoking status: Former Research scientist (life sciences)  . Smokeless tobacco: Never Used  Vaping Use  . Vaping Use: Never used  Substance Use Topics  . Alcohol use: Yes    Comment:  occasional  . Drug use: Never    Home Medications Prior to Admission medications   Medication Sig Start Date End Date Taking? Authorizing Provider  doxycycline (VIBRAMYCIN) 100 MG capsule Take 1 capsule (100 mg total) by mouth 2 (two) times daily. One po bid x 7 days 02/02/21  Yes Deno Etienne, DO  lidocaine (LIDODERM) 5 % Place 1 patch onto the skin daily. Remove & Discard patch within 12 hours or as directed by MD 03/14/20   Henderly, Britni A, PA-C  methocarbamol (ROBAXIN) 500 MG tablet Take 1 tablet (500 mg total) by mouth 2 (two) times daily. 03/14/20   Henderly, Britni A, PA-C  vitamin C (VITAMIN C) 500 MG tablet Take 1 tablet (500 mg total) by mouth daily. Please take for 10 days 05/31/19   Elgergawy, Silver Huguenin, MD  zinc sulfate 220 (50 Zn) MG capsule Take 1 capsule (220 mg total) by mouth daily. Take for 10 days 05/31/19   Elgergawy, Silver Huguenin, MD    Allergies    Peanut (diagnostic) and Penicillins  Review of Systems   Review of Systems  Constitutional: Negative for chills and fever.  HENT: Negative for congestion and rhinorrhea.   Eyes: Negative for redness and visual disturbance.  Respiratory: Negative for  shortness of breath and wheezing.   Cardiovascular: Negative for chest pain and palpitations.  Gastrointestinal: Positive for abdominal pain. Negative for nausea and vomiting.  Genitourinary: Negative for dysuria and urgency.  Musculoskeletal: Negative for arthralgias and myalgias.  Skin: Negative for pallor and wound.  Neurological: Negative for dizziness and headaches.    Physical Exam Updated Vital Signs BP 134/72   Pulse 82   Temp 97.9 F (36.6 C) (Oral)   Resp 17   Ht 5\' 9"  (1.753 m)   Wt (!) 179.6 kg   SpO2 95%   BMI 58.48 kg/m   Physical Exam Vitals and nursing note reviewed.  Constitutional:      General: She is not in acute distress.    Appearance: She is well-developed and well-nourished. She is not diaphoretic.     Comments: BMI 59  HENT:     Head:  Normocephalic and atraumatic.  Eyes:     Extraocular Movements: EOM normal.     Pupils: Pupils are equal, round, and reactive to light.  Cardiovascular:     Rate and Rhythm: Normal rate and regular rhythm.     Heart sounds: No murmur heard. No friction rub. No gallop.   Pulmonary:     Effort: Pulmonary effort is normal.     Breath sounds: No wheezing or rales.  Abdominal:     General: There is no distension.     Palpations: Abdomen is soft.     Tenderness: There is abdominal tenderness in the right upper quadrant, epigastric area and left upper quadrant.     Comments: No obvious lower abdominal pain.  Pain mostly to the epigastrium but some mild tenderness to the right upper and left upper quadrant.  Musculoskeletal:        General: No tenderness or edema.     Cervical back: Normal range of motion and neck supple.  Skin:    General: Skin is warm and dry.  Neurological:     Mental Status: She is alert and oriented to person, place, and time.  Psychiatric:        Mood and Affect: Mood and affect normal.        Behavior: Behavior normal.     ED Results / Procedures / Treatments   Labs (all labs ordered are listed, but only abnormal results are displayed) Labs Reviewed  WET PREP, GENITAL - Abnormal; Notable for the following components:      Result Value   Trich, Wet Prep PRESENT (*)    WBC, Wet Prep HPF POC MANY (*)    All other components within normal limits  CBC WITH DIFFERENTIAL/PLATELET - Abnormal; Notable for the following components:   WBC 13.4 (*)    RBC 5.28 (*)    Neutro Abs 9.5 (*)    All other components within normal limits  COMPREHENSIVE METABOLIC PANEL - Abnormal; Notable for the following components:   Glucose, Bld 101 (*)    Calcium 8.4 (*)    Total Bilirubin 1.3 (*)    All other components within normal limits  LIPASE, BLOOD  PREGNANCY, URINE  URINALYSIS, ROUTINE W REFLEX MICROSCOPIC  GC/CHLAMYDIA PROBE AMP ([place]) NOT AT [facility]     EKG None  Radiology No results found.  Procedures Procedures   Medications Ordered in ED Medications  morphine 4 MG/ML injection 4 mg (4 mg Intravenous Given 02/02/21 1732)  ondansetron (ZOFRAN) injection 4 mg (4 mg Intravenous Given 02/02/21 1731)  metroNIDAZOLE (FLAGYL) tablet 2,000 mg (2,000 mg Oral  Given 02/02/21 1831)  doxycycline (VIBRA-TABS) tablet 100 mg (100 mg Oral Given 02/02/21 1832)  cefTRIAXone (ROCEPHIN) 1 g in sodium chloride 0.9 % 100 mL IVPB (0 g Intravenous Stopped 02/02/21 1912)    ED Course  I have reviewed the triage vital signs and the nursing notes.  Pertinent labs & imaging results that were available during my care of the patient were reviewed by me and considered in my medical decision making (see chart for details).    MDM Rules/Calculators/A&P                          40 yo  F with a chief complaints of lower abdominal pain that radiates to her upper abdomen.  Interestingly on my exam she has no lower abdominal tenderness at all seems to be in the upper portion of the abdomen.  With her reporting that it is mostly lower we will perform a pelvic exam.  She does appear somewhat uncomfortable on initial exam will treat with pain and nausea medicine.  Reassess.  Patient is feeling better on reassessment.  She is test positive for trichomonas.  Will treat for possible PID with diffuse abdominal pain that she felt started in her pelvic area.  She is afebrile here tolerating p.o. without issue.  Do not feel that imaging is warranted at this time.  History and exam are inconsistent with ovarian torsion.  We will have her follow-up with an OB/GYN.  7:15 PM:  I have discussed the diagnosis/risks/treatment options with the patient and believe the pt to be eligible for discharge home to follow-up with PCP. We also discussed returning to the ED immediately if new or worsening sx occur. We discussed the sx which are most concerning (e.g., sudden worsening pain, fever,  inability to tolerate by mouth) that necessitate immediate return. Medications administered to the patient during their visit and any new prescriptions provided to the patient are listed below.  Medications given during this visit Medications  morphine 4 MG/ML injection 4 mg (4 mg Intravenous Given 02/02/21 1732)  ondansetron (ZOFRAN) injection 4 mg (4 mg Intravenous Given 02/02/21 1731)  metroNIDAZOLE (FLAGYL) tablet 2,000 mg (2,000 mg Oral Given 02/02/21 1831)  doxycycline (VIBRA-TABS) tablet 100 mg (100 mg Oral Given 02/02/21 1832)  cefTRIAXone (ROCEPHIN) 1 g in sodium chloride 0.9 % 100 mL IVPB (0 g Intravenous Stopped 02/02/21 1912)     The patient appears reasonably screen and/or stabilized for discharge and I doubt any other medical condition or other [facility] requiring further screening, evaluation, or treatment in the ED at this time prior to discharge.     Final Clinical Impression(s) / ED Diagnoses Final diagnoses:  Generalized abdominal pain  Trichomoniasis    Rx / DC Orders ED Discharge Orders         Ordered    doxycycline (VIBRAMYCIN) 100 MG capsule  2 times daily        02/02/21 Verdi, Jacquetta Polhamus, DO 02/02/21 1915

## 2021-02-02 NOTE — ED Notes (Signed)
Pt is aware that she needs to give a urine sample, she states she does not need to use the restroom at this time.

## 2021-02-04 LAB — GC/CHLAMYDIA PROBE AMP (~~LOC~~) NOT AT ARMC
Chlamydia: NEGATIVE
Comment: NEGATIVE
Comment: NORMAL
Neisseria Gonorrhea: NEGATIVE

## 2021-08-18 ENCOUNTER — Emergency Department (HOSPITAL_BASED_OUTPATIENT_CLINIC_OR_DEPARTMENT_OTHER)
Admission: EM | Admit: 2021-08-18 | Discharge: 2021-08-18 | Disposition: A | Discharge status: Home / Self Care | Payer: 59 | Attending: Emergency Medicine | Admitting: Emergency Medicine

## 2021-08-18 ENCOUNTER — Encounter (HOSPITAL_BASED_OUTPATIENT_CLINIC_OR_DEPARTMENT_OTHER): Payer: Self-pay | Admitting: Urology

## 2021-08-18 ENCOUNTER — Emergency Department (HOSPITAL_BASED_OUTPATIENT_CLINIC_OR_DEPARTMENT_OTHER): Payer: 59

## 2021-08-18 ENCOUNTER — Other Ambulatory Visit: Payer: Self-pay

## 2021-08-18 DIAGNOSIS — N83202 Unspecified ovarian cyst, left side: Secondary | ICD-10-CM | POA: Insufficient documentation

## 2021-08-18 DIAGNOSIS — R52 Pain, unspecified: Secondary | ICD-10-CM

## 2021-08-18 DIAGNOSIS — R1032 Left lower quadrant pain: Secondary | ICD-10-CM | POA: Diagnosis present

## 2021-08-18 DIAGNOSIS — L723 Sebaceous cyst: Secondary | ICD-10-CM | POA: Diagnosis not present

## 2021-08-18 MED ORDER — LIDOCAINE HCL (PF) 1 % IJ SOLN
5.0000 mL | Freq: Once | INTRAMUSCULAR | Status: AC
  Administered 2021-08-18: 5 mL
  Filled 2021-08-18: qty 5

## 2021-08-18 MED ORDER — OXYCODONE-ACETAMINOPHEN 5-325 MG PO TABS
2.0000 | ORAL_TABLET | Freq: Once | ORAL | Status: AC
  Administered 2021-08-18: 2 via ORAL
  Filled 2021-08-18: qty 2

## 2021-08-18 MED ORDER — HYDROCODONE-ACETAMINOPHEN 5-325 MG PO TABS: 1.0000 | ORAL_TABLET | ORAL | 0 refills | Status: DC | PRN

## 2021-08-18 NOTE — Discharge Instructions (Addendum)
Schedule to see gynecology for evaluation of ovarian cyst

## 2021-08-18 NOTE — ED Triage Notes (Signed)
Generalized abdominal pain x 3 days, seen at [facility] for same, told to follow up with pcp.  Left ovarian cyst noted at previous visit.  Denies fever, denies N/V, denies diarrhea

## 2021-08-20 NOTE — ED Provider Notes (Signed)
[facility] EMERGENCY DEPARTMENT Provider Note   CSN: [id number] Arrival date & time: 08/18/21  1824     History Chief Complaint  Patient presents with   Abdominal Pain    Kristin Boyd is a 40 y.o. female.  Patient complains of lower abdominal pain she recently was diagnosed with an ovarian cyst and told to return to the emergency department if pain worsened.  Patient also complains of an abscess to her left axilla.  Patient reports she has had the abscessed area for 3 weeks.  Patient reports her gynecologist has been following this ovarian cyst.  The history is provided by the patient. No language interpreter was used.  Abdominal Pain Pain location:  LLQ Pain quality: aching   Pain radiates to:  Does not radiate Pain severity:  Moderate Onset quality:  Gradual Timing:  Intermittent Progression:  Worsening Chronicity:  New Relieved by:  Nothing Worsened by:  Nothing Ineffective treatments:  None tried Risk factors: no alcohol abuse       Past Medical History:  Diagnosis Date   Chronic knee pain    Chronic pelvic pain in female    Obese    Ovarian cyst    Pneumonia due to COVID-19 virus 05/26/2019    Patient Active Problem List   Diagnosis Date Noted   Morbid obesity with BMI of 50.0-59.9, adult ([place]) 06/06/2019    Past Surgical History:  Procedure Laterality Date   CESAREAN SECTION     CHOLECYSTECTOMY     ENDOMETRIAL ABLATION       OB History   No obstetric history on file.     History reviewed. No pertinent family history.  Social History   Tobacco Use   Smoking status: Former   Smokeless tobacco: Never  Scientific laboratory technician Use: Never used  Substance Use Topics   Alcohol use: Yes    Comment: occasional   Drug use: Never    Home Medications Prior to Admission medications   Medication Sig Start Date End Date Taking? Authorizing Provider  HYDROcodone-acetaminophen (NORCO/VICODIN) 5-325 MG tablet Take 1 tablet by mouth every 4  (four) hours as needed for moderate pain. 08/18/21 08/18/22 Yes Fransico Meadow, PA-C  doxycycline (VIBRAMYCIN) 100 MG capsule Take 1 capsule (100 mg total) by mouth 2 (two) times daily. One po bid x 7 days 02/02/21   Deno Etienne, DO  lidocaine (LIDODERM) 5 % Place 1 patch onto the skin daily. Remove & Discard patch within 12 hours or as directed by MD 03/14/20   Henderly, Britni A, PA-C  methocarbamol (ROBAXIN) 500 MG tablet Take 1 tablet (500 mg total) by mouth 2 (two) times daily. 03/14/20   Henderly, Britni A, PA-C  vitamin C (VITAMIN C) 500 MG tablet Take 1 tablet (500 mg total) by mouth daily. Please take for 10 days 05/31/19   Elgergawy, Silver Huguenin, MD  zinc sulfate 220 (50 Zn) MG capsule Take 1 capsule (220 mg total) by mouth daily. Take for 10 days 05/31/19   Elgergawy, Silver Huguenin, MD    Allergies    Peanut (diagnostic) and Penicillins  Review of Systems   Review of Systems  Gastrointestinal:  Positive for abdominal pain.  All other systems reviewed and are negative.  Physical Exam Updated Vital Signs BP (!) 154/100 (BP Location: Right Arm)   Pulse 78   Temp 99.1 F (37.3 C) (Oral)   Resp 18   Ht '5\' 9"'$  (1.753 m)   Wt (!) 169.2 kg  SpO2 99%   BMI 55.08 kg/m   Physical Exam Vitals and nursing note reviewed.  Constitutional:      Appearance: She is well-developed.  HENT:     Head: Normocephalic.  Pulmonary:     Effort: Pulmonary effort is normal.  Abdominal:     General: Abdomen is flat. There is no distension.     Tenderness: There is abdominal tenderness in the left lower quadrant. There is no guarding.     Hernia: No hernia is present.  Musculoskeletal:        General: Normal range of motion.     Cervical back: Normal range of motion.  Skin:    General: Skin is warm.     Comments: 2.5 cm swollen area left axilla  Neurological:     Mental Status: She is alert and oriented to person, place, and time.    ED Results / Procedures / Treatments   Labs (all labs ordered are  listed, but only abnormal results are displayed) Labs Reviewed - No data to display  EKG None  Radiology US PELVIC COMPLETE W TRANSVAGINAL AND TORSION R/O  Result Date: 08/18/2021 CLINICAL DATA:  Generalized pelvic pain. EXAM: TRANSABDOMINAL AND TRANSVAGINAL ULTRASOUND OF PELVIS DOPPLER ULTRASOUND OF OVARIES TECHNIQUE: Both transabdominal and transvaginal ultrasound examinations of the pelvis were performed. Transabdominal technique was performed for global imaging of the pelvis including uterus, ovaries, adnexal regions, and pelvic cul-de-sac. It was necessary to proceed with endovaginal exam following the transabdominal exam to visualize the endometrium and ovaries. Color and duplex Doppler ultrasound was utilized to evaluate blood flow to the ovaries. COMPARISON:  CT abdomen pelvis dated 08/16/2021. FINDINGS: Uterus Measurements: 11.6 x 6.2 x 6.8 cm = volume: 257 mL. The uterus is anteverted. There is heterogeneous appearance of the myometrium which may represent adenomyosis. Ill-defined 1.7 x 2.1 x 1.8 cm heterogeneous hypoechoic area in the anterior uterus may represent a fibroid or a focal area of adenomyosis. Endometrium Thickness: 10 mm. The visualized portions of the endometrium appear unremarkable. Right ovary Measurements: 4.0 x 2.8 x 2.8 cm = volume: 16 mL. There is a 1.8 x 1.8 x 2.0 cm anechoic structure in the right ovary, likely a follicle or cyst. Arterial and venous waveforms noted. Left ovary The left ovarian parenchyma is poorly visualized. There is a 9.7 x 8.2 x 8.2 cm hypoechoic structure with through transmission and no significant internal vascularity in the region of the left adnexa. This may represent a complex/hemorrhagic or proteinaceous cyst with possible 20 or infectious process, an endometrioma, or less likely a dermoid. Clinical correlation recommended. If there is clinical evidence of inflammatory/infectious process follow-up with ultrasound after treatment and resolution of  acute symptoms recommended. If there is clinical concern for a neoplasm, gynecology consult and further characterization with MRI without and with contrast on a nonemergent/outpatient basis is advised. Arterial and venous waveforms noted in the periphery of this lesion, likely in the ovarian parenchyma. Other findings Small free fluid in the pelvis. IMPRESSION: 1. Heterogeneous uterus with possible adenomyosis. 2. Indeterminate hypoechoic left ovarian lesion as above. Electronically Signed   By: Anner Crete M.D.   On: 08/18/2021 20:56    Procedures .Marland KitchenIncision and Drainage  Date/Time: 08/20/2021 5:57 PM Performed by: Fransico Meadow, PA-C Authorized by: Fransico Meadow, PA-C   Consent:    Consent obtained:  Verbal   Consent given by:  Patient   Risks, benefits, and alternatives were discussed: yes     Risks discussed:  Bleeding  and incomplete drainage Universal protocol:    Procedure explained and questions answered to patient or proxy's satisfaction: yes     Patient identity confirmed:  Verbally with patient Location:    Type:  Cyst   Size:  2.5   Location:  Upper extremity   Upper extremity location:  Arm   Arm location:  L upper arm Sedation:    Sedation type:  None Anesthesia:    Anesthesia method:  Topical application Procedure type:    Complexity:  Simple Procedure details:    Ultrasound guidance: no     Incision types:  Single straight   Wound treatment:  Wound left open Post-procedure details:    Procedure completion:  Tolerated Comments:     2 cm intact sebaceous cyst removed from left axilla.   Medications Ordered in ED Medications  oxyCODONE-acetaminophen (PERCOCET/ROXICET) 5-325 MG per tablet 2 tablet (2 tablets Oral Given 08/18/21 1937)  lidocaine (PF) (XYLOCAINE) 1 % injection 5 mL (5 mLs Other Given 08/18/21 2213)    ED Course  I have reviewed the triage vital signs and the nursing notes.  Pertinent labs & imaging results that were available during my  care of the patient were reviewed by me and considered in my medical decision making (see chart for details).    MDM Rules/Calculators/A&P                           MDM: Ultrasound shows no evidence of ovarian torsion left ovarian mass is seen radiology recommended MRI/GYN follow-up.  Patient is advised to call her GYN to be seen for further evaluation.  Patient is counseled on wound care for I&D. Final Clinical Impression(s) / ED Diagnoses Final diagnoses:  Pain  Cyst of left ovary  Sebaceous cyst of left axilla    Rx / DC Orders ED Discharge Orders          Ordered    HYDROcodone-acetaminophen (NORCO/VICODIN) 5-325 MG tablet  Every 4 hours PRN        08/18/21 2144          An After Visit Summary was printed and given to the patient.    Fransico Meadow, PA-C 08/20/21 1800    Gareth Morgan, MD 08/21/21 [id number]

## 2021-10-12 ENCOUNTER — Other Ambulatory Visit: Payer: Self-pay

## 2021-10-12 ENCOUNTER — Emergency Department (HOSPITAL_BASED_OUTPATIENT_CLINIC_OR_DEPARTMENT_OTHER): Payer: 59

## 2021-10-12 ENCOUNTER — Encounter (HOSPITAL_BASED_OUTPATIENT_CLINIC_OR_DEPARTMENT_OTHER): Payer: Self-pay | Admitting: Emergency Medicine

## 2021-10-12 ENCOUNTER — Emergency Department (HOSPITAL_BASED_OUTPATIENT_CLINIC_OR_DEPARTMENT_OTHER)
Admission: EM | Admit: 2021-10-12 | Discharge: 2021-10-12 | Disposition: A | Discharge status: Home / Self Care | Payer: 59 | Attending: Emergency Medicine | Admitting: Emergency Medicine

## 2021-10-12 DIAGNOSIS — N838 Other noninflammatory disorders of ovary, fallopian tube and broad ligament: Secondary | ICD-10-CM | POA: Diagnosis not present

## 2021-10-12 DIAGNOSIS — Z87891 Personal history of nicotine dependence: Secondary | ICD-10-CM | POA: Diagnosis not present

## 2021-10-12 DIAGNOSIS — Z8616 Personal history of COVID-19: Secondary | ICD-10-CM | POA: Diagnosis not present

## 2021-10-12 DIAGNOSIS — R102 Pelvic and perineal pain: Secondary | ICD-10-CM

## 2021-10-12 DIAGNOSIS — R1032 Left lower quadrant pain: Secondary | ICD-10-CM | POA: Diagnosis present

## 2021-10-12 LAB — CBC WITH DIFFERENTIAL/PLATELET
Abs Immature Granulocytes: 0.03 10*3/uL (ref 0.00–0.07)
Basophils Absolute: 0.1 10*3/uL (ref 0.0–0.1)
Basophils Relative: 1 %
Eosinophils Absolute: 0.1 10*3/uL (ref 0.0–0.5)
Eosinophils Relative: 1 %
HCT: 40.5 % (ref 36.0–46.0)
Hemoglobin: 13.1 g/dL (ref 12.0–15.0)
Immature Granulocytes: 0 %
Lymphocytes Relative: 22 %
Lymphs Abs: 2.5 10*3/uL (ref 0.7–4.0)
MCH: 25.9 pg — ABNORMAL LOW (ref 26.0–34.0)
MCHC: 32.3 g/dL (ref 30.0–36.0)
MCV: 80.2 fL (ref 80.0–100.0)
Monocytes Absolute: 0.8 10*3/uL (ref 0.1–1.0)
Monocytes Relative: 7 %
Neutro Abs: 8.2 10*3/uL — ABNORMAL HIGH (ref 1.7–7.7)
Neutrophils Relative %: 69 %
Platelets: 324 10*3/uL (ref 150–400)
RBC: 5.05 MIL/uL (ref 3.87–5.11)
RDW: 15.8 % — ABNORMAL HIGH (ref 11.5–15.5)
WBC: 11.7 10*3/uL — ABNORMAL HIGH (ref 4.0–10.5)
nRBC: 0 % (ref 0.0–0.2)

## 2021-10-12 LAB — BASIC METABOLIC PANEL
Anion gap: 9 (ref 5–15)
BUN: 13 mg/dL (ref 6–20)
CO2: 21 mmol/L — ABNORMAL LOW (ref 22–32)
Calcium: 8.8 mg/dL — ABNORMAL LOW (ref 8.9–10.3)
Chloride: 107 mmol/L (ref 98–111)
Creatinine, Ser: 0.74 mg/dL (ref 0.44–1.00)
GFR, Estimated: >60 mL/min (ref >60)
Glucose, Bld: 97 mg/dL (ref 70–99)
Potassium: 3.9 mmol/L (ref 3.5–5.1)
Sodium: 137 mmol/L (ref 135–145)

## 2021-10-12 LAB — HCG, SERUM, QUALITATIVE: Preg, Serum: NEGATIVE

## 2021-10-12 MED ORDER — FENTANYL CITRATE PF 50 MCG/ML IJ SOSY
50.0000 ug | PREFILLED_SYRINGE | Freq: Once | INTRAMUSCULAR | Status: AC
  Administered 2021-10-12: 50 ug via INTRAVENOUS
  Filled 2021-10-12: qty 1

## 2021-10-12 MED ORDER — KETOROLAC TROMETHAMINE 30 MG/ML IJ SOLN
30.0000 mg | Freq: Once | INTRAMUSCULAR | Status: AC
  Administered 2021-10-12: 30 mg via INTRAVENOUS
  Filled 2021-10-12: qty 1

## 2021-10-12 MED ORDER — FENTANYL CITRATE PF 50 MCG/ML IJ SOSY
50.0000 ug | PREFILLED_SYRINGE | Freq: Once | INTRAMUSCULAR | Status: AC
Start: 2021-10-12 — End: 2021-10-12
  Administered 2021-10-12: 50 ug via INTRAVENOUS
  Filled 2021-10-12: qty 1

## 2021-10-12 MED ORDER — SODIUM CHLORIDE 0.9 % IV SOLN: INTRAVENOUS | Status: DC

## 2021-10-12 MED ORDER — ONDANSETRON HCL 4 MG/2ML IJ SOLN
4.0000 mg | Freq: Once | INTRAMUSCULAR | Status: AC
  Administered 2021-10-12: 4 mg via INTRAVENOUS
  Filled 2021-10-12: qty 2

## 2021-10-12 MED ORDER — KETOROLAC TROMETHAMINE 60 MG/2ML IM SOLN: 60.0000 mg | Freq: Once | INTRAMUSCULAR | Status: DC

## 2021-10-12 NOTE — ED Notes (Signed)
Patient attempted to give a urine specimen and was unsuccessful

## 2021-10-12 NOTE — ED Triage Notes (Signed)
Pt is c/o severe abd pain that started a couple hours ago  Pt states she took some tylenol at home with out relief  Pt denies N/V/D  Pt states she has an ovarian cyst on the left side

## 2021-10-12 NOTE — ED Notes (Signed)
ED Provider at bedside. 

## 2021-10-12 NOTE — Discharge Instructions (Signed)
Please follow-up with your gynecologist.  Return to ER as needed for worsening abdominal pain, vomiting or other new concerning symptom.

## 2021-10-12 NOTE — ED Provider Notes (Signed)
40 year old lady with history of left ovarian mass presenting to ER with concern for ongoing abdominal, pelvic pain.  Basic labs, urinalysis, CT scan ordered to further assess.  Anticipate likely discharge after pain control and work-up.  CT scan demonstrating large heterogeneous left adnexal mass increased in size since September.  Haziness of the surrounding fat.  Radiologist recommending gyn consultation and pelvic ultrasound.  Will check ultrasound to rule out ovarian torsion.  Patient was aware and is being managed closely by gynecology and oncology within the [facility] system.  TVUS negative for torsion.  Reassessed patient, pain is currently well controlled.  Recommend that she follow-up with her GYN ONC team on Monday. Dc home.   After the discussed management above, the patient was determined to be safe for discharge.  The patient was in agreement with this plan and all questions regarding their care were answered.  ED return precautions were discussed and the patient will return to the ED with any significant worsening of condition.     Lucrezia Starch, MD 10/12/21 1227

## 2021-10-12 NOTE — ED Notes (Signed)
Pt in US

## 2021-10-12 NOTE — ED Provider Notes (Signed)
[facility] DEPARTMENT Provider Note   CSN: [id number] Arrival date & time: 10/12/21  0542     History Chief Complaint  Patient presents with   Abdominal Pain    Kristin Boyd is a 40 y.o. female.  The history is provided by the patient.  Abdominal Pain Pain location:  LLQ and suprapubic Pain quality: sharp   Pain radiates to:  Does not radiate Pain severity:  Severe Onset quality:  Sudden Duration:  2 hours Timing:  Constant Progression:  Unchanged Chronicity:  Recurrent (acute on chronic, has been going on since 2020) Context: recent illness   Context: not trauma   Context comment:  Diagnosis of L ovarian mass Relieved by:  Nothing Worsened by:  Nothing Ineffective treatments:  None tried (is awaiting pick up of her percocet prescription from the pharmacy) Associated symptoms: no diarrhea, no dysuria, no fever and no vomiting   Risk factors: not pregnant   Patient with a h/o of chronic pelvic pain and L ovarian mass recently diagnosed who currently seeing GYN oncology presents with pelvic pain.  Severe in nature.  No vomiting or diarrhea.  States this pain has been ongoing since 2020 but worsens at times.  No f/c/r.      Past Medical History:  Diagnosis Date   Chronic knee pain    Chronic pelvic pain in female    Obese    Ovarian cyst    Pneumonia due to COVID-19 virus 05/26/2019    Patient Active Problem List   Diagnosis Date Noted   Morbid obesity with BMI of 50.0-59.9, adult ([place]) 06/06/2019    Past Surgical History:  Procedure Laterality Date   CESAREAN SECTION     CHOLECYSTECTOMY     ENDOMETRIAL ABLATION       OB History   No obstetric history on file.     History reviewed. No pertinent family history.  Social History   Tobacco Use   Smoking status: Former   Smokeless tobacco: Never  Scientific laboratory technician Use: Never used  Substance Use Topics   Alcohol use: Yes    Comment: occasional   Drug use: Never    Home  Medications Prior to Admission medications   Medication Sig Start Date End Date Taking? Authorizing Provider  doxycycline (VIBRAMYCIN) 100 MG capsule Take 1 capsule (100 mg total) by mouth 2 (two) times daily. One po bid x 7 days 02/02/21   Deno Etienne, DO  HYDROcodone-acetaminophen (NORCO/VICODIN) 5-325 MG tablet Take 1 tablet by mouth every 4 (four) hours as needed for moderate pain. 08/18/21 08/18/22  Fransico Meadow, PA-C  lidocaine (LIDODERM) 5 % Place 1 patch onto the skin daily. Remove & Discard patch within 12 hours or as directed by MD 03/14/20   Henderly, Britni A, PA-C  methocarbamol (ROBAXIN) 500 MG tablet Take 1 tablet (500 mg total) by mouth 2 (two) times daily. 03/14/20   Henderly, Britni A, PA-C  vitamin C (VITAMIN C) 500 MG tablet Take 1 tablet (500 mg total) by mouth daily. Please take for 10 days 05/31/19   Elgergawy, Silver Huguenin, MD  zinc sulfate 220 (50 Zn) MG capsule Take 1 capsule (220 mg total) by mouth daily. Take for 10 days 05/31/19   Elgergawy, Silver Huguenin, MD    Allergies    Peanut (diagnostic) and Penicillins  Review of Systems   Review of Systems  Constitutional:  Negative for fever.  HENT:  Negative for facial swelling.   Eyes:  Negative  for redness.  Respiratory:  Negative for wheezing and stridor.   Cardiovascular:  Negative for leg swelling.  Gastrointestinal:  Positive for abdominal pain. Negative for diarrhea and vomiting.  Genitourinary:  Negative for dysuria.  Musculoskeletal:  Negative for neck stiffness.  Skin:  Negative for rash.  Neurological:  Negative for facial asymmetry.  Psychiatric/Behavioral:  Negative for agitation.   All other systems reviewed and are negative.  Physical Exam Updated Vital Signs BP (!) 238/136 (BP Location: Right Arm)   Pulse 88   Temp 99 F (37.2 C) (Oral)   Resp 20   Ht 5\' 9"  (1.753 m)   Wt (!) 157.9 kg   SpO2 100%   BMI 51.39 kg/m   Physical Exam Vitals and nursing note reviewed.  Constitutional:      Appearance:  Normal appearance. She is not diaphoretic.  HENT:     Head: Normocephalic and atraumatic.     Nose: Nose normal.  Eyes:     Conjunctiva/sclera: Conjunctivae normal.     Pupils: Pupils are equal, round, and reactive to light.  Cardiovascular:     Rate and Rhythm: Normal rate and regular rhythm.     Pulses: Normal pulses.     Heart sounds: Normal heart sounds.  Pulmonary:     Effort: Pulmonary effort is normal.     Breath sounds: Normal breath sounds.  Abdominal:     General: Abdomen is flat. Bowel sounds are normal.     Palpations: Abdomen is soft.     Tenderness: There is no abdominal tenderness. There is no guarding.  Musculoskeletal:        General: Normal range of motion.     Cervical back: Normal range of motion and neck supple.     Right lower leg: No edema.     Left lower leg: No edema.  Skin:    General: Skin is warm and dry.     Capillary Refill: Capillary refill takes less than 2 seconds.  Neurological:     General: No focal deficit present.     Mental Status: She is alert and oriented to person, place, and time.     Deep Tendon Reflexes: Reflexes normal.  Psychiatric:        Mood and Affect: Mood normal.        Behavior: Behavior normal.    ED Results / Procedures / Treatments   Labs (all labs ordered are listed, but only abnormal results are displayed) Labs Reviewed  URINALYSIS, ROUTINE W REFLEX MICROSCOPIC  CBC WITH DIFFERENTIAL/PLATELET  BASIC METABOLIC PANEL  HCG, SERUM, QUALITATIVE    EKG None  Radiology No results found.  Procedures Procedures   Medications Ordered in ED Medications  fentaNYL (SUBLIMAZE) injection 50 mcg (has no administration in time range)  ketorolac (TORADOL) 30 MG/ML injection 30 mg (has no administration in time range)  0.9 %  sodium chloride infusion (has no administration in time range)  ondansetron (ZOFRAN) injection 4 mg (has no administration in time range)    ED Course  I have reviewed the triage vital signs  and the nursing notes.  Pertinent labs & imaging results that were available during my care of the patient were reviewed by me and considered in my medical decision making (see chart for details).  Final Clinical Impression(s) / ED Diagnoses Final diagnoses:  Ovarian mass   Signed out to Dr. Roslynn Amble pending labs and imaging    Rosaly Labarbera, MD 10/12/21 [phone]

## 2021-10-15 ENCOUNTER — Telehealth: Payer: Self-pay | Admitting: Oncology

## 2021-10-15 NOTE — Telephone Encounter (Signed)
Received call from Marty, [place] at [facility].  Patient has seen Dr. Alvester Chou and her insurance is not in network.  They are wondering if she can be seen at [facility] by Dr. Berline Lopes.  Advised that we will work on getting the patient scheduled.

## 2021-10-17 ENCOUNTER — Telehealth: Payer: Self-pay | Admitting: *Deleted

## 2021-10-17 NOTE — Telephone Encounter (Signed)
Spoke with the patient and scheduled a new patient appt with Dr Berline Lopes on 11/21 at 10:30 am; patient given an arrival time of 10 am. Patient given the address and phone number for the clinic; along with the policy for mask and visitors.

## 2021-10-21 ENCOUNTER — Encounter: Payer: Self-pay | Admitting: Gynecologic Oncology

## 2021-10-23 ENCOUNTER — Inpatient Hospital Stay: Payer: 59

## 2021-10-23 ENCOUNTER — Other Ambulatory Visit: Payer: Self-pay

## 2021-10-23 ENCOUNTER — Inpatient Hospital Stay: Payer: 59 | Attending: Gynecologic Oncology | Admitting: Gynecologic Oncology

## 2021-10-23 ENCOUNTER — Inpatient Hospital Stay (HOSPITAL_BASED_OUTPATIENT_CLINIC_OR_DEPARTMENT_OTHER): Payer: 59 | Admitting: Gynecologic Oncology

## 2021-10-23 ENCOUNTER — Encounter: Payer: Self-pay | Admitting: Gynecologic Oncology

## 2021-10-23 VITALS — BP 155/85 | HR 79 | Temp 97.3°F | Resp 17 | Wt 350.2 lb

## 2021-10-23 DIAGNOSIS — D72829 Elevated white blood cell count, unspecified: Secondary | ICD-10-CM | POA: Diagnosis not present

## 2021-10-23 DIAGNOSIS — D398 Neoplasm of uncertain behavior of other specified female genital organs: Secondary | ICD-10-CM

## 2021-10-23 DIAGNOSIS — N9489 Other specified conditions associated with female genital organs and menstrual cycle: Secondary | ICD-10-CM

## 2021-10-23 DIAGNOSIS — R102 Pelvic and perineal pain: Secondary | ICD-10-CM | POA: Diagnosis not present

## 2021-10-23 DIAGNOSIS — Z6841 Body Mass Index (BMI) 40.0 and over, adult: Secondary | ICD-10-CM

## 2021-10-23 LAB — CBC (CANCER CENTER ONLY)
HCT: 40.3 % (ref 36.0–46.0)
Hemoglobin: 12.9 g/dL (ref 12.0–15.0)
MCH: 26.1 pg (ref 26.0–34.0)
MCHC: 32 g/dL (ref 30.0–36.0)
MCV: 81.4 fL (ref 80.0–100.0)
Platelet Count: 330 10*3/uL (ref 150–400)
RBC: 4.95 MIL/uL (ref 3.87–5.11)
RDW: 15.3 % (ref 11.5–15.5)
WBC Count: 9.4 10*3/uL (ref 4.0–10.5)
nRBC: 0 % (ref 0.0–0.2)

## 2021-10-23 MED ORDER — IBUPROFEN 600 MG PO TABS: 600.0000 mg | ORAL_TABLET | Freq: Four times a day (QID) | ORAL | 0 refills | Status: DC | PRN

## 2021-10-23 MED ORDER — OXYCODONE HCL 5 MG PO TABS: 5.0000 mg | ORAL_TABLET | Freq: Four times a day (QID) | ORAL | 0 refills | Status: DC | PRN

## 2021-10-23 MED ORDER — SENNOSIDES-DOCUSATE SODIUM 8.6-50 MG PO TABS: 2.0000 | ORAL_TABLET | Freq: Every day | ORAL | 0 refills | Status: DC

## 2021-10-23 NOTE — Progress Notes (Signed)
GYNECOLOGIC ONCOLOGY NEW PATIENT CONSULTATION   Patient Name: Kristin Boyd  Patient Age: 40 y.o. Date of Service: 10/23/2021 Referring Provider: Franklyn Lor, MD  Primary Care Provider: Patient, No Pcp Per (Inactive) Consulting Provider: Jeral Pinch, MD   Assessment/Plan:  Premenopausal patient with complex adnexal cyst.  Discussed multiple imaging studies that the patient has had in the last two years, which demonstrate minimal if any change in size of her adnexal mass. There are some findings concerning for some inflammation, and there has been question before based on imaging findings if this may represent a tubo-ovarian abscess.   The patient is quite symptomatic, and continues to be more so with time. I think for this reason, as well as the persistence of her mass, that surgical intervention is reasonable. When she was most recently seen in the emergency department, she had a mild leukocytosis. We discussed today that I'd like to repeat this. While she is tender, she does not have CMT or other findings consistent with pelvic inflammatory disease. That being said, given the appearance of this mass on imaging, if white count is still elevated, then I would favor a course of antibiotics prior to surgery.  Patient has not had any vaginal bleeding since her ablation. Her lining looks somewhat thickened on ultrasound. We discussed possibility of endometrial biopsy or sampling at the time of surgery.  With regard to surgery, I recommend that we proceed with robotic unilateral oophorectomy and bilateral salpingectomy. She has finished with childbearing. I discuss the benefits of retaining one ovary if normal in appearance to allow her to go through menopause naturally. Given her obesity, she is at increased surgical risk and I would not recommend additional procedures unless medically indicated. Plan will be for removal of the adnexal mass. It will be placed in a contained bag for drainage  and decompression. Mass will then be sent to pathology for frozen section at the time of surgery. If no malignancy or borderline to identified, no additional procedures would be performed. If borderline tumor identified, I recommend that we leave one over in situ and perform total hysterectomy. Given the risk of recurrence, the patient would ultimately benefit from completion surgery with removal of her remaining ovary but ideally in at least several years. If malignancy identified, then we discussed possible staging procedures including lymph node removal and omentectomy.  We discussed plan for a robotic assisted unilateral oophorectomy, bilateral salpingectomy, possible total hysterectomy, possible staging, D&C. The risks of surgery were discussed in detail and she understands these to include infection; wound separation; hernia; vaginal cuff separation, injury to adjacent organs such as bowel, bladder, blood vessels, ureters and nerves; bleeding which may require blood transfusion; anesthesia risk; thromboembolic events; possible death; unforeseen complications; possible need for re-exploration; medical complications such as heart attack, stroke, pleural effusion and pneumonia; and, if full lymphadenectomy is performed the risk of lymphedema and lymphocyst. The patient will receive DVT and antibiotic prophylaxis as indicated. She voiced a clear understanding. She had the opportunity to ask questions. Perioperative instructions were reviewed with her. Prescriptions for post-op medications were sent to her pharmacy of choice.  A copy of this note was sent to the patient's referring provider.   85 minutes of total time was spent for this patient encounter, including preparation, face-to-face counseling with the patient and coordination of care, and documentation of the encounter.   Jeral Pinch, MD  Division of Gynecologic Oncology  Department of Obstetrics and Gynecology  [facility]  ___________________________________________  Chief Complaint: Chief Complaint  Patient presents with   Adnexal mass    History of Present Illness:  Kristin Boyd is a 41 y.o. y.o. female who is seen in consultation at the request of Dr. Gwenlyn Found for an evaluation of adnexal mass.  Patient's history is notable for an endometrial ablation at the age of 39.  She describes her periods as being quite heavy at this time and occurring sometimes multiple times a month.  She then had intermittent pain after the ablation but then in 2019 started to have progressive pain.  She ultimately underwent imaging with CT scan and pelvic ultrasound in August 2020 which showed an 8.3 x 6.6 x 8.9 cm complex cystic and solid left adnexal mass.  Follow-up ultrasound showed a complex and cystic lesion in the left adnexa with a tubular shape measuring 12.6 x 5.6 x 7.4 cm; mass on imaging was felt to perhaps represent a tubo-ovarian abscess.  CA125 was obtained at that time and was mildly elevated at 51.  The plan at that time was for referral to GYN oncology, which did not appear to have happened.    She then had multiple visits in the emergency department with persistence of the mass noted.  Most recently, she had a consultation with Dr. Gwenlyn Found at [facility] with a plan to proceed with surgical excision.  Most recent imaging by ultrasound in mid October showed a left adnexal mass measuring 10.3 x 11.4 x 11.2 cm with associated septation and vascularity although poorly visualized.  Patient was seen in our emergency department on the fifth of this month.  She underwent CT and pelvic ultrasound which show persistent mass without significant change in size since most recent ultrasound.    Today, patient describes her pain history as being intermittent at first and now more constant.  She has had multiple visits to multiple emergency departments for this pain since 2019.  Over time, the pain has  worsened.  It typically starts in the mid left side of her abdomen and can radiate to her hips and back.  The pain will sometimes start in other locations.  She has a hard time getting comfortable and sleeping.  She has been using oxycodone as well as ibuprofen with some relief.  She uses the oxycodone mostly at night although took her last dose this morning.  She denies any fevers or chills.  She notes that appetite has been up and down, denies any nausea, or emesis.  She has occasional bloating and early satiety.  She notes pelvic pressure when she is not having pain in her abdomen.  She reports regular bowel function, denies any bladder symptoms.  Patient lives in [place] with her 12 year old child as well as her god sister and godson.  She is not currently working.  PAST MEDICAL HISTORY:  Past Medical History:  Diagnosis Date   Chronic knee pain    Chronic pelvic pain in female    Obese    Ovarian cyst    Pneumonia due to COVID-19 virus 05/26/2019     PAST SURGICAL HISTORY:  Past Surgical History:  Procedure Laterality Date   CESAREAN SECTION     CHOLECYSTECTOMY     ENDOMETRIAL ABLATION      OB/GYN HISTORY:  OB History  Gravida Para Term Preterm AB Living  4            SAB IAB Ectopic Multiple Live Births               #  Outcome Date GA Lbr Len/2nd Weight Sex Delivery Anes PTL Lv  4 Gravida           3 Gravida           2 Gravida           1 Gravida             No LMP recorded. Patient has had an ablation.  Age at menarche: 7 Age at menopause: Not applicable, patient denies any postmenopausal symptoms Hx of HRT: No Hx of STDs: Yes, patient reports a history of trichomonas approximately 3 years ago and being treated for both chlamydia and gonorrhea more than 10 years ago Last pap: 10/11/2021, negative for intraepithelial lesion or malignancy History of abnormal pap smears: Yes, patient reports prior abnormal Pap smears, has never had any biopsies or  procedures  SCREENING STUDIES:  Last mammogram: Years ago  Last colonoscopy: Has never had  MEDICATIONS: Outpatient Encounter Medications as of 10/23/2021  Medication Sig   ibuprofen (ADVIL) 600 MG tablet Take 1 tablet (600 mg total) by mouth every 6 (six) hours as needed for moderate pain. For AFTER surgery only   oxyCODONE (OXY IR/ROXICODONE) 5 MG immediate release tablet Take 1 tablet (5 mg total) by mouth every 6 (six) hours as needed for severe pain. To be used for before surgery for acute abdominal pain, do not take and drive   senna-docusate (SENOKOT-S) 8.6-50 MG tablet Take 2 tablets by mouth at bedtime. For AFTER surgery, do not take if having diarrhea   [DISCONTINUED] ibuprofen (ADVIL) 600 MG tablet Take 600 mg by mouth every 6 (six) hours as needed.   [DISCONTINUED] oxyCODONE (OXY IR/ROXICODONE) 5 MG immediate release tablet Take by mouth.   doxycycline (VIBRAMYCIN) 100 MG capsule Take 1 capsule (100 mg total) by mouth 2 (two) times daily. One po bid x 7 days (Patient not taking: Reported on 10/21/2021)   lidocaine (LIDODERM) 5 % Place 1 patch onto the skin daily. Remove & Discard patch within 12 hours or as directed by MD (Patient not taking: Reported on 10/21/2021)   methocarbamol (ROBAXIN) 500 MG tablet Take 1 tablet (500 mg total) by mouth 2 (two) times daily. (Patient not taking: Reported on 10/21/2021)   vitamin C (VITAMIN C) 500 MG tablet Take 1 tablet (500 mg total) by mouth daily. Please take for 10 days (Patient not taking: Reported on 10/21/2021)   zinc sulfate 220 (50 Zn) MG capsule Take 1 capsule (220 mg total) by mouth daily. Take for 10 days (Patient not taking: Reported on 10/21/2021)   [DISCONTINUED] HYDROcodone-acetaminophen (NORCO/VICODIN) 5-325 MG tablet Take 1 tablet by mouth every 4 (four) hours as needed for moderate pain. (Patient not taking: Reported on 10/21/2021)   No facility-administered encounter medications on file as of 10/23/2021.    ALLERGIES:   Allergies  Allergen Reactions   Peanut (Diagnostic) Anaphylaxis   Penicillins Anaphylaxis    Has patient had a PCN reaction causing immediate rash, facial/tongue/throat swelling, SOB or lightheadedness with hypotension: Yes Has patient had a PCN reaction causing severe rash involving mucus membranes or skin necrosis: Unknown Has patient had a PCN reaction that required hospitalization: Yes Has patient had a PCN reaction occurring within the last 10 years: Yes If all of the above answers are "NO", then may proceed with Cephalosporin use.     FAMILY HISTORY:  Family History  Problem Relation Age of Onset   Colon cancer Neg Hx    Breast cancer Neg Hx  Ovarian cancer Neg Hx    Endometrial cancer Neg Hx    Pancreatic cancer Neg Hx    Prostate cancer Neg Hx      SOCIAL HISTORY:  Social Connections: Not on file    REVIEW OF SYSTEMS:  Pertinent positives include abdominal pain Denies appetite changes, fevers, chills, fatigue, unexplained weight changes. Denies hearing loss, neck lumps or masses, mouth sores, ringing in ears or voice changes. Denies cough or wheezing.  Denies shortness of breath. Denies chest pain or palpitations. Denies leg swelling. Denies abdominal distention, blood in stools, constipation, diarrhea, nausea, vomiting, or early satiety. Denies pain with intercourse, dysuria, frequency, hematuria or incontinence. Denies hot flashes, pelvic pain, vaginal bleeding or vaginal discharge.   Denies joint pain, back pain or muscle pain/cramps. Denies itching, rash, or wounds. Denies dizziness, headaches, numbness or seizures. Denies swollen lymph nodes or glands, denies easy bruising or bleeding. Denies anxiety, depression, confusion, or decreased concentration.  Physical Exam:  Vital Signs for this encounter:  Blood pressure (!) 155/85, pulse 79, temperature (!) 97.3 F (36.3 C), temperature source Tympanic, resp. rate 17, weight (!) 350 lb 4 oz (158.9 kg), SpO2 94  %. Body mass index is 51.72 kg/m. General: Alert, oriented, no acute distress.  HEENT: Normocephalic, atraumatic. Sclera anicteric.  Chest: Clear to auscultation bilaterally. No wheezes, rhonchi, or rales. Cardiovascular: Regular rate and rhythm, no murmurs, rubs, or gallops.  Abdomen: Obese. Normoactive bowel sounds. Soft, nondistended, nontender to palpation. No masses or hepatosplenomegaly appreciated. No palpable fluid wave.  Extremities: Grossly normal range of motion. Warm, well perfused. No edema bilaterally.  Skin: No rashes or lesions.  Lymphatics: No cervical, supraclavicular, or inguinal adenopathy.  GU:  Normal external female genitalia. No lesions. No discharge or bleeding.             Bladder/urethra:  No lesions or masses, well supported bladder             Vagina: Well rugated, no lesions or masses.             Cervix: Normal appearing, no lesions.  No cervical motion tenderness on bimanual exam.             Uterus: Small, mobile, no parametrial involvement or nodularity.             Adnexa: Exam limited by patient's body habitus but there is a mass that sits out of the pelvis that moves moderately with the uterus, unable to appreciate whether this is smooth.  Rectal: Unable to palpate the mass.  LABORATORY AND RADIOLOGIC DATA:  Outside medical records were reviewed to synthesize the above history, along with the history and physical obtained during the visit.   Lab Results  Component Value Date   WBC 9.4 10/23/2021   HGB 12.9 10/23/2021   HCT 40.3 10/23/2021   PLT 330 10/23/2021   GLUCOSE 97 10/12/2021   TRIG 62 05/25/2019   ALT 21 02/02/2021   AST 20 02/02/2021   NA 137 10/12/2021   K 3.9 10/12/2021   CL 107 10/12/2021   CREATININE 0.74 10/12/2021   BUN 13 10/12/2021   CO2 21 (L) 10/12/2021

## 2021-10-23 NOTE — H&P (View-Only) (Signed)
GYNECOLOGIC ONCOLOGY NEW PATIENT CONSULTATION   Patient Name: Kristin Boyd  Patient Age: 40 y.o. Date of Service: 10/23/2021 Referring Provider: Franklyn Lor, MD  Primary Care Provider: Patient, No Pcp Per (Inactive) Consulting Provider: Jeral Pinch, MD   Assessment/Plan:  Premenopausal patient with complex adnexal cyst.  Discussed multiple imaging studies that the patient has had in the last two years, which demonstrate minimal if any change in size of her adnexal mass. There are some findings concerning for some inflammation, and there has been question before based on imaging findings if this may represent a tubo-ovarian abscess.   The patient is quite symptomatic, and continues to be more so with time. I think for this reason, as well as the persistence of her mass, that surgical intervention is reasonable. When she was most recently seen in the emergency department, she had a mild leukocytosis. We discussed today that I'd like to repeat this. While she is tender, she does not have CMT or other findings consistent with pelvic inflammatory disease. That being said, given the appearance of this mass on imaging, if white count is still elevated, then I would favor a course of antibiotics prior to surgery.  Patient has not had any vaginal bleeding since her ablation. Her lining looks somewhat thickened on ultrasound. We discussed possibility of endometrial biopsy or sampling at the time of surgery.  With regard to surgery, I recommend that we proceed with robotic unilateral oophorectomy and bilateral salpingectomy. She has finished with childbearing. I discuss the benefits of retaining one ovary if normal in appearance to allow her to go through menopause naturally. Given her obesity, she is at increased surgical risk and I would not recommend additional procedures unless medically indicated. Plan will be for removal of the adnexal mass. It will be placed in a contained bag for drainage  and decompression. Mass will then be sent to pathology for frozen section at the time of surgery. If no malignancy or borderline to identified, no additional procedures would be performed. If borderline tumor identified, I recommend that we leave one over in situ and perform total hysterectomy. Given the risk of recurrence, the patient would ultimately benefit from completion surgery with removal of her remaining ovary but ideally in at least several years. If malignancy identified, then we discussed possible staging procedures including lymph node removal and omentectomy.  We discussed plan for a robotic assisted unilateral oophorectomy, bilateral salpingectomy, possible total hysterectomy, possible staging, D&C. The risks of surgery were discussed in detail and she understands these to include infection; wound separation; hernia; vaginal cuff separation, injury to adjacent organs such as bowel, bladder, blood vessels, ureters and nerves; bleeding which may require blood transfusion; anesthesia risk; thromboembolic events; possible death; unforeseen complications; possible need for re-exploration; medical complications such as heart attack, stroke, pleural effusion and pneumonia; and, if full lymphadenectomy is performed the risk of lymphedema and lymphocyst. The patient will receive DVT and antibiotic prophylaxis as indicated. She voiced a clear understanding. She had the opportunity to ask questions. Perioperative instructions were reviewed with her. Prescriptions for post-op medications were sent to her pharmacy of choice.  A copy of this note was sent to the patient's referring provider.   85 minutes of total time was spent for this patient encounter, including preparation, face-to-face counseling with the patient and coordination of care, and documentation of the encounter.   Jeral Pinch, MD  Division of Gynecologic Oncology  Department of Obstetrics and Gynecology  [facility]  ___________________________________________  Chief Complaint: Chief Complaint  Patient presents with   Adnexal mass    History of Present Illness:  Kristin Boyd is a 40 y.o. y.o. female who is seen in consultation at the request of Dr. Gwenlyn Found for an evaluation of adnexal mass.  Patient's history is notable for an endometrial ablation at the age of 30.  She describes her periods as being quite heavy at this time and occurring sometimes multiple times a month.  She then had intermittent pain after the ablation but then in 2019 started to have progressive pain.  She ultimately underwent imaging with CT scan and pelvic ultrasound in August 2020 which showed an 8.3 x 6.6 x 8.9 cm complex cystic and solid left adnexal mass.  Follow-up ultrasound showed a complex and cystic lesion in the left adnexa with a tubular shape measuring 12.6 x 5.6 x 7.4 cm; mass on imaging was felt to perhaps represent a tubo-ovarian abscess.  CA125 was obtained at that time and was mildly elevated at 51.  The plan at that time was for referral to GYN oncology, which did not appear to have happened.    She then had multiple visits in the emergency department with persistence of the mass noted.  Most recently, she had a consultation with Dr. Gwenlyn Found at [facility] with a plan to proceed with surgical excision.  Most recent imaging by ultrasound in mid October showed a left adnexal mass measuring 10.3 x 11.4 x 11.2 cm with associated septation and vascularity although poorly visualized.  Patient was seen in our emergency department on the fifth of this month.  She underwent CT and pelvic ultrasound which show persistent mass without significant change in size since most recent ultrasound.    Today, patient describes her pain history as being intermittent at first and now more constant.  She has had multiple visits to multiple emergency departments for this pain since 2019.  Over time, the pain has  worsened.  It typically starts in the mid left side of her abdomen and can radiate to her hips and back.  The pain will sometimes start in other locations.  She has a hard time getting comfortable and sleeping.  She has been using oxycodone as well as ibuprofen with some relief.  She uses the oxycodone mostly at night although took her last dose this morning.  She denies any fevers or chills.  She notes that appetite has been up and down, denies any nausea, or emesis.  She has occasional bloating and early satiety.  She notes pelvic pressure when she is not having pain in her abdomen.  She reports regular bowel function, denies any bladder symptoms.  Patient lives in [place] with her 35 year old child as well as her god sister and godson.  She is not currently working.  PAST MEDICAL HISTORY:  Past Medical History:  Diagnosis Date   Chronic knee pain    Chronic pelvic pain in female    Obese    Ovarian cyst    Pneumonia due to COVID-19 virus 05/26/2019     PAST SURGICAL HISTORY:  Past Surgical History:  Procedure Laterality Date   CESAREAN SECTION     CHOLECYSTECTOMY     ENDOMETRIAL ABLATION      OB/GYN HISTORY:  OB History  Gravida Para Term Preterm AB Living  4            SAB IAB Ectopic Multiple Live Births               #  Outcome Date GA Lbr Len/2nd Weight Sex Delivery Anes PTL Lv  4 Gravida           3 Gravida           2 Gravida           1 Gravida             No LMP recorded. Patient has had an ablation.  Age at menarche: 27 Age at menopause: Not applicable, patient denies any postmenopausal symptoms Hx of HRT: No Hx of STDs: Yes, patient reports a history of trichomonas approximately 3 years ago and being treated for both chlamydia and gonorrhea more than 10 years ago Last pap: 10/11/2021, negative for intraepithelial lesion or malignancy History of abnormal pap smears: Yes, patient reports prior abnormal Pap smears, has never had any biopsies or  procedures  SCREENING STUDIES:  Last mammogram: Years ago  Last colonoscopy: Has never had  MEDICATIONS: Outpatient Encounter Medications as of 10/23/2021  Medication Sig   ibuprofen (ADVIL) 600 MG tablet Take 1 tablet (600 mg total) by mouth every 6 (six) hours as needed for moderate pain. For AFTER surgery only   oxyCODONE (OXY IR/ROXICODONE) 5 MG immediate release tablet Take 1 tablet (5 mg total) by mouth every 6 (six) hours as needed for severe pain. To be used for before surgery for acute abdominal pain, do not take and drive   senna-docusate (SENOKOT-S) 8.6-50 MG tablet Take 2 tablets by mouth at bedtime. For AFTER surgery, do not take if having diarrhea   [DISCONTINUED] ibuprofen (ADVIL) 600 MG tablet Take 600 mg by mouth every 6 (six) hours as needed.   [DISCONTINUED] oxyCODONE (OXY IR/ROXICODONE) 5 MG immediate release tablet Take by mouth.   doxycycline (VIBRAMYCIN) 100 MG capsule Take 1 capsule (100 mg total) by mouth 2 (two) times daily. One po bid x 7 days (Patient not taking: Reported on 10/21/2021)   lidocaine (LIDODERM) 5 % Place 1 patch onto the skin daily. Remove & Discard patch within 12 hours or as directed by MD (Patient not taking: Reported on 10/21/2021)   methocarbamol (ROBAXIN) 500 MG tablet Take 1 tablet (500 mg total) by mouth 2 (two) times daily. (Patient not taking: Reported on 10/21/2021)   vitamin C (VITAMIN C) 500 MG tablet Take 1 tablet (500 mg total) by mouth daily. Please take for 10 days (Patient not taking: Reported on 10/21/2021)   zinc sulfate 220 (50 Zn) MG capsule Take 1 capsule (220 mg total) by mouth daily. Take for 10 days (Patient not taking: Reported on 10/21/2021)   [DISCONTINUED] HYDROcodone-acetaminophen (NORCO/VICODIN) 5-325 MG tablet Take 1 tablet by mouth every 4 (four) hours as needed for moderate pain. (Patient not taking: Reported on 10/21/2021)   No facility-administered encounter medications on file as of 10/23/2021.    ALLERGIES:   Allergies  Allergen Reactions   Peanut (Diagnostic) Anaphylaxis   Penicillins Anaphylaxis    Has patient had a PCN reaction causing immediate rash, facial/tongue/throat swelling, SOB or lightheadedness with hypotension: Yes Has patient had a PCN reaction causing severe rash involving mucus membranes or skin necrosis: Unknown Has patient had a PCN reaction that required hospitalization: Yes Has patient had a PCN reaction occurring within the last 10 years: Yes If all of the above answers are "NO", then may proceed with Cephalosporin use.     FAMILY HISTORY:  Family History  Problem Relation Age of Onset   Colon cancer Neg Hx    Breast cancer Neg Hx  Ovarian cancer Neg Hx    Endometrial cancer Neg Hx    Pancreatic cancer Neg Hx    Prostate cancer Neg Hx      SOCIAL HISTORY:  Social Connections: Not on file    REVIEW OF SYSTEMS:  Pertinent positives include abdominal pain Denies appetite changes, fevers, chills, fatigue, unexplained weight changes. Denies hearing loss, neck lumps or masses, mouth sores, ringing in ears or voice changes. Denies cough or wheezing.  Denies shortness of breath. Denies chest pain or palpitations. Denies leg swelling. Denies abdominal distention, blood in stools, constipation, diarrhea, nausea, vomiting, or early satiety. Denies pain with intercourse, dysuria, frequency, hematuria or incontinence. Denies hot flashes, pelvic pain, vaginal bleeding or vaginal discharge.   Denies joint pain, back pain or muscle pain/cramps. Denies itching, rash, or wounds. Denies dizziness, headaches, numbness or seizures. Denies swollen lymph nodes or glands, denies easy bruising or bleeding. Denies anxiety, depression, confusion, or decreased concentration.  Physical Exam:  Vital Signs for this encounter:  Blood pressure (!) 155/85, pulse 79, temperature (!) 97.3 F (36.3 C), temperature source Tympanic, resp. rate 17, weight (!) 350 lb 4 oz (158.9 kg), SpO2 94  %. Body mass index is 51.72 kg/m. General: Alert, oriented, no acute distress.  HEENT: Normocephalic, atraumatic. Sclera anicteric.  Chest: Clear to auscultation bilaterally. No wheezes, rhonchi, or rales. Cardiovascular: Regular rate and rhythm, no murmurs, rubs, or gallops.  Abdomen: Obese. Normoactive bowel sounds. Soft, nondistended, nontender to palpation. No masses or hepatosplenomegaly appreciated. No palpable fluid wave.  Extremities: Grossly normal range of motion. Warm, well perfused. No edema bilaterally.  Skin: No rashes or lesions.  Lymphatics: No cervical, supraclavicular, or inguinal adenopathy.  GU:  Normal external female genitalia. No lesions. No discharge or bleeding.             Bladder/urethra:  No lesions or masses, well supported bladder             Vagina: Well rugated, no lesions or masses.             Cervix: Normal appearing, no lesions.  No cervical motion tenderness on bimanual exam.             Uterus: Small, mobile, no parametrial involvement or nodularity.             Adnexa: Exam limited by patient's body habitus but there is a mass that sits out of the pelvis that moves moderately with the uterus, unable to appreciate whether this is smooth.  Rectal: Unable to palpate the mass.  LABORATORY AND RADIOLOGIC DATA:  Outside medical records were reviewed to synthesize the above history, along with the history and physical obtained during the visit.   Lab Results  Component Value Date   WBC 9.4 10/23/2021   HGB 12.9 10/23/2021   HCT 40.3 10/23/2021   PLT 330 10/23/2021   GLUCOSE 97 10/12/2021   TRIG 62 05/25/2019   ALT 21 02/02/2021   AST 20 02/02/2021   NA 137 10/12/2021   K 3.9 10/12/2021   CL 107 10/12/2021   CREATININE 0.74 10/12/2021   BUN 13 10/12/2021   CO2 21 (L) 10/12/2021

## 2021-10-23 NOTE — Patient Instructions (Signed)
Preparing for your Surgery  Plan for surgery on November 12, 2021 with Dr. Jeral Pinch at [place] will be scheduled for robotic assisted laparoscopic unilateral oophorectomy (removal of the ovary), bilateral salpingectomy (removal of both fallopian tubes), dilation and curettage of the uterus, possible total hysterectomy (removal of the uterus and cervix), possible bilateral salpingo-oophorectomy (removal of both ovaries and fallopian tubes), possible staging, possible laparotomy (larger incision on your abdomen if needed).   Pre-operative Testing -You will receive a phone call from presurgical testing at [facility] to arrange for a pre-operative appointment and lab work.  -Bring your insurance card, copy of an advanced directive if applicable, medication list  -At that visit, you will be asked to sign a consent for a possible blood transfusion in case a transfusion becomes necessary during surgery.  The need for a blood transfusion is rare but having consent is a necessary part of your care.     -You should not be taking blood thinners or aspirin at least ten days prior to surgery unless instructed by your surgeon.  -Do not take supplements such as fish oil (omega 3), red yeast rice, turmeric before your surgery. You want to avoid medications with aspirin in them including headache powders such as BC or Goody's), Excedrin migraine.  Day Before Surgery at [place] will be asked to take in a light diet the day before surgery. You will be advised you can have clear liquids up until 3 hours before your surgery.    Eat a light diet the day before surgery.  Examples including soups, broths, toast, yogurt, mashed potatoes.  AVOID GAS PRODUCING FOODS. Things to avoid include carbonated beverages (fizzy beverages, sodas), raw fruits and raw vegetables (uncooked), or beans.   If your bowels are filled with gas, your surgeon will have difficulty visualizing your pelvic  organs which increases your surgical risks.  Your role in recovery Your role is to become active as soon as directed by your doctor, while still giving yourself time to heal.  Rest when you feel tired. You will be asked to do the following in order to speed your recovery:  - Cough and breathe deeply. This helps to clear and expand your lungs and can prevent pneumonia after surgery.  - [place]. Do mild physical activity. Walking or moving your legs help your circulation and body functions return to normal. Do not try to get up or walk alone the first time after surgery.   -If you develop swelling on one leg or the other, pain in the back of your leg, redness/warmth in one of your legs, please call the office or go to the Emergency Room to have a doppler to rule out a blood clot. For shortness of breath, chest pain-seek care in the Emergency Room as soon as possible. - Actively manage your pain. Managing your pain lets you move in comfort. We will ask you to rate your pain on a scale of zero to 10. It is your responsibility to tell your doctor or nurse where and how much you hurt so your pain can be treated.  Special Considerations -If you are diabetic, you may be placed on insulin after surgery to have closer control over your blood sugars to promote healing and recovery.  This does not mean that you will be discharged on insulin.  If applicable, your oral antidiabetics will be resumed when you are tolerating a solid diet.  -Your final pathology  results from surgery should be available around one week after surgery and the results will be relayed to you when available.  -Dr. Lahoma Crocker is the surgeon that assists your GYN Oncologist with surgery.  If you end up staying the night, the next day after your surgery you will either see Dr. Berline Lopes or Dr. Lahoma Crocker.  -FMLA forms can be faxed to [phone] and please allow 5-7 business days for completion.  Pain  Management After Surgery -You have been prescribed your pain medication and bowel regimen medications before surgery so that you can have these available when you are discharged from the hospital. YOU CAN USE THE OXYCODONE NOW FOR PAIN.   -Make sure that you have Tylenol and Ibuprofen at home to use on a regular basis after surgery for pain control. We recommend alternating the medications every hour to six hours since they work differently and are processed in the body differently for pain relief.  -Review the attached handout on narcotic use and their risks and side effects.   Bowel Regimen -You have been prescribed Sennakot-S to take nightly to prevent constipation especially if you are taking the narcotic pain medication intermittently.  It is important to prevent constipation and drink adequate amounts of liquids. You can stop taking this medication when you are not taking pain medication and you are back on your normal bowel routine.  Risks of Surgery Risks of surgery are low but include bleeding, infection, damage to surrounding structures, re-operation, blood clots, and very rarely death.   Blood Transfusion Information (For the consent to be signed before surgery)  We will be checking your blood type before surgery so in case of emergencies, we will know what type of blood you would need.                                            WHAT IS A BLOOD TRANSFUSION?  A transfusion is the replacement of blood or some of its parts. Blood is made up of multiple cells which provide different functions. Red blood cells carry oxygen and are used for blood loss replacement. White blood cells fight against infection. Platelets control bleeding. Plasma helps clot blood. Other blood products are available for specialized needs, such as hemophilia or other clotting disorders. BEFORE THE TRANSFUSION  Who gives blood for transfusions?  You may be able to donate blood to be used at a later date on  yourself (autologous donation). Relatives can be asked to donate blood. This is generally not any safer than if you have received blood from a stranger. The same precautions are taken to ensure safety when a relative's blood is donated. Healthy volunteers who are fully evaluated to make sure their blood is safe. This is blood bank blood. Transfusion therapy is the safest it has ever been in the practice of medicine. Before blood is taken from a donor, a complete history is taken to make sure that person has no history of diseases nor engages in risky social behavior (examples are intravenous drug use or sexual activity with multiple partners). The donor's travel history is screened to minimize risk of transmitting infections, such as malaria. The donated blood is tested for signs of infectious diseases, such as HIV and hepatitis. The blood is then tested to be sure it is compatible with you in order to minimize the chance of a transfusion reaction.  If you or a relative donates blood, this is often done in anticipation of surgery and is not appropriate for emergency situations. It takes many days to process the donated blood. RISKS AND COMPLICATIONS Although transfusion therapy is very safe and saves many lives, the main dangers of transfusion include:  Getting an infectious disease. Developing a transfusion reaction. This is an allergic reaction to something in the blood you were given. Every precaution is taken to prevent this. The decision to have a blood transfusion has been considered carefully by your caregiver before blood is given. Blood is not given unless the benefits outweigh the risks.  AFTER SURGERY INSTRUCTIONS  Return to work: 4-6 weeks if applicable  Activity: 1. Be up and out of the bed during the day.  Take a nap if needed.  You may walk up steps but be careful and use the hand rail.  Stair climbing will tire you more than you think, you may need to stop part way and rest.   2. No  lifting or straining for 6 weeks over 10 pounds. No pushing, pulling, straining for 6 weeks.  3. No driving for 1 week(s).  Do not drive if you are taking narcotic pain medicine and make sure that your reaction time has returned.   4. You can shower as soon as the next day after surgery. Shower daily.  Use your regular soap and water (not directly on the incision) and pat your incision(s) dry afterwards; don't rub.  No tub baths or submerging your body in water until cleared by your surgeon. If you have the soap that was given to you by pre-surgical testing that was used before surgery, you do not need to use it afterwards because this can irritate your incisions.   5. No sexual activity and nothing in the vagina for 8 weeks IF YOU HAVE A HYSTERECTOMY, 4 WEEKS IF NOT.  6. You may experience a small amount of clear drainage from your incisions, which is normal.  If the drainage persists, increases, or changes color please call the office.  7. Do not use creams, lotions, or ointments such as neosporin on your incisions after surgery until advised by your surgeon because they can cause removal of the dermabond glue on your incisions.    8. You may experience vaginal spotting after surgery or around the 6-8 week mark from surgery when the stitches at the top of the vagina begin to dissolve IF YOU HAVE A HYSTERECTOMY.  The spotting is normal but if you experience heavy bleeding, call our office.  9. Take Tylenol or ibuprofen first for pain and only use narcotic pain medication for severe pain not relieved by the Tylenol or Ibuprofen.  Monitor your Tylenol intake to a max of 4,000 mg in a 24 hour period. You can alternate these medications after surgery.  Diet: 1. Low sodium Heart Healthy Diet is recommended but you are cleared to resume your normal (before surgery) diet after your procedure.  2. It is safe to use a laxative, such as Miralax or Colace, if you have difficulty moving your bowels. You have  been prescribed Sennakot-S to take at bedtime every evening after surgery to keep bowel movements regular and to prevent constipation.    Wound Care: 1. Keep clean and dry.  Shower daily.  Reasons to call the Doctor: Fever - Oral temperature greater than 100.4 degrees Fahrenheit Foul-smelling vaginal discharge Difficulty urinating Nausea and vomiting Increased pain at the site of the incision that  is unrelieved with pain medicine. Difficulty breathing with or without chest pain New calf pain especially if only on one side Sudden, continuing increased vaginal bleeding with or without clots.   Contacts: For questions or concerns you should contact:  Dr. Jeral Pinch at [phone]  Joylene John, NP at [phone]  After Hours: call [phone] and have the GYN Oncologist paged/contacted (after 5 pm or on the weekends).  Messages sent via mychart are for non-urgent matters and are not responded to after hours so for urgent needs, please call the after hours number.

## 2021-10-24 ENCOUNTER — Other Ambulatory Visit: Payer: Self-pay | Admitting: Gynecologic Oncology

## 2021-10-24 DIAGNOSIS — N9489 Other specified conditions associated with female genital organs and menstrual cycle: Secondary | ICD-10-CM

## 2021-10-24 NOTE — Patient Instructions (Signed)
Preparing for your Surgery   Plan for surgery on November 12, 2021 with Dr. Jeral Pinch at [facility] will be scheduled for robotic assisted laparoscopic unilateral oophorectomy (removal of the ovary), bilateral salpingectomy (removal of both fallopian tubes), dilation and curettage of the uterus, possible total hysterectomy (removal of the uterus and cervix), possible bilateral salpingo-oophorectomy (removal of both ovaries and fallopian tubes), possible staging, possible laparotomy (larger incision on your abdomen if needed).    Pre-operative Testing -You will receive a phone call from presurgical testing at [facility] to arrange for a pre-operative appointment and lab work.   -Bring your insurance card, copy of an advanced directive if applicable, medication list   -At that visit, you will be asked to sign a consent for a possible blood transfusion in case a transfusion becomes necessary during surgery.  The need for a blood transfusion is rare but having consent is a necessary part of your care.      -You should not be taking blood thinners or aspirin at least ten days prior to surgery unless instructed by your surgeon.   -Do not take supplements such as fish oil (omega 3), red yeast rice, turmeric before your surgery. You want to avoid medications with aspirin in them including headache powders such as BC or Goody's), Excedrin migraine.   Day Before Surgery at [place] will be asked to take in a light diet the day before surgery. You will be advised you can have clear liquids up until 3 hours before your surgery.     Eat a light diet the day before surgery.  Examples including soups, broths, toast, yogurt, mashed potatoes.  AVOID GAS PRODUCING FOODS. Things to avoid include carbonated beverages (fizzy beverages, sodas), raw fruits and raw vegetables (uncooked), or beans.    If your bowels are filled with gas, your surgeon will have difficulty visualizing your  pelvic organs which increases your surgical risks.   Your role in recovery Your role is to become active as soon as directed by your doctor, while still giving yourself time to heal.  Rest when you feel tired. You will be asked to do the following in order to speed your recovery:   - Cough and breathe deeply. This helps to clear and expand your lungs and can prevent pneumonia after surgery.  - [place]. Do mild physical activity. Walking or moving your legs help your circulation and body functions return to normal. Do not try to get up or walk alone the first time after surgery.   -If you develop swelling on one leg or the other, pain in the back of your leg, redness/warmth in one of your legs, please call the office or go to the Emergency Room to have a doppler to rule out a blood clot. For shortness of breath, chest pain-seek care in the Emergency Room as soon as possible. - Actively manage your pain. Managing your pain lets you move in comfort. We will ask you to rate your pain on a scale of zero to 10. It is your responsibility to tell your doctor or nurse where and how much you hurt so your pain can be treated.   Special Considerations -If you are diabetic, you may be placed on insulin after surgery to have closer control over your blood sugars to promote healing and recovery.  This does not mean that you will be discharged on insulin.  If applicable, your oral antidiabetics will be  resumed when you are tolerating a solid diet.   -Your final pathology results from surgery should be available around one week after surgery and the results will be relayed to you when available.   -Dr. Lahoma Crocker is the surgeon that assists your GYN Oncologist with surgery.  If you end up staying the night, the next day after your surgery you will either see Dr. Berline Lopes or Dr. Lahoma Crocker.   -FMLA forms can be faxed to [phone] and please allow 5-7 business days for  completion.   Pain Management After Surgery -You have been prescribed your pain medication and bowel regimen medications before surgery so that you can have these available when you are discharged from the hospital. YOU CAN USE THE OXYCODONE NOW FOR PAIN.    -Make sure that you have Tylenol and Ibuprofen at home to use on a regular basis after surgery for pain control. We recommend alternating the medications every hour to six hours since they work differently and are processed in the body differently for pain relief.   -Review the attached handout on narcotic use and their risks and side effects.    Bowel Regimen -You have been prescribed Sennakot-S to take nightly to prevent constipation especially if you are taking the narcotic pain medication intermittently.  It is important to prevent constipation and drink adequate amounts of liquids. You can stop taking this medication when you are not taking pain medication and you are back on your normal bowel routine.   Risks of Surgery Risks of surgery are low but include bleeding, infection, damage to surrounding structures, re-operation, blood clots, and very rarely death.     Blood Transfusion Information (For the consent to be signed before surgery)   We will be checking your blood type before surgery so in case of emergencies, we will know what type of blood you would need.                                             WHAT IS A BLOOD TRANSFUSION?   A transfusion is the replacement of blood or some of its parts. Blood is made up of multiple cells which provide different functions. Red blood cells carry oxygen and are used for blood loss replacement. White blood cells fight against infection. Platelets control bleeding. Plasma helps clot blood. Other blood products are available for specialized needs, such as hemophilia or other clotting disorders. BEFORE THE TRANSFUSION  Who gives blood for transfusions?  You may be able to donate blood to  be used at a later date on yourself (autologous donation). Relatives can be asked to donate blood. This is generally not any safer than if you have received blood from a stranger. The same precautions are taken to ensure safety when a relative's blood is donated. Healthy volunteers who are fully evaluated to make sure their blood is safe. This is blood bank blood. Transfusion therapy is the safest it has ever been in the practice of medicine. Before blood is taken from a donor, a complete history is taken to make sure that person has no history of diseases nor engages in risky social behavior (examples are intravenous drug use or sexual activity with multiple partners). The donor's travel history is screened to minimize risk of transmitting infections, such as malaria. The donated blood is tested for signs of infectious diseases, such as HIV  and hepatitis. The blood is then tested to be sure it is compatible with you in order to minimize the chance of a transfusion reaction. If you or a relative donates blood, this is often done in anticipation of surgery and is not appropriate for emergency situations. It takes many days to process the donated blood. RISKS AND COMPLICATIONS Although transfusion therapy is very safe and saves many lives, the main dangers of transfusion include:  Getting an infectious disease. Developing a transfusion reaction. This is an allergic reaction to something in the blood you were given. Every precaution is taken to prevent this. The decision to have a blood transfusion has been considered carefully by your caregiver before blood is given. Blood is not given unless the benefits outweigh the risks.   AFTER SURGERY INSTRUCTIONS   Return to work: 4-6 weeks if applicable   Activity: 1. Be up and out of the bed during the day.  Take a nap if needed.  You may walk up steps but be careful and use the hand rail.  Stair climbing will tire you more than you think, you may need to stop  part way and rest.    2. No lifting or straining for 6 weeks over 10 pounds. No pushing, pulling, straining for 6 weeks.   3. No driving for 1 week(s).  Do not drive if you are taking narcotic pain medicine and make sure that your reaction time has returned.    4. You can shower as soon as the next day after surgery. Shower daily.  Use your regular soap and water (not directly on the incision) and pat your incision(s) dry afterwards; don't rub.  No tub baths or submerging your body in water until cleared by your surgeon. If you have the soap that was given to you by pre-surgical testing that was used before surgery, you do not need to use it afterwards because this can irritate your incisions.    5. No sexual activity and nothing in the vagina for 8 weeks IF YOU HAVE A HYSTERECTOMY, 4 WEEKS IF NOT.   6. You may experience a small amount of clear drainage from your incisions, which is normal.  If the drainage persists, increases, or changes color please call the office.   7. Do not use creams, lotions, or ointments such as neosporin on your incisions after surgery until advised by your surgeon because they can cause removal of the dermabond glue on your incisions.     8. You may experience vaginal spotting after surgery or around the 6-8 week mark from surgery when the stitches at the top of the vagina begin to dissolve IF YOU HAVE A HYSTERECTOMY.  The spotting is normal but if you experience heavy bleeding, call our office.   9. Take Tylenol or ibuprofen first for pain and only use narcotic pain medication for severe pain not relieved by the Tylenol or Ibuprofen.  Monitor your Tylenol intake to a max of 4,000 mg in a 24 hour period. You can alternate these medications after surgery.   Diet: 1. Low sodium Heart Healthy Diet is recommended but you are cleared to resume your normal (before surgery) diet after your procedure.   2. It is safe to use a laxative, such as Miralax or Colace, if you have  difficulty moving your bowels. You have been prescribed Sennakot-S to take at bedtime every evening after surgery to keep bowel movements regular and to prevent constipation.     Wound Care: 1.  Keep clean and dry.  Shower daily.   Reasons to call the Doctor: Fever - Oral temperature greater than 100.4 degrees Fahrenheit Foul-smelling vaginal discharge Difficulty urinating Nausea and vomiting Increased pain at the site of the incision that is unrelieved with pain medicine. Difficulty breathing with or without chest pain New calf pain especially if only on one side Sudden, continuing increased vaginal bleeding with or without clots.   Contacts: For questions or concerns you should contact:   Dr. Jeral Pinch at [phone]   Joylene John, NP at [phone]   After Hours: call [phone] and have the GYN Oncologist paged/contacted (after 5 pm or on the weekends).   Messages sent via mychart are for non-urgent matters and are not responded to after hours so for urgent needs, please call the after hours number.

## 2021-10-24 NOTE — Progress Notes (Signed)
Sent message, via epic in basket, requesting orders in epic from surgeon.  

## 2021-10-24 NOTE — Addendum Note (Signed)
Addended by: Joylene John D on: 10/24/2021 02:21 PM   Modules accepted: Orders

## 2021-10-24 NOTE — Progress Notes (Signed)
Patient here for new patient consultation with Dr. Jeral Pinch for a complex adnexal mass and for a pre-operative discussion prior to her scheduled surgery on November 12, 2021. She is scheduled for robotic assisted laparoscopic unilateral oophorectomy, bilateral salpingectomy, dilation and curettage of the uterus, possible total hysterectomy, possible bilateral salpingo-oophorectomy, possible staging, possible laparotomy. The surgery was discussed in detail.  See after visit summary for additional details. Visual aids used to discuss items related to surgery including the sequential compression stockings, foley catheter, IV pump, multi-modal pain regimen including tylenol, photo of the surgical robot, female reproductive system to discuss surgery in detail.      Discussed post-op pain management in detail including the aspects of the enhanced recovery pathway.  Advised her that a new prescription would be sent in for oxycodone per Dr. Berline Lopes to be used for acute abdominal pain preoperatively.  We discussed the use of tylenol pre-op and post-op and to monitor for a maximum of 4,000 mg in a 24 hour period.  Also prescribed sennakot to be used after surgery and to hold if having loose stools.  Discussed bowel regimen in detail.     Discussed the use of SCDs and measures to take at home to prevent DVT including frequent mobility.  Reportable signs and symptoms of DVT discussed. Post-operative instructions discussed and expectations for after surgery. Incisional care discussed as well including reportable signs and symptoms including erythema, drainage, wound separation.     5 minutes spent with the patient.  Verbalizing understanding of material discussed. No needs or concerns voiced at the end of the visit.   Advised patient to call for any needs.  Advised that her medications had been prescribed and could be picked up at any time. A CBC will be checked today to look at the WBC count to evaluate if there is  a need for pre-operative antibiotic therapy.  This appointment is included in the global surgical bundle as pre-operative teaching and has no charge.

## 2021-11-05 NOTE — Patient Instructions (Addendum)
DUE TO COVID-19 ONLY ONE VISITOR IS ALLOWED TO COME WITH YOU AND STAY IN THE WAITING ROOM ONLY DURING PRE OP AND PROCEDURE.   **NO VISITORS ARE ALLOWED IN THE SHORT STAY AREA OR RECOVERY ROOM!!**       Your procedure is scheduled on: 11/12/21   Report to [facility] Main Entrance    Report to admitting at 8:30 AM   Call this number if you have problems the morning of surgery [phone]   Do not eat food :After Midnight.   May have liquids until 7:45 AM day of surgery  CLEAR LIQUID DIET  Foods Allowed                                                                     Foods Excluded  Water, Black Coffee and tea (no milk or creamer)           liquids that you cannot  Plain Jell-O in any flavor  (No red)                                    see through such as: Fruit ices (not with fruit pulp)                                            milk, soups, orange juice              Iced Popsicles (No red)                                               All solid food                                   Apple juices Sports drinks like Gatorade (No red) Lightly seasoned clear broth or consume(fat free) Sugar    The day of surgery:  Drink ONE (1) Pre-Surgery Clear Ensure by 7:45 am the morning of surgery. Drink in one sitting. Do not sip.  This drink was given to you during your hospital  pre-op appointment visit. Nothing else to drink after completing the  Pre-Surgery Clear Ensure.          If you have questions, please contact your surgeon's office.     Oral Hygiene is also important to reduce your risk of infection.                                    Remember - BRUSH YOUR TEETH THE MORNING OF SURGERY WITH YOUR REGULAR TOOTHPASTE   Take these medicines the morning of surgery with A SIP OF WATER: Tylenol                              You may not  have any metal on your body including hair pins, jewelry, and body piercing             Do not wear make-up, lotions, powders, perfume,  or deodorant  Do not wear nail polish including gel and S&S, artificial/acrylic nails, or any other type of covering on natural nails including finger and toenails. If you have artificial nails, gel coating, etc. that needs to be removed by a nail salon please have this removed prior to surgery or surgery may need to be canceled/ delayed if the surgeon/ anesthesia feels like they are unable to be safely monitored.   Do not shave  48 hours prior to surgery.    Do not bring valuables to the hospital. [place].    Patients discharged on the day of surgery will not be allowed to drive home.              Please read over the following fact sheets you were given: IF YOU HAVE QUESTIONS ABOUT YOUR PRE-OP INSTRUCTIONS PLEASE CALL [facility] - Preparing for Surgery Before surgery, you can play an important role.  Because skin is not sterile, your skin needs to be as free of germs as possible.  You can reduce the number of germs on your skin by washing with CHG (chlorahexidine gluconate) soap before surgery.  CHG is an antiseptic cleaner which kills germs and bonds with the skin to continue killing germs even after washing. Please DO NOT use if you have an allergy to CHG or antibacterial soaps.  If your skin becomes reddened/irritated stop using the CHG and inform your nurse when you arrive at Short Stay. Do not shave (including legs and underarms) for at least 48 hours prior to the first CHG shower.  You may shave your face/neck.  Please follow these instructions carefully:  1.  Shower with CHG Soap the night before surgery and the  morning of surgery.  2.  If you choose to wash your hair, wash your hair first as usual with your normal  shampoo.  3.  After you shampoo, rinse your hair and body thoroughly to remove the shampoo.                             4.  Use CHG as you would any other liquid soap.  You can apply chg directly to  the skin and wash.  Gently with a scrungie or clean washcloth.  5.  Apply the CHG Soap to your body ONLY FROM THE NECK DOWN.   Do   not use on face/ open                           Wound or open sores. Avoid contact with eyes, ears mouth and   genitals (private parts).                       Wash face,  Genitals (private parts) with your normal soap.             6.  Wash thoroughly, paying special attention to the area where your    surgery  will be performed.  7.  Thoroughly rinse your body with warm water from the neck down.  8.  DO NOT  shower/wash with your normal soap after using and rinsing off the CHG Soap.                9.  Pat yourself dry with a clean towel.            10.  Wear clean pajamas.            11.  Place clean sheets on your bed the night of your first shower and do not  sleep with pets. Day of Surgery : Do not apply any lotions/deodorants the morning of surgery.  Please wear clean clothes to the hospital/surgery center.  FAILURE TO FOLLOW THESE INSTRUCTIONS MAY RESULT IN THE CANCELLATION OF YOUR SURGERY  PATIENT SIGNATURE_________________________________  NURSE SIGNATURE__________________________________  ________________________________________________________________________  WHAT IS A BLOOD TRANSFUSION? Blood Transfusion Information  A transfusion is the replacement of blood or some of its parts. Blood is made up of multiple cells which provide different functions. Red blood cells carry oxygen and are used for blood loss replacement. White blood cells fight against infection. Platelets control bleeding. Plasma helps clot blood. Other blood products are available for specialized needs, such as hemophilia or other clotting disorders. BEFORE THE TRANSFUSION  Who gives blood for transfusions?  Healthy volunteers who are fully evaluated to make sure their blood is safe. This is blood bank blood. Transfusion therapy is the safest it has ever been in the practice of  medicine. Before blood is taken from a donor, a complete history is taken to make sure that person has no history of diseases nor engages in risky social behavior (examples are intravenous drug use or sexual activity with multiple partners). The donor's travel history is screened to minimize risk of transmitting infections, such as malaria. The donated blood is tested for signs of infectious diseases, such as HIV and hepatitis. The blood is then tested to be sure it is compatible with you in order to minimize the chance of a transfusion reaction. If you or a relative donates blood, this is often done in anticipation of surgery and is not appropriate for emergency situations. It takes many days to process the donated blood. RISKS AND COMPLICATIONS Although transfusion therapy is very safe and saves many lives, the main dangers of transfusion include:  Getting an infectious disease. Developing a transfusion reaction. This is an allergic reaction to something in the blood you were given. Every precaution is taken to prevent this. The decision to have a blood transfusion has been considered carefully by your caregiver before blood is given. Blood is not given unless the benefits outweigh the risks. AFTER THE TRANSFUSION Right after receiving a blood transfusion, you will usually feel much better and more energetic. This is especially true if your red blood cells have gotten low (anemic). The transfusion raises the level of the red blood cells which carry oxygen, and this usually causes an energy increase. The nurse administering the transfusion will monitor you carefully for complications. HOME CARE INSTRUCTIONS  No special instructions are needed after a transfusion. You may find your energy is better. Speak with your caregiver about any limitations on activity for underlying diseases you may have. SEEK MEDICAL CARE IF:  Your condition is not improving after your transfusion. You develop redness or  irritation at the intravenous (IV) site. SEEK IMMEDIATE MEDICAL CARE IF:  Any of the following symptoms occur over the next 12 hours: Shaking chills. You have a temperature by mouth above 102 F (38.9 C), not controlled by medicine. Chest, back,  or muscle pain. People around you feel you are not acting correctly or are confused. Shortness of breath or difficulty breathing. Dizziness and fainting. You get a rash or develop hives. You have a decrease in urine output. Your urine turns a dark color or changes to pink, red, or brown. Any of the following symptoms occur over the next 10 days: You have a temperature by mouth above 102 F (38.9 C), not controlled by medicine. Shortness of breath. Weakness after normal activity. The white part of the eye turns yellow (jaundice). You have a decrease in the amount of urine or are urinating less often. Your urine turns a dark color or changes to pink, red, or brown. Document Released: 11/21/2000 Document Revised: 02/16/2012 Document Reviewed: 07/10/2008 [facility] Patient Information 2014 [place], [place].  _______________________________________________________________________

## 2021-11-05 NOTE — Progress Notes (Addendum)
COVID swab appointment: n/a  COVID Vaccine Completed: no Date COVID Vaccine completed: Has received booster: COVID vaccine manufacturer: [place]   Date of COVID positive in last 90 days: no  PCP - none Cardiologist - n/a  Chest x-ray - n/a EKG - 06/10/21 req [facility] Stress Test - n/a ECHO - n/a Cardiac Cath - n/a Pacemaker/ICD device last checked: n/a Spinal Cord Stimulator: n/a  Sleep Study - n/a CPAP -   Fasting Blood Sugar - n/a Checks Blood Sugar _____ times a day  Blood Thinner Instructions: n/a Aspirin Instructions: Last Dose:  Activity level: Can go up a flight of stairs and perform activities of daily living without stopping and without symptoms of chest pain or shortness of breath.    Anesthesia review:   Patient denies shortness of breath, fever, cough and chest pain at PAT appointment   Patient verbalized understanding of instructions that were given to them at the PAT appointment. Patient was also instructed that they will need to review over the PAT instructions again at home before surgery.

## 2021-11-06 ENCOUNTER — Other Ambulatory Visit: Payer: Self-pay

## 2021-11-06 ENCOUNTER — Encounter (HOSPITAL_COMMUNITY): Payer: Self-pay

## 2021-11-06 ENCOUNTER — Encounter (HOSPITAL_COMMUNITY)
Admission: RE | Admit: 2021-11-06 | Discharge: 2021-11-06 | Disposition: A | Discharge status: Home / Self Care | Payer: 59 | Source: Ambulatory Visit | Attending: Gynecologic Oncology | Admitting: Gynecologic Oncology

## 2021-11-06 DIAGNOSIS — N9489 Other specified conditions associated with female genital organs and menstrual cycle: Secondary | ICD-10-CM | POA: Diagnosis not present

## 2021-11-06 DIAGNOSIS — Z01812 Encounter for preprocedural laboratory examination: Secondary | ICD-10-CM | POA: Diagnosis present

## 2021-11-06 HISTORY — DX: Unspecified osteoarthritis, unspecified site: M19.90

## 2021-11-06 LAB — BASIC METABOLIC PANEL
Anion gap: 6 (ref 5–15)
BUN: 15 mg/dL (ref 6–20)
CO2: 24 mmol/L (ref 22–32)
Calcium: 8.7 mg/dL — ABNORMAL LOW (ref 8.9–10.3)
Chloride: 109 mmol/L (ref 98–111)
Creatinine, Ser: 0.64 mg/dL (ref 0.44–1.00)
GFR, Estimated: >60 mL/min (ref >60)
Glucose, Bld: 87 mg/dL (ref 70–99)
Potassium: 4.1 mmol/L (ref 3.5–5.1)
Sodium: 139 mmol/L (ref 135–145)

## 2021-11-06 LAB — CBC
HCT: 42.4 % (ref 36.0–46.0)
Hemoglobin: 13.3 g/dL (ref 12.0–15.0)
MCH: 26.2 pg (ref 26.0–34.0)
MCHC: 31.4 g/dL (ref 30.0–36.0)
MCV: 83.5 fL (ref 80.0–100.0)
Platelets: 318 10*3/uL (ref 150–400)
RBC: 5.08 MIL/uL (ref 3.87–5.11)
RDW: 15.7 % — ABNORMAL HIGH (ref 11.5–15.5)
WBC: 7.4 10*3/uL (ref 4.0–10.5)
nRBC: 0 % (ref 0.0–0.2)

## 2021-11-06 LAB — PREGNANCY, URINE: Preg Test, Ur: NEGATIVE

## 2021-11-11 ENCOUNTER — Encounter (HOSPITAL_COMMUNITY): Payer: Self-pay | Admitting: Gynecologic Oncology

## 2021-11-11 ENCOUNTER — Telehealth: Payer: Self-pay

## 2021-11-11 NOTE — Telephone Encounter (Signed)
Telephone call to check on pre-operative status.  Patient compliant with pre-operative instructions.  Reinforced nothing to eat after midnight and clear liquids until 0830.  No questions or concerns voiced.  Instructed to call for any needs.

## 2021-11-12 ENCOUNTER — Ambulatory Visit (HOSPITAL_COMMUNITY): Payer: 59 | Admitting: Anesthesiology

## 2021-11-12 ENCOUNTER — Other Ambulatory Visit: Payer: Self-pay

## 2021-11-12 ENCOUNTER — Encounter (HOSPITAL_COMMUNITY)
Admission: RE | Disposition: A | Discharge status: Home / Self Care | Payer: Self-pay | Source: Home / Self Care | Attending: Gynecologic Oncology

## 2021-11-12 ENCOUNTER — Ambulatory Visit (HOSPITAL_COMMUNITY)
Admission: RE | Admit: 2021-11-12 | Discharge: 2021-11-13 | Disposition: A | Discharge status: Home / Self Care | Payer: 59 | Attending: Gynecologic Oncology | Admitting: Gynecologic Oncology

## 2021-11-12 ENCOUNTER — Encounter (HOSPITAL_COMMUNITY): Payer: Self-pay | Admitting: Gynecologic Oncology

## 2021-11-12 DIAGNOSIS — N809 Endometriosis, unspecified: Secondary | ICD-10-CM | POA: Diagnosis not present

## 2021-11-12 DIAGNOSIS — N882 Stricture and stenosis of cervix uteri: Secondary | ICD-10-CM | POA: Diagnosis not present

## 2021-11-12 DIAGNOSIS — R9389 Abnormal findings on diagnostic imaging of other specified body structures: Secondary | ICD-10-CM | POA: Diagnosis not present

## 2021-11-12 DIAGNOSIS — Z9889 Other specified postprocedural states: Secondary | ICD-10-CM

## 2021-11-12 DIAGNOSIS — N9489 Other specified conditions associated with female genital organs and menstrual cycle: Secondary | ICD-10-CM

## 2021-11-12 DIAGNOSIS — N803 Endometriosis of pelvic peritoneum, unspecified: Secondary | ICD-10-CM | POA: Diagnosis not present

## 2021-11-12 DIAGNOSIS — Z87891 Personal history of nicotine dependence: Secondary | ICD-10-CM | POA: Diagnosis not present

## 2021-11-12 DIAGNOSIS — Z6841 Body Mass Index (BMI) 40.0 and over, adult: Secondary | ICD-10-CM | POA: Insufficient documentation

## 2021-11-12 HISTORY — DX: Other specified postprocedural states: Z98.890

## 2021-11-12 HISTORY — PX: DILATION AND CURETTAGE OF UTERUS: SHX78

## 2021-11-12 HISTORY — PX: XI ROBOTIC ASSISTED SALPINGECTOMY: SHX6824

## 2021-11-12 LAB — TYPE AND SCREEN
ABO/RH(D): O POS
Antibody Screen: NEGATIVE

## 2021-11-12 LAB — HCG, SERUM, QUALITATIVE: Preg, Serum: NEGATIVE

## 2021-11-12 SURGERY — SALPINGECTOMY, ROBOT-ASSISTED
Anesthesia: General

## 2021-11-12 MED ORDER — SUGAMMADEX SODIUM 500 MG/5ML IV SOLN
INTRAVENOUS | Status: DC | PRN
Start: 2021-11-12 — End: 2021-11-12
  Administered 2021-11-12: 500 mg via INTRAVENOUS

## 2021-11-12 MED ORDER — ACETAMINOPHEN 10 MG/ML IV SOLN
INTRAVENOUS | Status: AC
  Filled 2021-11-12: qty 100

## 2021-11-12 MED ORDER — OXYCODONE HCL 5 MG PO TABS: 5.0000 mg | ORAL_TABLET | Freq: Once | ORAL | Status: AC | PRN

## 2021-11-12 MED ORDER — CLINDAMYCIN PHOSPHATE 900 MG/50ML IV SOLN
900.0000 mg | INTRAVENOUS | Status: AC
  Administered 2021-11-12: 900 mg via INTRAVENOUS

## 2021-11-12 MED ORDER — OXYCODONE HCL 5 MG/5ML PO SOLN: 5.0000 mg | Freq: Once | ORAL | Status: AC | PRN

## 2021-11-12 MED ORDER — CELECOXIB 200 MG PO CAPS: 400.0000 mg | ORAL_CAPSULE | ORAL | Status: AC

## 2021-11-12 MED ORDER — ORAL CARE MOUTH RINSE: 15.0000 mL | Freq: Once | OROMUCOSAL | Status: AC

## 2021-11-12 MED ORDER — SCOPOLAMINE 1 MG/3DAYS TD PT72
MEDICATED_PATCH | TRANSDERMAL | Status: AC
  Administered 2021-11-12: 1.5 mg via TRANSDERMAL
  Filled 2021-11-12: qty 1

## 2021-11-12 MED ORDER — ACETAMINOPHEN 500 MG PO TABS: 1000.0000 mg | ORAL_TABLET | ORAL | Status: DC

## 2021-11-12 MED ORDER — GLYCOPYRROLATE PF 0.2 MG/ML IJ SOSY
PREFILLED_SYRINGE | INTRAMUSCULAR | Status: DC | PRN
  Administered 2021-11-12: .2 mg via INTRAVENOUS

## 2021-11-12 MED ORDER — CLINDAMYCIN PHOSPHATE 900 MG/50ML IV SOLN
INTRAVENOUS | Status: AC
  Filled 2021-11-12: qty 50

## 2021-11-12 MED ORDER — HEPARIN SODIUM (PORCINE) 5000 UNIT/ML IJ SOLN
INTRAMUSCULAR | Status: AC
  Administered 2021-11-12: 5000 [IU] via SUBCUTANEOUS
  Filled 2021-11-12: qty 1

## 2021-11-12 MED ORDER — ONDANSETRON HCL 4 MG/2ML IJ SOLN
INTRAMUSCULAR | Status: DC | PRN
  Administered 2021-11-12: 4 mg via INTRAVENOUS

## 2021-11-12 MED ORDER — SENNOSIDES-DOCUSATE SODIUM 8.6-50 MG PO TABS: 1.0000 | ORAL_TABLET | Freq: Every evening | ORAL | Status: DC | PRN

## 2021-11-12 MED ORDER — CELECOXIB 200 MG PO CAPS
ORAL_CAPSULE | ORAL | Status: AC
  Administered 2021-11-12: 400 mg via ORAL
  Filled 2021-11-12: qty 2

## 2021-11-12 MED ORDER — CHLORHEXIDINE GLUCONATE 0.12 % MT SOLN
15.0000 mL | Freq: Once | OROMUCOSAL | Status: AC
  Administered 2021-11-12: 15 mL via OROMUCOSAL

## 2021-11-12 MED ORDER — SUCCINYLCHOLINE CHLORIDE 200 MG/10ML IV SOSY
PREFILLED_SYRINGE | INTRAVENOUS | Status: DC | PRN
  Administered 2021-11-12: 140 mg via INTRAVENOUS

## 2021-11-12 MED ORDER — CIPROFLOXACIN IN D5W 400 MG/200ML IV SOLN
400.0000 mg | INTRAVENOUS | Status: AC
  Administered 2021-11-12: 400 mg via INTRAVENOUS

## 2021-11-12 MED ORDER — FENTANYL CITRATE (PF) 100 MCG/2ML IJ SOLN
INTRAMUSCULAR | Status: DC | PRN
  Administered 2021-11-12: 100 ug via INTRAVENOUS
  Administered 2021-11-12: 50 ug via INTRAVENOUS

## 2021-11-12 MED ORDER — ACETAMINOPHEN 500 MG PO TABS
1000.0000 mg | ORAL_TABLET | Freq: Once | ORAL | Status: DC
  Filled 2021-11-12: qty 2

## 2021-11-12 MED ORDER — STERILE WATER FOR INJECTION IJ SOLN
INTRAMUSCULAR | Status: AC
  Filled 2021-11-12: qty 10

## 2021-11-12 MED ORDER — PROPOFOL 10 MG/ML IV BOLUS
INTRAVENOUS | Status: DC | PRN
  Administered 2021-11-12: 200 mg via INTRAVENOUS

## 2021-11-12 MED ORDER — ACETAMINOPHEN 10 MG/ML IV SOLN
1000.0000 mg | Freq: Once | INTRAVENOUS | Status: AC
  Administered 2021-11-12: 1000 mg via INTRAVENOUS

## 2021-11-12 MED ORDER — ACETAMINOPHEN 325 MG PO TABS: 650.0000 mg | ORAL_TABLET | ORAL | Status: DC | PRN

## 2021-11-12 MED ORDER — LIDOCAINE 2% (20 MG/ML) 5 ML SYRINGE
INTRAMUSCULAR | Status: DC | PRN
Start: 2021-11-12 — End: 2021-11-12
  Administered 2021-11-12: 100 mg via INTRAVENOUS

## 2021-11-12 MED ORDER — OXYCODONE HCL 5 MG PO TABS
ORAL_TABLET | ORAL | Status: AC
  Administered 2021-11-12: 5 mg via ORAL
  Filled 2021-11-12: qty 1

## 2021-11-12 MED ORDER — FENTANYL CITRATE PF 50 MCG/ML IJ SOSY
PREFILLED_SYRINGE | INTRAMUSCULAR | Status: AC
  Administered 2021-11-12: 50 ug via INTRAVENOUS
  Filled 2021-11-12: qty 1

## 2021-11-12 MED ORDER — SUCCINYLCHOLINE CHLORIDE 200 MG/10ML IV SOSY
PREFILLED_SYRINGE | INTRAVENOUS | Status: AC
  Filled 2021-11-12: qty 10

## 2021-11-12 MED ORDER — ROCURONIUM BROMIDE 10 MG/ML (PF) SYRINGE
PREFILLED_SYRINGE | INTRAVENOUS | Status: AC
  Filled 2021-11-12: qty 10

## 2021-11-12 MED ORDER — STERILE WATER FOR IRRIGATION IR SOLN
Status: DC | PRN
  Administered 2021-11-12: 1000 mL

## 2021-11-12 MED ORDER — ENSURE PRE-SURGERY PO LIQD: 296.0000 mL | Freq: Once | ORAL | Status: DC

## 2021-11-12 MED ORDER — SIMETHICONE 80 MG PO CHEW: 80.0000 mg | CHEWABLE_TABLET | Freq: Four times a day (QID) | ORAL | Status: DC | PRN

## 2021-11-12 MED ORDER — MIDAZOLAM HCL 5 MG/5ML IJ SOLN
INTRAMUSCULAR | Status: DC | PRN
  Administered 2021-11-12: 2 mg via INTRAVENOUS

## 2021-11-12 MED ORDER — LACTATED RINGERS IR SOLN
Status: DC | PRN
  Administered 2021-11-12: 1000 mL

## 2021-11-12 MED ORDER — DEXAMETHASONE SODIUM PHOSPHATE 10 MG/ML IJ SOLN
INTRAMUSCULAR | Status: DC | PRN
  Administered 2021-11-12: 10 mg via INTRAVENOUS

## 2021-11-12 MED ORDER — GABAPENTIN 300 MG PO CAPS: 300.0000 mg | ORAL_CAPSULE | ORAL | Status: AC

## 2021-11-12 MED ORDER — MIDAZOLAM HCL 2 MG/2ML IJ SOLN
INTRAMUSCULAR | Status: AC
  Filled 2021-11-12: qty 2

## 2021-11-12 MED ORDER — EPHEDRINE 5 MG/ML INJ
INTRAVENOUS | Status: AC
  Filled 2021-11-12: qty 5

## 2021-11-12 MED ORDER — IBUPROFEN 200 MG PO TABS
600.0000 mg | ORAL_TABLET | Freq: Four times a day (QID) | ORAL | Status: DC
  Administered 2021-11-12 – 2021-11-13 (×3): 600 mg via ORAL
  Filled 2021-11-12 (×3): qty 3

## 2021-11-12 MED ORDER — SODIUM CHLORIDE 0.9 % IR SOLN
Status: DC | PRN
  Administered 2021-11-12: 3000 mL

## 2021-11-12 MED ORDER — DEXAMETHASONE SODIUM PHOSPHATE 4 MG/ML IJ SOLN: 4.0000 mg | INTRAMUSCULAR | Status: DC

## 2021-11-12 MED ORDER — CIPROFLOXACIN IN D5W 400 MG/200ML IV SOLN
INTRAVENOUS | Status: AC
  Filled 2021-11-12: qty 200

## 2021-11-12 MED ORDER — GABAPENTIN 300 MG PO CAPS
ORAL_CAPSULE | ORAL | Status: AC
  Administered 2021-11-12: 300 mg via ORAL
  Filled 2021-11-12: qty 1

## 2021-11-12 MED ORDER — FENTANYL CITRATE (PF) 250 MCG/5ML IJ SOLN
INTRAMUSCULAR | Status: AC
  Filled 2021-11-12: qty 5

## 2021-11-12 MED ORDER — FENTANYL CITRATE PF 50 MCG/ML IJ SOSY: 25.0000 ug | PREFILLED_SYRINGE | INTRAMUSCULAR | Status: DC | PRN

## 2021-11-12 MED ORDER — BUPIVACAINE HCL 0.25 % IJ SOLN
INTRAMUSCULAR | Status: DC | PRN
  Administered 2021-11-12: 32 mL

## 2021-11-12 MED ORDER — SCOPOLAMINE 1 MG/3DAYS TD PT72: 1.0000 | MEDICATED_PATCH | TRANSDERMAL | Status: DC

## 2021-11-12 MED ORDER — HEPARIN SODIUM (PORCINE) 5000 UNIT/ML IJ SOLN: 5000.0000 [IU] | INTRAMUSCULAR | Status: AC

## 2021-11-12 MED ORDER — LACTATED RINGERS IV SOLN: INTRAVENOUS | Status: DC

## 2021-11-12 MED ORDER — GLYCOPYRROLATE 0.2 MG/ML IJ SOLN
INTRAMUSCULAR | Status: AC
  Filled 2021-11-12: qty 4

## 2021-11-12 MED ORDER — BUPIVACAINE HCL 0.25 % IJ SOLN
INTRAMUSCULAR | Status: AC
  Filled 2021-11-12: qty 1

## 2021-11-12 MED ORDER — ROCURONIUM BROMIDE 10 MG/ML (PF) SYRINGE
PREFILLED_SYRINGE | INTRAVENOUS | Status: DC | PRN
Start: 2021-11-12 — End: 2021-11-12
  Administered 2021-11-12: 30 mg via INTRAVENOUS
  Administered 2021-11-12: 70 mg via INTRAVENOUS
  Administered 2021-11-12: 10 mg via INTRAVENOUS

## 2021-11-12 MED ORDER — AMISULPRIDE (ANTIEMETIC) 5 MG/2ML IV SOLN: 10.0000 mg | Freq: Once | INTRAVENOUS | Status: DC | PRN

## 2021-11-12 MED ORDER — OXYCODONE HCL 5 MG PO TABS
5.0000 mg | ORAL_TABLET | ORAL | Status: DC | PRN
  Administered 2021-11-12: 5 mg via ORAL
  Filled 2021-11-12: qty 1

## 2021-11-12 MED ORDER — LACTATED RINGERS IV SOLN: INTRAVENOUS | Status: DC | PRN

## 2021-11-12 MED ORDER — ONDANSETRON HCL 4 MG/2ML IJ SOLN: 4.0000 mg | Freq: Once | INTRAMUSCULAR | Status: DC | PRN

## 2021-11-12 MED ORDER — SUGAMMADEX SODIUM 500 MG/5ML IV SOLN
INTRAVENOUS | Status: AC
  Filled 2021-11-12: qty 5

## 2021-11-12 SURGICAL SUPPLY — 79 items
APPLICATOR SURGIFLO ENDO (HEMOSTASIS) IMPLANT
BAG COUNTER SPONGE SURGICOUNT (BAG) IMPLANT
BAG LAPAROSCOPIC 12 15 PORT 16 (BASKET) IMPLANT
BAG RETRIEVAL 12/15 (BASKET)
BLADE SURG SZ10 CARB STEEL (BLADE) IMPLANT
CATH ROBINSON RED A/P 16FR (CATHETERS) ×2 IMPLANT
COVER BACK TABLE 60X90IN (DRAPES) ×2 IMPLANT
COVER TIP SHEARS 8 DVNC (MISCELLANEOUS) ×1 IMPLANT
COVER TIP SHEARS 8MM DA VINCI (MISCELLANEOUS) ×1
DECANTER SPIKE VIAL GLASS SM (MISCELLANEOUS) IMPLANT
DERMABOND ADVANCED (GAUZE/BANDAGES/DRESSINGS) ×1
DERMABOND ADVANCED .7 DNX12 (GAUZE/BANDAGES/DRESSINGS) ×1 IMPLANT
DRAPE ARM DVNC X/XI (DISPOSABLE) ×4 IMPLANT
DRAPE COLUMN DVNC XI (DISPOSABLE) ×1 IMPLANT
DRAPE DA VINCI XI ARM (DISPOSABLE) ×4
DRAPE DA VINCI XI COLUMN (DISPOSABLE) ×1
DRAPE SHEET LG 3/4 BI-LAMINATE (DRAPES) ×2 IMPLANT
DRAPE SURG IRRIG POUCH 19X23 (DRAPES) ×2 IMPLANT
DRAPE UNDERBUTTOCKS STRL (DISPOSABLE) ×2 IMPLANT
DRSG OPSITE POSTOP 4X6 (GAUZE/BANDAGES/DRESSINGS) IMPLANT
DRSG OPSITE POSTOP 4X8 (GAUZE/BANDAGES/DRESSINGS) IMPLANT
DRSG TELFA 3X8 NADH (GAUZE/BANDAGES/DRESSINGS) ×2 IMPLANT
ELECT PENCIL ROCKER SW 15FT (MISCELLANEOUS) IMPLANT
ELECT REM PT RETURN 15FT ADLT (MISCELLANEOUS) ×2 IMPLANT
GAUZE 4X4 16PLY ~~LOC~~+RFID DBL (SPONGE) ×2 IMPLANT
GLOVE SURG ENC MOIS LTX SZ6 (GLOVE) ×8 IMPLANT
GLOVE SURG ENC MOIS LTX SZ6.5 (GLOVE) ×4 IMPLANT
GOWN STRL REUS W/ TWL LRG LVL3 (GOWN DISPOSABLE) ×4 IMPLANT
GOWN STRL REUS W/TWL LRG LVL3 (GOWN DISPOSABLE) ×4
HOLDER FOLEY CATH W/STRAP (MISCELLANEOUS) ×2 IMPLANT
IRRIG SUCT STRYKERFLOW 2 WTIP (MISCELLANEOUS) ×2
IRRIGATION SUCT STRKRFLW 2 WTP (MISCELLANEOUS) ×1 IMPLANT
KIT BASIN OR (CUSTOM PROCEDURE TRAY) ×2 IMPLANT
KIT PROCEDURE DA VINCI SI (MISCELLANEOUS)
KIT PROCEDURE DVNC SI (MISCELLANEOUS) IMPLANT
KIT PROCEDURE FLUENT (KITS) ×1 IMPLANT
KIT TURNOVER KIT A (KITS) IMPLANT
MANIPULATOR ADVINCU DEL 3.0 PL (MISCELLANEOUS) IMPLANT
MANIPULATOR UTERINE 4.5 ZUMI (MISCELLANEOUS) ×2 IMPLANT
NEEDLE HYPO 21X1.5 SAFETY (NEEDLE) ×2 IMPLANT
NEEDLE SPNL 18GX3.5 QUINCKE PK (NEEDLE) IMPLANT
NEEDLE SPNL 22GX3.5 QUINCKE BK (NEEDLE) ×2 IMPLANT
NS IRRIG 1000ML POUR BTL (IV SOLUTION) ×2 IMPLANT
OBTURATOR OPTICAL STANDARD 8MM (TROCAR) ×1
OBTURATOR OPTICAL STND 8 DVNC (TROCAR) ×1
OBTURATOR OPTICALSTD 8 DVNC (TROCAR) ×1 IMPLANT
PACK LITHOTOMY IV (CUSTOM PROCEDURE TRAY) ×2 IMPLANT
PACK ROBOT GYN CUSTOM WL (TRAY / TRAY PROCEDURE) ×2 IMPLANT
PAD DRESSING TELFA 3X8 NADH (GAUZE/BANDAGES/DRESSINGS) ×1 IMPLANT
PAD OB MATERNITY 4.3X12.25 (PERSONAL CARE ITEMS) ×2 IMPLANT
PAD POSITIONING PINK XL (MISCELLANEOUS) ×2 IMPLANT
PENCIL SMOKE EVACUATOR (MISCELLANEOUS) IMPLANT
PORT ACCESS TROCAR AIRSEAL 12 (TROCAR) ×1 IMPLANT
PORT ACCESS TROCAR AIRSEAL 5M (TROCAR) ×1
POUCH SPECIMEN RETRIEVAL 10MM (ENDOMECHANICALS) IMPLANT
SCRUB EXIDINE 4% CHG 4OZ (MISCELLANEOUS) ×2 IMPLANT
SEAL CANN UNIV 5-8 DVNC XI (MISCELLANEOUS) ×4 IMPLANT
SEAL XI 5MM-8MM UNIVERSAL (MISCELLANEOUS) ×4
SET TRI-LUMEN FLTR TB AIRSEAL (TUBING) ×2 IMPLANT
SPONGE T-LAP 18X18 ~~LOC~~+RFID (SPONGE) IMPLANT
SURGIFLO W/THROMBIN 8M KIT (HEMOSTASIS) IMPLANT
SURGILUBE 2OZ TUBE FLIPTOP (MISCELLANEOUS) ×2 IMPLANT
SUT MNCRL AB 4-0 PS2 18 (SUTURE) IMPLANT
SUT PDS AB 1 TP1 96 (SUTURE) IMPLANT
SUT VIC AB 0 CT1 27 (SUTURE)
SUT VIC AB 0 CT1 27XBRD ANTBC (SUTURE) IMPLANT
SUT VIC AB 2-0 CT1 27 (SUTURE)
SUT VIC AB 2-0 CT1 TAPERPNT 27 (SUTURE) IMPLANT
SUT VIC AB 4-0 PS2 18 (SUTURE) ×4 IMPLANT
SYR 10ML LL (SYRINGE) IMPLANT
SYR BULB IRRIG 60ML STRL (SYRINGE) ×2 IMPLANT
TOWEL OR 17X26 10 PK STRL BLUE (TOWEL DISPOSABLE) ×2 IMPLANT
TOWEL OR NON WOVEN STRL DISP B (DISPOSABLE) ×2 IMPLANT
TRAP SPECIMEN MUCUS 40CC (MISCELLANEOUS) IMPLANT
TRAY FOLEY MTR SLVR 16FR STAT (SET/KITS/TRAYS/PACK) ×2 IMPLANT
TROCAR XCEL NON-BLD 5MMX100MML (ENDOMECHANICALS) IMPLANT
UNDERPAD 30X36 HEAVY ABSORB (UNDERPADS AND DIAPERS) ×4 IMPLANT
WATER STERILE IRR 1000ML POUR (IV SOLUTION) ×2 IMPLANT
YANKAUER SUCT BULB TIP 10FT TU (MISCELLANEOUS) IMPLANT

## 2021-11-12 NOTE — Anesthesia Postprocedure Evaluation (Signed)
Anesthesia Post Note  Patient: Kristin Boyd  Procedure(s) Performed: DIAGNOSTIC LAPAROSCOPY, PERITONEAL BIOPSIES ATTEMPTED HYSTEROSCOPY     Patient location during evaluation: PACU Anesthesia Type: General Level of consciousness: awake and alert Pain management: pain level controlled Vital Signs Assessment: post-procedure vital signs reviewed and stable Respiratory status: spontaneous breathing, nonlabored ventilation and respiratory function stable Cardiovascular status: blood pressure returned to baseline and stable Postop Assessment: no apparent nausea or vomiting Anesthetic complications: no   No notable events documented.  Last Vitals:  Vitals:   11/12/21 1645 11/12/21 1715  BP: (!) 156/90 (!) 182/98  Pulse: 91 90  Resp: 16 18  Temp: 36.6 C   SpO2: 91% 97%    Last Pain:  Vitals:   11/12/21 1715  TempSrc:   PainSc: 0-No pain                 Lidia Collum

## 2021-11-12 NOTE — Transfer of Care (Signed)
Immediate Anesthesia Transfer of Care Note  Patient: Kristin Boyd  Procedure(s) Performed: Procedure(s): DIAGNOSTIC LAPAROSCOPY, PERITONEAL BIOPSIES (N/A) ATTEMPTED HYSTEROSCOPY (N/A)  Patient Location: PACU  Anesthesia Type:General  Level of Consciousness:  sedated, patient cooperative and responds to stimulation  Airway & Oxygen Therapy:Patient Spontanous Breathing and Patient connected to face mask oxgen  Post-op Assessment:  Report given to PACU RN and Post -op Vital signs reviewed and stable  Post vital signs:  Reviewed and stable  Last Vitals:  Vitals:   11/12/21 0859  BP: (!) 199/99  Pulse: 99  Resp: 17  Temp: 36.9 C  SpO2: 12%    Complications: No apparent anesthesia complications

## 2021-11-12 NOTE — Anesthesia Procedure Notes (Signed)
Procedure Name: Intubation Date/Time: 11/12/2021 12:45 PM Performed by: Lavina Hamman, CRNA Pre-anesthesia Checklist: Patient identified, Emergency Drugs available, Suction available, Patient being monitored and Timeout performed Patient Re-evaluated:Patient Re-evaluated prior to induction Oxygen Delivery Method: Circle system utilized Preoxygenation: Pre-oxygenation with 100% oxygen Induction Type: IV induction Ventilation: Mask ventilation without difficulty Laryngoscope Size: Mac and 3 Grade View: Grade I Tube type: Oral Tube size: 7.0 mm Number of attempts: 1 Airway Equipment and Method: Stylet Placement Confirmation: ETT inserted through vocal cords under direct vision, positive ETCO2, CO2 detector and breath sounds checked- equal and bilateral Secured at: 21 cm Tube secured with: Tape Dental Injury: Teeth and Oropharynx as per pre-operative assessment  Comments: ATOI.

## 2021-11-12 NOTE — Interval H&P Note (Signed)
History and Physical Interval Note:  11/12/2021 11:31 AM  Kristin Boyd  has presented today for surgery, with the diagnosis of ADNEXAL MASS.  The various methods of treatment have been discussed with the patient and family. After consideration of risks, benefits and other options for treatment, the patient has consented to  Procedure(s): XI ROBOTIC ASSISTED  UNILATERAL OOPHERECTOMY; BILATERAL SALPINGECTOMY (N/A) DILATATION AND CURETTAGE OF UTERUS (N/A) XI ROBOTIC ASSISTED TOTAL HYSTERECTOMY WITH BILATERAL SALPINGO OOPHORECTOMY; POSSIBLE STAGING;POSSIBLE LAPAROTOMY (N/A) as a surgical intervention.  The patient's history has been reviewed, patient examined, no change in status, stable for surgery.  I have reviewed the patient's chart and labs.  Questions were answered to the patient's satisfaction.     Lafonda Mosses

## 2021-11-12 NOTE — Op Note (Signed)
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 Obesity made retroperitoneal visualization limited and increased the complexity of the case and necessitated additional instrumentation for retraction. Obesity related complexity increased the duration of the procedure by 25 minutes.   Surgeon: Jeral Pinch MD  Assistant Surgeon: Lahoma Crocker MD (an MD assistant was necessary for tissue manipulation, management of robotic instrumentation, retraction and positioning due to the complexity of the case and hospital policies).   Anesthesia: GET  Urine Output: 300  Operative Findings:  On EUA, moderately mobile uterus, rectum free posteriorly.  On intra-abdominal entry, powder burn lesions noted on right diaphragm consistent with endometriosis.  Omentum also with evidence of endometriosis.  Omentum adherent to the anterior abdominal wall.  Sigmoid colon mucosa with evidence of endometriosis implants.  Sigmoid draped over left adnexa which is not visible.  Pelvic sidewall peritoneum with endometriosis implants.  Anterior uterus visible in the pelvis, deviated to the right, right adnexa not visible.  Minimal intra-abdominal fluid noted.  Cervix unable to be dilated due to resistance met at approximately 4 cm.  Hysteroscope attempted with thick adhesions noted at what is suspected to be the internal cervical os, consistent with patient's prior endometrial ablation.  Estimated Blood Loss:  less than 50 mL      Total IV Fluids: see I&O flowsheet         Specimens: peritoneal  biopsy         Complications:  None apparent; patient tolerated the procedure well.         Disposition: PACU - hemodynamically stable.  Procedure Details  The patient was seen in the Holding Room. The risks, benefits, complications, treatment options, and expected outcomes were discussed with the patient.  The patient concurred with the proposed plan, giving informed consent.  The site of surgery properly noted/marked. The patient was identified as Kristin Boyd and the procedure verified as a Robotic-assisted bilateral unilateral oophorectomy, bilateral salpingectomy, D&C, any other indicated procedures.   After induction of anesthesia, the patient was draped and prepped in the usual sterile manner. Patient was placed in supine position after anesthesia and draped and prepped in the usual sterile manner as follows: Her arms were tucked to her side with all appropriate precautions.  The shoulders were stabilized with padded shoulder blocks applied to the acromium processes.  The patient was placed in the semi-lithotomy position in [place].  The perineum and vagina were prepped with CholoraPrep. The patient was draped after the CholoraPrep had been allowed to dry for 3 minutes.  A Time Out was held and the above information confirmed.  The urethra was prepped with Betadine. Foley catheter was placed.  A sterile speculum was placed in the vagina.  The cervix was grasped with a single-tooth tenaculum.  Attempt was made to dilate the cervix, but given resistance met, decision made to proceed with dilation under laparoscopic guidance.  OG tube placement was confirmed and to suction.   Next, a 10 mm skin incision was made 1 cm below the subcostal margin in the midclavicular line.  The 5 mm Optiview port and scope was used for direct entry.  Opening pressure was under 10 mm CO2.  The abdomen was insufflated and  the findings were noted as above.   At this point and all points during the procedure,  the patient's intra-abdominal pressure did not exceed 15 mmHg.  Abdomen and pelvis were examined with findings noted above.  Pelvic washings were collected.  Next, 2 8 mm skin incisions were made in the left mid abdomen under direct visualization after injection of local anesthesia.  Using graspers and monopolar cautery with scissors, peritoneal biopsy was taken along the right sidewall just above the pelvic brim.  This was handed off the field to be sent to pathology.  Attention was then made to dilate the cervix again but given resistance, decision made to perform hysteroscopy.  Under hysteroscopic guidance, after a speculum had been replaced in the vagina and a tenaculum placed on the anterior lip of the cervix, attempt was made to cannulate the internal os of the cervix with the hysteroscope.  Dense adhesions were noted and given concern for the possibility of perforation, hysteroscopy was abandoned.  Fluid deficit was measured at 150 cc.  Surgeons gloves were then changed.  At this point in the procedure was completed.  Abdomen was desufflated.  Robotic trochars and 5 mm trocar was removed. The subcuticular tissue was closed with 4-0 Vicryl and the skin was closed with 4-0 Monocryl in a subcuticular manner.  Dermabond was applied.    The vagina was swabbed with minimal bleeding noted. Foley catheter was removed.  All sponge, lap and needle counts were correct x  3.   The patient was transferred to the recovery room in stable condition.  Jeral Pinch, MD

## 2021-11-12 NOTE — Anesthesia Preprocedure Evaluation (Signed)
Anesthesia Evaluation  Patient identified by MRN, date of birth, ID band Patient awake    Reviewed: Allergy & Precautions, NPO status , Patient's Chart, lab work & pertinent test results  History of Anesthesia Complications Negative for: history of anesthetic complications  Airway Mallampati: IV  TM Distance: >3 FB Neck ROM: Full    Dental  (+) Teeth Intact, Dental Advisory Given   Pulmonary neg pulmonary ROS, former smoker,    Pulmonary exam normal        Cardiovascular negative cardio ROS Normal cardiovascular exam     Neuro/Psych negative neurological ROS     GI/Hepatic negative GI ROS, Neg liver ROS,   Endo/Other  Morbid obesity  Renal/GU negative Renal ROS  negative genitourinary   Musculoskeletal negative musculoskeletal ROS (+)   Abdominal   Peds  Hematology negative hematology ROS (+)   Anesthesia Other Findings   Reproductive/Obstetrics Adnexal mass                            Anesthesia Physical Anesthesia Plan  ASA: 3  Anesthesia Plan: General   Post-op Pain Management: Toradol IV (intra-op) and Tylenol PO (pre-op)   Induction: Intravenous  PONV Risk Score and Plan: 4 or greater and Ondansetron, Dexamethasone, Treatment may vary due to age or medical condition and Midazolam  Airway Management Planned: Oral ETT and Video Laryngoscope Planned  Additional Equipment: None  Intra-op Plan:   Post-operative Plan: Extubation in OR  Informed Consent: I have reviewed the patients History and Physical, chart, labs and discussed the procedure including the risks, benefits and alternatives for the proposed anesthesia with the patient or authorized representative who has indicated his/her understanding and acceptance.     Dental advisory given  Plan Discussed with:   Anesthesia Plan Comments:         Anesthesia Quick Evaluation

## 2021-11-12 NOTE — Discharge Instructions (Addendum)
  11/13/2021  Return to work: 2-4 weeks if applicable  Activity: 1. Be up and out of the bed during the day.  Take a nap if needed.  You may walk up steps but be careful and use the hand rail.  Stair climbing will tire you more than you think, you may need to stop part way and rest.   2. No lifting or straining over 10 lbs, pushing, pulling, straining for 6 weeks.  3. No driving for 1 week(s).  Do not drive if you are taking narcotic pain medicine. You need to make sure your reaction time has returned and you can brake safely.  4. Shower daily.  Use your regular soap to bathe and when finished pat your incision dry; don't rub.  No tub baths until cleared by your surgeon.   5. No sexual activity and nothing in the vagina for 2-4 weeks.  6. You may experience a small amount of clear drainage from your incisions, which is normal.  If the drainage persists or increases, please call the office.  7. Take Tylenol or ibuprofen (Take these regularly (every 6 hours) to decrease the build up of pain) first for pain and only use narcotic pain medication for severe pain not relieved by the Tylenol or Ibuprofen.  Monitor your Tylenol intake to a max of 4,000 mg.   - If necessary, for severe pain not relieved by ibuprofen, take oxycodone.  - While taking oxycodone, you should take sennakot every night to reduce the likelihood of constipation. If this causes diarrhea, stop its use.  Diet: 1. Low sodium Heart Healthy Diet is recommended.  2. It is safe to use a laxative, such as Miralax or Colace, if you have difficulty moving your bowels. You can take Sennakot at bedtime every evening to keep bowel movements regular and to prevent constipation.    Wound Care: 1. Keep clean and dry.  Shower daily.  Reasons to call the Doctor: Fever - Oral temperature greater than 100.4 degrees Fahrenheit Foul-smelling vaginal discharge Difficulty urinating Nausea and vomiting Increased pain at the site of the  incision that is unrelieved with pain medicine. Difficulty breathing with or without chest pain New calf pain especially if only on one side Sudden, continuing increased vaginal bleeding with or without clots.   Contacts: For questions or concerns you should contact:  Dr. Jeral Pinch at [phone]  Joylene John, NP at [phone]    Follow-up: 1. See Jeral Pinch on 12/14 at 4 pm at the [facility].   Contacts: For questions or concerns you should contact:  After hours and on week-ends call [phone] and ask to speak to the physician on call for Gynecologic Oncology

## 2021-11-12 NOTE — Brief Op Note (Signed)
11/12/2021  2:54 PM  PATIENT:  Kristin Boyd  40 y.o. female  PRE-OPERATIVE DIAGNOSIS:  ADNEXAL MASS  POST-OPERATIVE DIAGNOSIS:  ADNEXAL MASS  PROCEDURE:  Procedure(s): DIAGNOSTIC LAPAROSCOPY, PERITONEAL BIOPSIES (N/A) ATTEMPTED HYSTEROSCOPY (N/A)  SURGEON:  Surgeon(s) and Role:    Lafonda Mosses, MD - Primary    * Lahoma Crocker, MD - Assisting  EBL:  25 mL   BLOOD ADMINISTERED:none  DRAINS: none   LOCAL MEDICATIONS USED:  MARCAINE     SPECIMEN:  peritoneal biopsy  DISPOSITION OF SPECIMEN:  PATHOLOGY  COUNTS:  YES  TOURNIQUET:  * No tourniquets in log *  DICTATION: .Note written in EPIC  PLAN OF CARE: Discharge to home after PACU  PATIENT DISPOSITION:  PACU - hemodynamically stable.   Delay start of Pharmacological VTE agent (>24hrs) due to surgical blood loss or risk of bleeding: not applicable

## 2021-11-13 ENCOUNTER — Other Ambulatory Visit: Payer: Self-pay | Admitting: Gynecologic Oncology

## 2021-11-13 ENCOUNTER — Encounter (HOSPITAL_COMMUNITY): Payer: Self-pay | Admitting: Gynecologic Oncology

## 2021-11-13 DIAGNOSIS — N809 Endometriosis, unspecified: Secondary | ICD-10-CM

## 2021-11-13 DIAGNOSIS — N9489 Other specified conditions associated with female genital organs and menstrual cycle: Secondary | ICD-10-CM

## 2021-11-13 DIAGNOSIS — N803 Endometriosis of pelvic peritoneum, unspecified: Secondary | ICD-10-CM | POA: Diagnosis not present

## 2021-11-13 LAB — CYTOLOGY - NON PAP

## 2021-11-13 LAB — SURGICAL PATHOLOGY

## 2021-11-13 MED ORDER — OXYCODONE HCL 5 MG PO TABS: 5.0000 mg | ORAL_TABLET | Freq: Four times a day (QID) | ORAL | 0 refills | Status: DC | PRN

## 2021-11-13 NOTE — Progress Notes (Signed)
GYN Oncology Progress Note  Message sent about an incision that had opened with bleeding from RN. Upon arrival, patient alert, oriented, in no acute distress. Tolerated breakfast this am with no issues. The lower left abdominal incision had minimal amount of bright red bleeding with no dermabond remaining on the incision itself. No significant bleeding noted. Dermabond applied to the incision. Incision approximated and remained closed after application. A dry gauze dressing with a tegaderm was applied. Patient instructed to remove this tomorrow. She states she only has 2-3 oxycodone left at home. She has not picked up the sennakot and stressed the importance of this along with alternating tylenol and ibuprofen. A refill will be sent to her pharmacy for additional oxycodone if needed.

## 2021-11-13 NOTE — Progress Notes (Signed)
Lupron injection treatment plan ordered per Dr. Berline Lopes. This will be further discussed with the patient at her return visit. Sending to authorization department.

## 2021-11-13 NOTE — Discharge Summary (Addendum)
Physician Discharge Summary  Patient ID: Kristin Boyd MRN: [id number] DOB/AGE: August 28, 1981 40 y.o.  Admit date: 11/12/2021 Discharge date: 11/13/2021  Admission Diagnoses: Postoperative state  Discharge Diagnoses:  Principal Problem:   Postoperative state Active Problems:   Moderate endometriosis  Discharged Condition:  The patient is in good condition and stable for discharge.    Hospital Course: On 11/12/2021, the patient underwent the following: Procedure(s): DIAGNOSTIC LAPAROSCOPY, PERITONEAL BIOPSIES ATTEMPTED HYSTEROSCOPY. The postoperative course was uneventful.  Due to transportation issues (pt did not have a ride home), the patient stayed overnight. She was discharged to home on postoperative day 1 tolerating a regular diet, ambulating, voiding, pain controlled.   Consults:  None  Significant Diagnostic Studies: None  Treatments: surgery: see above  Discharge Exam: Blood pressure 120/68, pulse 77, temperature 98.4 F (36.9 C), temperature source Oral, resp. rate 18, height 5\' 9"  (1.753 m), weight (!) 353 lb 9.9 oz (160.4 kg), SpO2 96 %. General appearance: cooperative, appears stated age, no distress, and appears sleepy Resp: clear to auscultation bilaterally Cardio: regular rate and rhythm, S1, S2 normal, no murmur, click, rub or gallop GI: soft, non-tender; bowel sounds normal; no masses,  no organomegaly Extremities: extremities normal, atraumatic, no cyanosis or edema Incision/Wound: lap sites to the abdomen with dermabond intact without erythema or drainage  Disposition: Discharge disposition: 01-Home or Self Care      Discharge Instructions     Call MD for:  difficulty breathing, headache or visual disturbances   Complete by: As directed    Call MD for:  difficulty breathing, headache or visual disturbances   Complete by: As directed    Call MD for:  extreme fatigue   Complete by: As directed    Call MD for:  extreme fatigue   Complete by: As  directed    Call MD for:  hives   Complete by: As directed    Call MD for:  persistant dizziness or light-headedness   Complete by: As directed    Call MD for:  persistant dizziness or light-headedness   Complete by: As directed    Call MD for:  persistant nausea and vomiting   Complete by: As directed    Call MD for:  persistant nausea and vomiting   Complete by: As directed    Call MD for:  redness, tenderness, or signs of infection (pain, swelling, redness, odor or green/yellow discharge around incision site)   Complete by: As directed    Call MD for:  redness, tenderness, or signs of infection (pain, swelling, redness, odor or green/yellow discharge around incision site)   Complete by: As directed    Call MD for:  severe uncontrolled pain   Complete by: As directed    Call MD for:  severe uncontrolled pain   Complete by: As directed    Call MD for:  temperature >100.4   Complete by: As directed    Call MD for:  temperature >100.4   Complete by: As directed    Diet - low sodium heart healthy   Complete by: As directed    Diet general   Complete by: As directed    Driving Restrictions   Complete by: As directed    No driving for 1 week if you were cleared to drive before surgery.  Do not take narcotics and drive. You need to make sure your reaction time has returned.   Increase activity slowly   Complete by: As directed    Lifting restrictions  Complete by: As directed    No lifting greater than 10 lbs, pushing, pulling, straining for 6 weeks.   Sexual Activity Restrictions   Complete by: As directed    No sexual activity, nothing in the vagina, for 4 weeks.      Allergies as of 11/13/2021       Reactions   Peanut (diagnostic) Anaphylaxis   Penicillins Anaphylaxis   Has patient had a PCN reaction causing immediate rash, facial/tongue/throat swelling, SOB or lightheadedness with hypotension: Yes Has patient had a PCN reaction causing severe rash involving mucus  membranes or skin necrosis: Unknown Has patient had a PCN reaction that required hospitalization: Yes Has patient had a PCN reaction occurring within the last 10 years: Yes If all of the above answers are "NO", then may proceed with Cephalosporin use.        Medication List     TAKE these medications    acetaminophen 500 MG tablet Commonly known as: TYLENOL Take 1,000 mg by mouth every 6 (six) hours as needed for moderate pain.   ibuprofen 600 MG tablet Commonly known as: ADVIL Take 1 tablet (600 mg total) by mouth every 6 (six) hours as needed for moderate pain. For AFTER surgery only   oxyCODONE 5 MG immediate release tablet Commonly known as: Oxy IR/ROXICODONE Take 1 tablet (5 mg total) by mouth every 6 (six) hours as needed for severe pain. To be used for before surgery for acute abdominal pain, do not take and drive   senna-docusate 8.6-50 MG tablet Commonly known as: Senokot-S Take 2 tablets by mouth at bedtime. For AFTER surgery, do not take if having diarrhea        Follow-up Information     Lafonda Mosses, MD Follow up on 11/20/2021.   Specialty: Gynecologic Oncology Why: at 4pm at the [facility] for post-op follow up Contact information: [address] [place] [address] [phone]                 Greater than thirty minutes were spend for face to face discharge instructions and discharge orders/summary in EPIC.   Signed: Dorothyann Gibbs 11/13/2021, 10:11 AM

## 2021-11-14 ENCOUNTER — Encounter: Payer: Self-pay | Admitting: Gynecologic Oncology

## 2021-11-14 ENCOUNTER — Telehealth: Payer: Self-pay

## 2021-11-14 ENCOUNTER — Telehealth: Payer: Self-pay | Admitting: Gynecologic Oncology

## 2021-11-14 NOTE — Telephone Encounter (Signed)
Spoke with Kristin Boyd this morning. She states she is eating, drinking and urinating well. She has had loose stools this morning and denies taking senokot. She denies fever or chills. Incisions are dry and intact. Patient has not removed her dressing from left lower incision yet. She states she will remove this later when she gets up. Encouraged patient to be up and moving to prevent blood clots after surgery. Patient reports her pain as 8/10. Pain started this morning, is constant, she feels like she has been "punched in the stomach". Pain is in her mid/left abdomen. She states she has been alternating tylenol and ibuprofen as well as taking oxycodone and her pain is still an 8/10. She denies any lifting or activity prior to onset of pain.   Patient inquiring what exactly was done in her surgery?  Instructed patient that Dr. Berline Lopes will be notified and someone from our office will follow up with her today. Patient verbalized understanding.

## 2021-11-14 NOTE — Telephone Encounter (Signed)
Called patient and discussed findings from surgery. Reviewed extensive endometriosis, biopsy confirmed. Discussed why planned surgery was not performed. Discussed importance of hormonal treatment.  Jeral Pinch MD Gynecologic Oncology

## 2021-11-19 ENCOUNTER — Encounter: Payer: Self-pay | Admitting: Gynecologic Oncology

## 2021-11-20 ENCOUNTER — Other Ambulatory Visit: Payer: Self-pay

## 2021-11-20 ENCOUNTER — Encounter: Payer: Self-pay | Admitting: Gynecologic Oncology

## 2021-11-20 ENCOUNTER — Inpatient Hospital Stay: Payer: 59 | Attending: Gynecologic Oncology | Admitting: Gynecologic Oncology

## 2021-11-20 ENCOUNTER — Other Ambulatory Visit: Payer: Self-pay | Admitting: Gynecologic Oncology

## 2021-11-20 ENCOUNTER — Telehealth: Payer: Self-pay

## 2021-11-20 VITALS — BP 179/89 | HR 80 | Temp 99.3°F | Resp 18 | Ht 69.0 in | Wt 357.0 lb

## 2021-11-20 DIAGNOSIS — N809 Endometriosis, unspecified: Secondary | ICD-10-CM

## 2021-11-20 DIAGNOSIS — Z7189 Other specified counseling: Secondary | ICD-10-CM

## 2021-11-20 DIAGNOSIS — Z9889 Other specified postprocedural states: Secondary | ICD-10-CM

## 2021-11-20 DIAGNOSIS — Z6841 Body Mass Index (BMI) 40.0 and over, adult: Secondary | ICD-10-CM

## 2021-11-20 DIAGNOSIS — N9489 Other specified conditions associated with female genital organs and menstrual cycle: Secondary | ICD-10-CM

## 2021-11-20 MED ORDER — NORETHINDRONE ACETATE 5 MG PO TABS: 5.0000 mg | ORAL_TABLET | Freq: Every day | ORAL | 6 refills | Status: DC

## 2021-11-20 MED ORDER — ORILISSA 150 MG PO TABS: 1.0000 | ORAL_TABLET | Freq: Every day | ORAL | 6 refills | Status: DC

## 2021-11-20 NOTE — Telephone Encounter (Signed)
Received called from Ms. Hult. She states she tried to fill her prescription for Orilissa. With her insurance it was cost her $1,000 per month. She is inquiring if there is an alternate medication she can take.  Instructed patient that MD will be notified and someone from the office will be in touch with her. If we do not follow up with her today, we will call her tomorrow 11/21/21. Patient verbalized understanding.

## 2021-11-20 NOTE — Patient Instructions (Addendum)
Plan to begin taking Orilissa once daily. See other handouts about endometriosis. Plan to follow up in three months or sooner if needed.

## 2021-11-20 NOTE — Telephone Encounter (Signed)
Please let the patient know that I have sent a different medication to her pharmacy and canceled the Orilissa. Medication is norethindrone.

## 2021-11-20 NOTE — Progress Notes (Signed)
Gynecologic Oncology Return Clinic Visit  11/20/2021  Reason for Visit: Follow-up for surgery, discussion of surgical findings, treatment planning  Treatment History: 11/12/2021: Diagnostic surgery for a complex adnexal mass and thickened endometrium.  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  Interval History: Patient presents today for follow-up.  Overall doing well from surgery.  Denies significant pain related to her incisions.  Reports regular bowel and bladder function.  Did not need to use any medication to help with her bowel movements.  Denies any vaginal bleeding or discharge.  Past Medical/Surgical History: Past Medical History:  Diagnosis Date   Arthritis    Chronic knee pain    Chronic pelvic pain in female    Obese    Ovarian cyst    Pneumonia due to COVID-19 virus 05/26/2019    Past Surgical History:  Procedure Laterality Date   CESAREAN SECTION     CHOLECYSTECTOMY     DILATION AND CURETTAGE OF UTERUS N/A 11/12/2021   Procedure: ATTEMPTED HYSTEROSCOPY;  Surgeon: Lafonda Mosses, MD;  Location: [facility];  Service: Gynecology;  Laterality: N/A;   ENDOMETRIAL ABLATION     WISDOM TOOTH EXTRACTION     2 removed   XI ROBOTIC ASSISTED SALPINGECTOMY N/A 11/12/2021   Procedure: DIAGNOSTIC LAPAROSCOPY, PERITONEAL BIOPSIES;  Surgeon: Lafonda Mosses, MD;  Location: [facility];  Service: Gynecology;  Laterality: N/A;    Family History  Problem Relation Age of Onset   Colon cancer Neg Hx    Breast cancer Neg Hx    Ovarian cancer Neg Hx    Endometrial cancer Neg Hx    Pancreatic cancer Neg Hx    Prostate cancer Neg Hx     Social History   Socioeconomic History   Marital status: Single    Spouse name: Not on file   Number of children: Not on file   Years of education: Not on file   Highest education level: Not on file  Occupational History    Not on file  Tobacco Use   Smoking status: Former   Smokeless tobacco: Never  Vaping Use   Vaping Use: Never used  Substance and Sexual Activity   Alcohol use: Yes    Comment: occasional   Drug use: Never   Sexual activity: Not on file  Other Topics Concern   Not on file  Social History Narrative   Not on file   Social Determinants of Health   Financial Resource Strain: Not on file  Food Insecurity: Not on file  Transportation Needs: Not on file  Physical Activity: Not on file  Stress: Not on file  Social Connections: Not on file    Current Medications:  Current Outpatient Medications:    acetaminophen (TYLENOL) 500 MG tablet, Take 1,000 mg by mouth every 6 (six) hours as needed for moderate pain., Disp: , Rfl:    ibuprofen (ADVIL) 600 MG tablet, Take 1 tablet (600 mg total) by mouth every 6 (six) hours as needed for moderate pain. For AFTER surgery only, Disp: 30 tablet, Rfl: 0   oxyCODONE (OXY IR/ROXICODONE) 5 MG immediate release tablet, Take 1 tablet (5 mg total) by mouth every 6 (six) hours as needed for severe pain. For AFTER surgery only, do not take and drive, Disp: 10 tablet, Rfl: 0   norethindrone (AYGESTIN) 5 MG tablet, Take 1 tablet (5 mg total) by mouth daily., Disp: 30 tablet, Rfl: 6   senna-docusate (  SENOKOT-S) 8.6-50 MG tablet, Take 2 tablets by mouth at bedtime. For AFTER surgery, do not take if having diarrhea (Patient not taking: Reported on 11/19/2021), Disp: 30 tablet, Rfl: 0  Review of Systems: Denies appetite changes, fevers, chills, fatigue, unexplained weight changes. Denies hearing loss, neck lumps or masses, mouth sores, ringing in ears or voice changes. Denies cough or wheezing.  Denies shortness of breath. Denies chest pain or palpitations. Denies leg swelling. Denies abdominal distention, pain, blood in stools, constipation, diarrhea, nausea, vomiting, or early satiety. Denies pain with intercourse, dysuria, frequency, hematuria or  incontinence. Denies hot flashes, pelvic pain, vaginal bleeding or vaginal discharge.   Denies joint pain, back pain or muscle pain/cramps. Denies itching, rash, or wounds. Denies dizziness, headaches, numbness or seizures. Denies swollen lymph nodes or glands, denies easy bruising or bleeding. Denies anxiety, depression, confusion, or decreased concentration.  Physical Exam: BP (!) 179/89 (BP Location: Left Arm, Patient Position: Sitting)    Pulse 80    Temp 99.3 F (37.4 C) (Oral)    Resp 18    Ht 5\' 9"  (1.753 m)    Wt (!) 357 lb (161.9 kg)    SpO2 100%    BMI 52.72 kg/m  General: Alert, oriented, no acute distress. HEENT: Normocephalic, atraumatic, sclera anicteric. Chest: Unlabored breathing on room air. Abdomen: Obese, soft, nontender.  Normoactive bowel sounds.  No masses or hepatosplenomegaly appreciated.  Well-healing incisions.  Laboratory & Radiologic Studies: None new  Assessment & Plan: Kristin Boyd is a 40 y.o. woman with Stage 4 endometriosis.  Patient is doing well from a postoperative standpoint and meeting milestones.  Discussed continued postoperative expectations and restrictions.  Reviewed in detail with the patient findings from surgery.  She was given a copy of the pictures that I took and we discussed various findings related to her endometriosis.  She was also given a copy of her pathology report which confirms endometriosis.  She has not had any medical management for her symptoms.  Given diagnosis of endometriosis, we discussed that first-line management is with hormonal therapy/medication therapy.  Patient was given several handouts on endometriosis and she and I reviewed today again, as we had by phone, what this diagnosis means and what symptoms patients can have related to it.  Given her extensive disease, we discussed that surgical management could include the need for bowel resection in the future.  Especially in the setting of her comorbidities, notably  her BMI more than 50, this would be quite challenging and should be avoided if we can control her symptoms hormonally.  From a symptom standpoint, I would like to get her started on hormonal suppression as soon as possible.  I had hoped that we could give her Lupron for 6 months, but this medication will be likely almost $2000 an injection even with her insurance.  I would like to send a prescription in for Freida Busman to her pharmacy.  I have asked her to call me if this prescription is expensive.  If it is, then we will use a progesterone only (given her BMI and age) pill to see if we can help control her symptoms.  I will plan to see her for follow-up and review of symptoms in 3 months.  32 minutes of total time was spent for this patient encounter, including preparation, face-to-face counseling with the patient and coordination of care, and documentation of the encounter.  Jeral Pinch, MD  Division of Gynecologic Oncology  Department of Obstetrics and Gynecology  [facility]  of [facility]

## 2021-11-21 ENCOUNTER — Telehealth: Payer: Self-pay

## 2021-11-21 NOTE — Telephone Encounter (Signed)
Spoke with Ms. Kristin Boyd and notified her that [place] prescription has been cancelled and a new prescription for norethindrone has been sent to pharmacy. Patient verbalized understanding and is very appreciative of assistance. Patient states norethindrone will cost her around $13.00 per month. Instructed patient to call with any needs.

## 2021-11-29 ENCOUNTER — Encounter: Payer: 59 | Admitting: Gynecologic Oncology

## 2021-12-06 ENCOUNTER — Encounter: Payer: 59 | Admitting: Gynecologic Oncology

## 2021-12-23 ENCOUNTER — Encounter: Payer: Self-pay | Admitting: Gynecologic Oncology

## 2021-12-24 ENCOUNTER — Encounter: Payer: Self-pay | Admitting: Gynecologic Oncology

## 2021-12-24 ENCOUNTER — Telehealth: Payer: Self-pay

## 2021-12-24 NOTE — Telephone Encounter (Signed)
Called patient regarding her pain.  Dr. Berline Lopes has recommended trying Amitriptyline 10 mg at bedtime to see if this will help with pain.  Patient said that she doesn't like taking antidepressants because it makes her feel weird. She stated that she took antidepressants when she was 15. Patient stated that she wanted to think about it and will give [place] a call back with what she wants to do.   I spoke with her about a referral to an endometriosis specialist and she asked what the difference between Dr. Berline Lopes and a specialist was and I told her that an endometriosis specialist specializes in endometriosis. Patient verbalized understanding.

## 2021-12-26 ENCOUNTER — Emergency Department (HOSPITAL_COMMUNITY)
Admission: EM | Admit: 2021-12-26 | Discharge: 2021-12-26 | Disposition: A | Discharge status: Home / Self Care | Payer: 59 | Attending: Student | Admitting: Student

## 2021-12-26 ENCOUNTER — Telehealth: Payer: Self-pay

## 2021-12-26 ENCOUNTER — Other Ambulatory Visit: Payer: Self-pay

## 2021-12-26 ENCOUNTER — Encounter (HOSPITAL_COMMUNITY): Payer: Self-pay

## 2021-12-26 ENCOUNTER — Encounter: Payer: Self-pay | Admitting: Gynecologic Oncology

## 2021-12-26 DIAGNOSIS — R1084 Generalized abdominal pain: Secondary | ICD-10-CM | POA: Insufficient documentation

## 2021-12-26 DIAGNOSIS — Z5321 Procedure and treatment not carried out due to patient leaving prior to being seen by health care provider: Secondary | ICD-10-CM | POA: Insufficient documentation

## 2021-12-26 DIAGNOSIS — R509 Fever, unspecified: Secondary | ICD-10-CM | POA: Insufficient documentation

## 2021-12-26 LAB — CBC WITH DIFFERENTIAL/PLATELET
Abs Immature Granulocytes: 0.06 10*3/uL (ref 0.00–0.07)
Basophils Absolute: 0.1 10*3/uL (ref 0.0–0.1)
Basophils Relative: 1 %
Eosinophils Absolute: 0.1 10*3/uL (ref 0.0–0.5)
Eosinophils Relative: 1 %
HCT: 41.3 % (ref 36.0–46.0)
Hemoglobin: 13.2 g/dL (ref 12.0–15.0)
Immature Granulocytes: 0 %
Lymphocytes Relative: 27 %
Lymphs Abs: 3.9 10*3/uL (ref 0.7–4.0)
MCH: 26.1 pg (ref 26.0–34.0)
MCHC: 32 g/dL (ref 30.0–36.0)
MCV: 81.8 fL (ref 80.0–100.0)
Monocytes Absolute: 0.9 10*3/uL (ref 0.1–1.0)
Monocytes Relative: 6 %
Neutro Abs: 9.3 10*3/uL — ABNORMAL HIGH (ref 1.7–7.7)
Neutrophils Relative %: 65 %
Platelets: 325 10*3/uL (ref 150–400)
RBC: 5.05 MIL/uL (ref 3.87–5.11)
RDW: 15.5 % (ref 11.5–15.5)
WBC: 14.3 10*3/uL — ABNORMAL HIGH (ref 4.0–10.5)
nRBC: 0 % (ref 0.0–0.2)

## 2021-12-26 LAB — COMPREHENSIVE METABOLIC PANEL
ALT: 14 U/L (ref 0–44)
AST: 16 U/L (ref 15–41)
Albumin: 4 g/dL (ref 3.5–5.0)
Alkaline Phosphatase: 55 U/L (ref 38–126)
Anion gap: 9 (ref 5–15)
BUN: 11 mg/dL (ref 6–20)
CO2: 18 mmol/L — ABNORMAL LOW (ref 22–32)
Calcium: 8.7 mg/dL — ABNORMAL LOW (ref 8.9–10.3)
Chloride: 109 mmol/L (ref 98–111)
Creatinine, Ser: 0.93 mg/dL (ref 0.44–1.00)
GFR, Estimated: >60 mL/min (ref >60)
Glucose, Bld: 86 mg/dL (ref 70–99)
Potassium: 4 mmol/L (ref 3.5–5.1)
Sodium: 136 mmol/L (ref 135–145)
Total Bilirubin: 0.8 mg/dL (ref 0.3–1.2)
Total Protein: 7.8 g/dL (ref 6.5–8.1)

## 2021-12-26 LAB — LIPASE, BLOOD: Lipase: 31 U/L (ref 11–51)

## 2021-12-26 LAB — I-STAT BETA HCG BLOOD, ED (MC, WL, AP ONLY): I-stat hCG, quantitative: <5 m[IU]/mL (ref <5)

## 2021-12-26 MED ORDER — OXYCODONE-ACETAMINOPHEN 5-325 MG PO TABS
1.0000 | ORAL_TABLET | Freq: Once | ORAL | Status: AC
  Administered 2021-12-26: 1 via ORAL
  Filled 2021-12-26: qty 1

## 2021-12-26 MED ORDER — ONDANSETRON 4 MG PO TBDP
8.0000 mg | ORAL_TABLET | Freq: Once | ORAL | Status: AC
  Administered 2021-12-26: 8 mg via ORAL
  Filled 2021-12-26: qty 2

## 2021-12-26 NOTE — Telephone Encounter (Signed)
Patient called requesting refill for Oxycodone.  She stated that Ibuprofen is not working.  She stated that she wants something stronger than Oxycodone.  Will return call once spoken to [facility], NP.

## 2021-12-26 NOTE — ED Notes (Signed)
Pt stated she called a ride home and could not wait any longer

## 2021-12-26 NOTE — ED Provider Triage Note (Signed)
Emergency Medicine Provider Triage Evaluation Note  Kristin Boyd , a 41 y.o. female  was evaluated in triage.  Pt complains of diffuse abdominal pain starting this morning.  No nausea or vomiting, no vaginal bleeding.  History of endometriosis and left adnexal cyst, being followed by gynecology with plans for total hysterectomy.  Patient has tried ibuprofen 800 twice without any relief of her symptoms..  Review of Systems  Positive: Abdominal pain, fever Negative: V  Physical Exam  BP (!) 186/100 (BP Location: Left Arm)    Pulse 78    Temp 99.6 F (37.6 C) (Oral)    Resp (!) 24    SpO2 100%  Gen:   Awake, uncomfortable appearing Resp:  Normal effort  MSK:   Moves extremities without difficulty  Other:  Diffuse abdominal tenderness  Medical Decision Making  Medically screening exam initiated at 6:04 PM.  Appropriate orders placed.  Saniyya Bushee was informed that the remainder of the evaluation will be completed by another provider, this initial triage assessment does not replace that evaluation, and the importance of remaining in the ED until their evaluation is complete.  Labs, urine to start.  Last CT and pelvic ultrasound were performed early November 2022.   Sherrill Raring, PA-C 12/26/21 1805

## 2021-12-26 NOTE — Telephone Encounter (Signed)
Referral successfully faxed to [facility]nec[facility] (Dr. Elizabeth Sauer).

## 2021-12-26 NOTE — ED Triage Notes (Signed)
Pt BIB [facility] from home c/o abdominal pain and back pain. Pt has a hx of endometriosis and a left ovarian cyst. Pt states the pain started this morning but has gotten worse.

## 2021-12-27 ENCOUNTER — Encounter: Payer: Self-pay | Admitting: Gynecologic Oncology

## 2021-12-27 ENCOUNTER — Telehealth: Payer: Self-pay

## 2021-12-27 ENCOUNTER — Emergency Department (HOSPITAL_COMMUNITY)
Admission: EM | Admit: 2021-12-27 | Discharge: 2021-12-27 | Disposition: A | Discharge status: Home / Self Care | Payer: 59 | Attending: Emergency Medicine | Admitting: Emergency Medicine

## 2021-12-27 ENCOUNTER — Encounter (HOSPITAL_COMMUNITY): Payer: Self-pay

## 2021-12-27 ENCOUNTER — Other Ambulatory Visit: Payer: Self-pay

## 2021-12-27 DIAGNOSIS — G8929 Other chronic pain: Secondary | ICD-10-CM | POA: Diagnosis not present

## 2021-12-27 DIAGNOSIS — R109 Unspecified abdominal pain: Secondary | ICD-10-CM | POA: Diagnosis present

## 2021-12-27 LAB — URINALYSIS, ROUTINE W REFLEX MICROSCOPIC
Bilirubin Urine: NEGATIVE
Glucose, UA: NEGATIVE mg/dL
Hgb urine dipstick: NEGATIVE
Ketones, ur: 20 mg/dL — AB
Leukocytes,Ua: NEGATIVE
Nitrite: NEGATIVE
Protein, ur: NEGATIVE mg/dL
Specific Gravity, Urine: 1.021 (ref 1.005–1.030)
pH: 5 (ref 5.0–8.0)

## 2021-12-27 LAB — COMPREHENSIVE METABOLIC PANEL
ALT: 15 U/L (ref 0–44)
AST: 16 U/L (ref 15–41)
Albumin: 4.2 g/dL (ref 3.5–5.0)
Alkaline Phosphatase: 57 U/L (ref 38–126)
Anion gap: 8 (ref 5–15)
BUN: 16 mg/dL (ref 6–20)
CO2: 22 mmol/L (ref 22–32)
Calcium: 8.7 mg/dL — ABNORMAL LOW (ref 8.9–10.3)
Chloride: 109 mmol/L (ref 98–111)
Creatinine, Ser: 0.95 mg/dL (ref 0.44–1.00)
GFR, Estimated: >60 mL/min (ref >60)
Glucose, Bld: 96 mg/dL (ref 70–99)
Potassium: 4.1 mmol/L (ref 3.5–5.1)
Sodium: 139 mmol/L (ref 135–145)
Total Bilirubin: 1.4 mg/dL — ABNORMAL HIGH (ref 0.3–1.2)
Total Protein: 8.2 g/dL — ABNORMAL HIGH (ref 6.5–8.1)

## 2021-12-27 LAB — CBC WITH DIFFERENTIAL/PLATELET
Abs Immature Granulocytes: 0.06 10*3/uL (ref 0.00–0.07)
Basophils Absolute: 0.1 10*3/uL (ref 0.0–0.1)
Basophils Relative: 0 %
Eosinophils Absolute: 0.1 10*3/uL (ref 0.0–0.5)
Eosinophils Relative: 1 %
HCT: 41.2 % (ref 36.0–46.0)
Hemoglobin: 13.3 g/dL (ref 12.0–15.0)
Immature Granulocytes: 0 %
Lymphocytes Relative: 22 %
Lymphs Abs: 3.2 10*3/uL (ref 0.7–4.0)
MCH: 26.6 pg (ref 26.0–34.0)
MCHC: 32.3 g/dL (ref 30.0–36.0)
MCV: 82.4 fL (ref 80.0–100.0)
Monocytes Absolute: 0.7 10*3/uL (ref 0.1–1.0)
Monocytes Relative: 5 %
Neutro Abs: 10.2 10*3/uL — ABNORMAL HIGH (ref 1.7–7.7)
Neutrophils Relative %: 72 %
Platelets: 318 10*3/uL (ref 150–400)
RBC: 5 MIL/uL (ref 3.87–5.11)
RDW: 15.7 % — ABNORMAL HIGH (ref 11.5–15.5)
WBC: 14.3 10*3/uL — ABNORMAL HIGH (ref 4.0–10.5)
nRBC: 0 % (ref 0.0–0.2)

## 2021-12-27 LAB — LIPASE, BLOOD: Lipase: 21 U/L (ref 11–51)

## 2021-12-27 MED ORDER — OXYCODONE-ACETAMINOPHEN 5-325 MG PO TABS: 1.0000 | ORAL_TABLET | Freq: Four times a day (QID) | ORAL | 0 refills | Status: DC | PRN

## 2021-12-27 MED ORDER — KETOROLAC TROMETHAMINE 60 MG/2ML IM SOLN
60.0000 mg | Freq: Once | INTRAMUSCULAR | Status: AC
  Administered 2021-12-27: 60 mg via INTRAMUSCULAR
  Filled 2021-12-27: qty 2

## 2021-12-27 MED ORDER — OXYCODONE-ACETAMINOPHEN 5-325 MG PO TABS
2.0000 | ORAL_TABLET | Freq: Once | ORAL | Status: AC
  Administered 2021-12-27: 2 via ORAL
  Filled 2021-12-27: qty 2

## 2021-12-27 NOTE — ED Triage Notes (Signed)
Per EMS- Patient c/o abdominal pain. Patient reports a history of endometriosis and a left ovarian cyst. Patient reports that she was suppose to have a partial hysterectomy in December 2022, but  was unable to do so.  EMS gave Fentanyl 150 mcg prior to arrival to the ED.

## 2021-12-27 NOTE — ED Provider Triage Note (Signed)
Emergency Medicine Provider Triage Evaluation Note  Kristin Boyd , a 41 y.o. female  was evaluated in triage.  Pt complains of generalized abdominal pain.  Symptoms started yesterday morning.  She denies associated nausea or vomiting, diarrhea or constipation, denies any dysuria, vaginal bleeding or vaginal discharge.  Patient was supposed to have a partial hysterectomy in December but then was found to have endometriosis throughout the abdomen and so the surgery was delayed.  She also has a left ovarian cyst.  Has had similar episodes of pain previously attributed to these issues.  Came to the ED yesterday but left prior to being seen due to wait times.  Review of Systems  Positive: Abdominal pain Negative: Fevers, vomiting, dysuria, vaginal bleeding, vaginal discharge  Physical Exam  BP (!) 157/133    Pulse 68    Temp 98.8 F (37.1 C) (Oral)    Resp 18    Ht 5\' 9"  (1.753 m)    Wt (!) 156.5 kg    SpO2 100%    BMI 50.95 kg/m  Gen:   Awake, no distress   Resp:  Normal effort  MSK:   Moves extremities without difficulty  Other:  Patient reports diffuse abdominal pain but no focal tenderness on exam  Medical Decision Making  Medically screening exam initiated at 5:08 PM.  Appropriate orders placed.  Kristin Boyd was informed that the remainder of the evaluation will be completed by another provider, this initial triage assessment does not replace that evaluation, and the importance of remaining in the ED until their evaluation is complete.  Work-up initiated with abdominal labs.   [name], [place] 12/27/21 1709

## 2021-12-27 NOTE — ED Provider Notes (Signed)
[facility] DEPT Provider Note   CSN: [id number] Arrival date & time: 12/27/21  1648     History  Chief Complaint  Patient presents with   Abdominal Pain    Kristin Boyd is a 41 y.o. female.  41 year old female with history of chronic abdominal pain secondary to endometriosis as well as ovarian cyst presents with worsening chronic abdominal pain.  She denies any fever or chills.  No emesis noted.  Had normal bowel movement yesterday.  No urinary symptoms.  Has used ibuprofen at home without relief      Home Medications Prior to Admission medications   Medication Sig Start Date End Date Taking? Authorizing Provider  acetaminophen (TYLENOL) 500 MG tablet Take 1,000 mg by mouth every 6 (six) hours as needed for moderate pain.    [provider]  ibuprofen (ADVIL) 600 MG tablet Take 1 tablet (600 mg total) by mouth every 6 (six) hours as needed for moderate pain. For AFTER surgery only 10/23/21   Joylene John D, NP  norethindrone (AYGESTIN) 5 MG tablet Take 1 tablet (5 mg total) by mouth daily. 11/20/21   Lafonda Mosses, MD  oxyCODONE (OXY IR/ROXICODONE) 5 MG immediate release tablet Take 1 tablet (5 mg total) by mouth every 6 (six) hours as needed for severe pain. For AFTER surgery only, do not take and drive 08[birth year]/82   Cross, Lenna Sciara D, NP  senna-docusate (SENOKOT-S) 8.6-50 MG tablet Take 2 tablets by mouth at bedtime. For AFTER surgery, do not take if having diarrhea Patient not taking: Reported on 11/19/2021 10/23/21   Joylene John D, NP      Allergies    Peanut (diagnostic) and Penicillins    Review of Systems   Review of Systems  All other systems reviewed and are negative.  Physical Exam Updated Vital Signs BP (!) 157/133    Pulse 68    Temp 98.8 F (37.1 C) (Oral)    Resp 18    Ht 1.753 m (5\' 9" )    Wt (!) 156.5 kg    SpO2 100%    BMI 50.95 kg/m  Physical Exam Vitals and nursing note reviewed.  Constitutional:       General: She is not in acute distress.    Appearance: Normal appearance. She is well-developed. She is not toxic-appearing.  HENT:     Head: Normocephalic and atraumatic.  Eyes:     General: Lids are normal.     Conjunctiva/sclera: Conjunctivae normal.     Pupils: Pupils are equal, round, and reactive to light.  Neck:     Thyroid: No thyroid mass.     Trachea: No tracheal deviation.  Cardiovascular:     Rate and Rhythm: Normal rate and regular rhythm.     Heart sounds: Normal heart sounds. No murmur heard.   No gallop.  Pulmonary:     Effort: Pulmonary effort is normal. No respiratory distress.     Breath sounds: Normal breath sounds. No stridor. No decreased breath sounds, wheezing, rhonchi or rales.  Abdominal:     General: There is no distension.     Palpations: Abdomen is soft.     Tenderness: There is no abdominal tenderness. There is no guarding or rebound.  Musculoskeletal:        General: No tenderness. Normal range of motion.     Cervical back: Normal range of motion and neck supple.  Skin:    General: Skin is warm and dry.     Findings:  No abrasion or rash.  Neurological:     Mental Status: She is alert and oriented to person, place, and time. Mental status is at baseline.     GCS: GCS eye subscore is 4. GCS verbal subscore is 5. GCS motor subscore is 6.     Cranial Nerves: No cranial nerve deficit.     Sensory: No sensory deficit.     Motor: Motor function is intact.  Psychiatric:        Attention and Perception: Attention normal.        Speech: Speech normal.        Behavior: Behavior normal.    ED Results / Procedures / Treatments   Labs (all labs ordered are listed, but only abnormal results are displayed) Labs Reviewed  COMPREHENSIVE METABOLIC PANEL - Abnormal; Notable for the following components:      Result Value   Calcium 8.7 (*)    Total Protein 8.2 (*)    Total Bilirubin 1.4 (*)    All other components within normal limits  CBC WITH  DIFFERENTIAL/PLATELET - Abnormal; Notable for the following components:   WBC 14.3 (*)    RDW 15.7 (*)    Neutro Abs 10.2 (*)    All other components within normal limits  URINALYSIS, ROUTINE W REFLEX MICROSCOPIC - Abnormal; Notable for the following components:   APPearance HAZY (*)    Ketones, ur 20 (*)    All other components within normal limits  LIPASE, BLOOD    EKG None  Radiology No results found.  Procedures Procedures    Medications Ordered in ED Medications  ketorolac (TORADOL) injection 60 mg (has no administration in time range)  oxyCODONE-acetaminophen (PERCOCET/ROXICET) 5-325 MG per tablet 2 tablet (has no administration in time range)    ED Course/ Medical Decision Making/ A&P                           Medical Decision Making Risk Prescription drug management.  Old records reviewed. Patient's labs are reassuring with exception of mild leukocytosis.  She has no evidence of surgical abdomen at this time.  Patient states her current symptoms are similar to her prior to chronic abdominal pain.  Do not feel that she needs any abdominal pelvic imaging at this time.  Have given a dose of Toradol as well as Percocet here.  Patient will be referred back to her gynecologist        Final Clinical Impression(s) / ED Diagnoses Final diagnoses:  None    Rx / DC Orders ED Discharge Orders     None         Lacretia Leigh, MD 12/27/21 [name]

## 2021-12-27 NOTE — Telephone Encounter (Signed)
Following up with Kristin Boyd this afternoon. Patient states she went to the ER last night for pain but ended up leaving due to the long wait time. She states she had to take her son to an appointment today and will be going to the ER at [facility] afterwards. She is currently at her sons appointment waiting for the doctor. Reviewed CBC results from ER yesterday with patient. WBC are elevated (14.3). She denies fever, chills, nausea or vomiting. Reports regular BM with last BM this morning. She reports she has been taking her norethindrone. Instructed patient on the risk of overdose and dependence with opioids. Per Dr. Berline Lopes we can try prescribing gabapentin. Patient verbalized understanding and states she would like more information about gabapentin sent to her mychart. Advised patient that information will be sent and we will follow up with her after her ER visit. Patient verbalized understanding. Instructed to call with any needs. MD notified.

## 2021-12-30 ENCOUNTER — Encounter: Payer: Self-pay | Admitting: Gynecologic Oncology

## 2021-12-30 ENCOUNTER — Telehealth: Payer: Self-pay

## 2021-12-30 NOTE — Telephone Encounter (Signed)
Following up with Kristin Boyd regarding her referral for endometriosis. [facility] was not in network for her insurance. [facility] is also not in network with her insurance. A referral has been placed for the [place] at [facility]. Patient is agreeable to this. Patient requests information be sent to her via mychart message so that she can call and follow up. Instructed patient to call with any needs.

## 2021-12-31 ENCOUNTER — Telehealth: Payer: Self-pay

## 2021-12-31 NOTE — Telephone Encounter (Signed)
Received phone call from Ms. Kromer this morning. Patient states she called the Center for [facility] and they are not able to see her until April. Patient inquiring if there are any other options?  "The pain is taking a toll on me. I've been taking two 800mg  ibuprofen at a time and would prefer something be called in so I only have to take one pill. I have a prescription for pain medicine at the pharmacy I just haven't been able to pick it up." Patient states she is due to see Dr. Berline Lopes in March for a follow up and is hoping to see a specialist for endometriosis before then. Patient is not established with a gynecologist. Joylene John, NP notified. Advised patient to contact [place] to see if she can get an appointment with Dr. Christophe Louis. Contact number [phone] provided to patient. Patient verbalized understanding.

## 2022-01-01 ENCOUNTER — Telehealth: Payer: Self-pay

## 2022-01-01 ENCOUNTER — Encounter: Payer: Self-pay | Admitting: Gynecologic Oncology

## 2022-01-01 NOTE — Telephone Encounter (Signed)
Following up with Kristin Boyd this afternoon.Patient states she read more about amitriptyline and gabapentin and she is not comfortable taking either medication. Advised patient that a referral has been placed for a pain management clinic with [facility] and she should be receiving a call from them. Patient verbalized understanding.  Patient reports she was able to get an appointment with Kristin Boyd at center for [facility]. This appointment is on 03/03/22.  Instructed patient to call with any questions or concerns.

## 2022-01-16 ENCOUNTER — Emergency Department (HOSPITAL_COMMUNITY)
Admission: EM | Admit: 2022-01-16 | Discharge: 2022-01-16 | Disposition: A | Discharge status: Home / Self Care | Payer: 59 | Attending: Emergency Medicine | Admitting: Emergency Medicine

## 2022-01-16 DIAGNOSIS — N80109 Endometriosis of ovary, unspecified side, unspecified depth: Secondary | ICD-10-CM | POA: Diagnosis not present

## 2022-01-16 DIAGNOSIS — G8929 Other chronic pain: Secondary | ICD-10-CM | POA: Diagnosis not present

## 2022-01-16 DIAGNOSIS — R109 Unspecified abdominal pain: Secondary | ICD-10-CM

## 2022-01-16 DIAGNOSIS — R102 Pelvic and perineal pain: Secondary | ICD-10-CM | POA: Diagnosis not present

## 2022-01-16 DIAGNOSIS — R112 Nausea with vomiting, unspecified: Secondary | ICD-10-CM | POA: Insufficient documentation

## 2022-01-16 DIAGNOSIS — Z91010 Allergy to peanuts: Secondary | ICD-10-CM | POA: Diagnosis not present

## 2022-01-16 DIAGNOSIS — D72829 Elevated white blood cell count, unspecified: Secondary | ICD-10-CM | POA: Diagnosis not present

## 2022-01-16 DIAGNOSIS — R1030 Lower abdominal pain, unspecified: Secondary | ICD-10-CM | POA: Diagnosis present

## 2022-01-16 LAB — CBC WITH DIFFERENTIAL/PLATELET
Abs Immature Granulocytes: 0.06 10*3/uL (ref 0.00–0.07)
Basophils Absolute: 0.1 10*3/uL (ref 0.0–0.1)
Basophils Relative: 0 %
Eosinophils Absolute: 0 10*3/uL (ref 0.0–0.5)
Eosinophils Relative: 0 %
HCT: 43.3 % (ref 36.0–46.0)
Hemoglobin: 13.9 g/dL (ref 12.0–15.0)
Immature Granulocytes: 0 %
Lymphocytes Relative: 15 %
Lymphs Abs: 2.3 10*3/uL (ref 0.7–4.0)
MCH: 26.1 pg (ref 26.0–34.0)
MCHC: 32.1 g/dL (ref 30.0–36.0)
MCV: 81.4 fL (ref 80.0–100.0)
Monocytes Absolute: 0.9 10*3/uL (ref 0.1–1.0)
Monocytes Relative: 5 %
Neutro Abs: 12.4 10*3/uL — ABNORMAL HIGH (ref 1.7–7.7)
Neutrophils Relative %: 80 %
Platelets: 349 10*3/uL (ref 150–400)
RBC: 5.32 MIL/uL — ABNORMAL HIGH (ref 3.87–5.11)
RDW: 15.6 % — ABNORMAL HIGH (ref 11.5–15.5)
WBC: 15.6 10*3/uL — ABNORMAL HIGH (ref 4.0–10.5)
nRBC: 0 % (ref 0.0–0.2)

## 2022-01-16 LAB — URINALYSIS, ROUTINE W REFLEX MICROSCOPIC
Bilirubin Urine: NEGATIVE
Glucose, UA: NEGATIVE mg/dL
Hgb urine dipstick: NEGATIVE
Ketones, ur: 20 mg/dL — AB
Nitrite: NEGATIVE
Protein, ur: 100 mg/dL — AB
Specific Gravity, Urine: 1.027 (ref 1.005–1.030)
pH: 5 (ref 5.0–8.0)

## 2022-01-16 LAB — COMPREHENSIVE METABOLIC PANEL
ALT: 13 U/L (ref 0–44)
AST: 13 U/L — ABNORMAL LOW (ref 15–41)
Albumin: 4.5 g/dL (ref 3.5–5.0)
Alkaline Phosphatase: 62 U/L (ref 38–126)
Anion gap: 11 (ref 5–15)
BUN: 16 mg/dL (ref 6–20)
CO2: 18 mmol/L — ABNORMAL LOW (ref 22–32)
Calcium: 9.2 mg/dL (ref 8.9–10.3)
Chloride: 111 mmol/L (ref 98–111)
Creatinine, Ser: 0.8 mg/dL (ref 0.44–1.00)
GFR, Estimated: >60 mL/min (ref >60)
Glucose, Bld: 100 mg/dL — ABNORMAL HIGH (ref 70–99)
Potassium: 3.5 mmol/L (ref 3.5–5.1)
Sodium: 140 mmol/L (ref 135–145)
Total Bilirubin: 1.3 mg/dL — ABNORMAL HIGH (ref 0.3–1.2)
Total Protein: 8.5 g/dL — ABNORMAL HIGH (ref 6.5–8.1)

## 2022-01-16 LAB — LIPASE, BLOOD: Lipase: 22 U/L (ref 11–51)

## 2022-01-16 LAB — I-STAT BETA HCG BLOOD, ED (MC, WL, AP ONLY): I-stat hCG, quantitative: <5 m[IU]/mL (ref <5)

## 2022-01-16 MED ORDER — ONDANSETRON HCL 4 MG/2ML IJ SOLN
4.0000 mg | Freq: Once | INTRAMUSCULAR | Status: AC
  Administered 2022-01-16: 4 mg via INTRAVENOUS
  Filled 2022-01-16: qty 2

## 2022-01-16 MED ORDER — KETOROLAC TROMETHAMINE 30 MG/ML IJ SOLN
30.0000 mg | Freq: Once | INTRAMUSCULAR | Status: AC
  Administered 2022-01-16: 30 mg via INTRAVENOUS
  Filled 2022-01-16: qty 1

## 2022-01-16 MED ORDER — OXYCODONE-ACETAMINOPHEN 5-325 MG PO TABS
1.0000 | ORAL_TABLET | Freq: Once | ORAL | Status: AC
  Administered 2022-01-16: 1 via ORAL
  Filled 2022-01-16: qty 1

## 2022-01-16 MED ORDER — SODIUM CHLORIDE 0.9 % IV BOLUS
1000.0000 mL | Freq: Once | INTRAVENOUS | Status: AC
  Administered 2022-01-16: 1000 mL via INTRAVENOUS

## 2022-01-16 MED ORDER — HYDROMORPHONE HCL 1 MG/ML IJ SOLN
1.0000 mg | Freq: Once | INTRAMUSCULAR | Status: AC
  Administered 2022-01-16: 1 mg via INTRAVENOUS
  Filled 2022-01-16: qty 1

## 2022-01-16 MED ORDER — MORPHINE SULFATE (PF) 4 MG/ML IV SOLN
4.0000 mg | Freq: Once | INTRAVENOUS | Status: AC
  Administered 2022-01-16: 4 mg via INTRAVENOUS
  Filled 2022-01-16: qty 1

## 2022-01-16 NOTE — ED Provider Notes (Signed)
[facility] DEPT Provider Note   CSN: [id number] Arrival date & time: 01/16/22  1287     History  Chief Complaint  Patient presents with   Abdominal Pain    Kristin Boyd is a 41 y.o. female.  Kristin Boyd is a 41 y.o. female with a history of chronic abdominal and pelvic pain related to endometriosis, ovarian cysts, who presents to the emergency department for eval patient abdominal pain.  Patient reports for the past 2 days she has been having persistent lower abdominal pain.  She reports pain radiates into her low back.  Patient reports that this is typical of her chronic abdominal pain related to her endometriosis.  She reports she recently started following with pain management and they have been treating her pain with hydrocodone but it has not been helping with this most recent exacerbation.  Reports she was previously taking ibuprofen as well but stopped this.  She reports that she called her pain doctor and got permission to be treated for this pain in the ED today.  She denies any current vaginal bleeding, vaginal discharge and she denies any dysuria or urinary frequency.  She reports she did become nauseated and had 1 episode of vomiting today when the pain became severe.  She denies diarrhea or constipation.   The history is provided by the patient and medical records.      Home Medications Prior to Admission medications   Medication Sig Start Date End Date Taking? Authorizing Provider  acetaminophen (TYLENOL) 500 MG tablet Take 1,000 mg by mouth every 6 (six) hours as needed for moderate pain.    [provider]  ibuprofen (ADVIL) 600 MG tablet Take 1 tablet (600 mg total) by mouth every 6 (six) hours as needed for moderate pain. For AFTER surgery only 10/23/21   Joylene John D, NP  norethindrone (AYGESTIN) 5 MG tablet Take 1 tablet (5 mg total) by mouth daily. 11/20/21   Lafonda Mosses, MD  oxyCODONE (OXY IR/ROXICODONE) 5  MG immediate release tablet Take 1 tablet (5 mg total) by mouth every 6 (six) hours as needed for severe pain. For AFTER surgery only, do not take and drive 86/7/67   Cross, Lenna Sciara D, NP  oxyCODONE-acetaminophen (PERCOCET/ROXICET) 5-325 MG tablet Take 1-2 tablets by mouth every 6 (six) hours as needed for severe pain. 12/27/21   Lacretia Leigh, MD  senna-docusate (SENOKOT-S) 8.6-50 MG tablet Take 2 tablets by mouth at bedtime. For AFTER surgery, do not take if having diarrhea Patient not taking: Reported on 11/19/2021 10/23/21   Joylene John D, NP      Allergies    Peanut (diagnostic) and Penicillins    Review of Systems   Review of Systems  Constitutional:  Negative for chills and fever.  HENT: Negative.    Respiratory:  Negative for cough and shortness of breath.   Cardiovascular:  Negative for chest pain.  Gastrointestinal:  Positive for abdominal pain, nausea and vomiting. Negative for diarrhea.  Genitourinary:  Positive for pelvic pain. Negative for dysuria, frequency, vaginal bleeding and vaginal discharge.  All other systems reviewed and are negative.  Physical Exam Updated Vital Signs Temp 98.9 F (37.2 C) (Oral)    Resp 20    Ht 5\' 9"  (1.753 m)    Wt (!) 158.8 kg    BMI 51.69 kg/m  Physical Exam Vitals and nursing note reviewed.  Constitutional:      General: She is not in acute distress.    Appearance:  Normal appearance. She is well-developed. She is not diaphoretic.     Comments: Patient is alert, appears uncomfortable, yelling out in pain but is nontoxic-appearing  HENT:     Head: Normocephalic and atraumatic.  Eyes:     General:        Right eye: No discharge.        Left eye: No discharge.     Pupils: Pupils are equal, round, and reactive to light.  Cardiovascular:     Rate and Rhythm: Normal rate and regular rhythm.     Pulses: Normal pulses.     Heart sounds: Normal heart sounds.  Pulmonary:     Effort: Pulmonary effort is normal. No respiratory distress.      Breath sounds: Normal breath sounds. No wheezing or rales.     Comments: Respirations equal and unlabored, patient able to speak in full sentences, lungs clear to auscultation bilaterally  Abdominal:     General: Bowel sounds are normal. There is no distension.     Palpations: Abdomen is soft. There is no mass.     Tenderness: There is no abdominal tenderness. There is no guarding.     Comments: Abdomen soft, nondistended, despite patient yelling out in pain she does not have any focal abdominal tenderness and reports it does not hurt worse when I palpate any one area of her abdomen.  Musculoskeletal:        General: No deformity.     Cervical back: Neck supple.  Skin:    General: Skin is warm and dry.     Capillary Refill: Capillary refill takes less than 2 seconds.  Neurological:     Mental Status: She is alert and oriented to person, place, and time.     Coordination: Coordination normal.     Comments: Speech is clear, able to follow commands Moves extremities without ataxia, coordination intact  Psychiatric:        Mood and Affect: Mood normal.        Behavior: Behavior normal.    ED Results / Procedures / Treatments   Labs (all labs ordered are listed, but only abnormal results are displayed) Labs Reviewed  COMPREHENSIVE METABOLIC PANEL - Abnormal; Notable for the following components:      Result Value   CO2 18 (*)    Glucose, Bld 100 (*)    Total Protein 8.5 (*)    AST 13 (*)    Total Bilirubin 1.3 (*)    All other components within normal limits  CBC WITH DIFFERENTIAL/PLATELET - Abnormal; Notable for the following components:   WBC 15.6 (*)    RBC 5.32 (*)    RDW 15.6 (*)    Neutro Abs 12.4 (*)    All other components within normal limits  URINALYSIS, ROUTINE W REFLEX MICROSCOPIC - Abnormal; Notable for the following components:   Color, Urine AMBER (*)    APPearance CLOUDY (*)    Ketones, ur 20 (*)    Protein, ur 100 (*)    Leukocytes,Ua SMALL (*)     Bacteria, UA FEW (*)    All other components within normal limits  LIPASE, BLOOD  I-STAT BETA HCG BLOOD, ED ([facility], [facility], [facility] ONLY)    EKG None  Radiology No results found.  Procedures Procedures    Medications Ordered in ED Medications  ondansetron (ZOFRAN) injection 4 mg (4 mg Intravenous Given 01/16/22 0947)  morphine (PF) 4 MG/ML injection 4 mg (4 mg Intravenous Given 01/16/22 0949)  sodium chloride  0.9 % bolus 1,000 mL (0 mLs Intravenous Stopped 01/16/22 1258)  ketorolac (TORADOL) 30 MG/ML injection 30 mg (30 mg Intravenous Given 01/16/22 1027)  HYDROmorphone (DILAUDID) injection 1 mg (1 mg Intravenous Given 01/16/22 1029)  oxyCODONE-acetaminophen (PERCOCET/ROXICET) 5-325 MG per tablet 1 tablet (1 tablet Oral Given 01/16/22 1235)    ED Course/ Medical Decision Making/ A&P                           41 y.o. female presents to the ED with complaints of abdominal pain, which feels consistent with her prior episodes of chronic abdominal pain related to endometriosis, this involves an extensive number of treatment options, and is a complaint that carries with it a high risk of complications and morbidity.  Differential diagnosis includes continued chronic abdominal pain from endometriosis, colitis, bowel obstruction, perforation, appendicitis, diverticulitis, urinary tract infection  On arrival pt is nontoxic, vitals WNL. Exam without any focal abdominal tenderness despite patient yelling out in pain  Additional history obtained from chart review.  Outside records obtained and reviewed from prior ED visits with very similar episodes of abdominal pain  I ordered IV fluids, morphine and Zofran for symptomatic treatment  On reevaluation patient reported little improvement in her pain, Toradol and 1 dose of Dilaudid ordered for additional treatment.  Lab Tests:  I Ordered, reviewed, and interpreted labs, which included: Mild leukocytosis of 15.6, similar to recent blood work, stable hemoglobin, no  significant electrolyte derangements, normal renal and liver function, negative lipase, negative pregnancy.  Urinalysis without signs of infection, there is some signs of contamination with squamous cells present.  Imaging Studies ordered:  Considered abdominal imaging but given that this feels like her prior episodes of pain, with known underlying endometriosis and reassuring lab work do not feel that this would change management today.  ED Course:   After second round of pain medication on reevaluation patient's pain has resolved, she is now much more comfortable, no longer yelling out, and tolerating p.o. fluids.  Discussed reassuring lab work.  Had shared decision making discussion with patient she is in agreement on not pursuing further evaluation with imaging today.  We will have patient continue to follow-up with her OB/GYN and with pain management.  At this time feel patient is appropriate for discharge home with continued outpatient treatment.  She expresses understanding and agreement.  Return precautions provided.   Portions of this note were generated with [employer]. Dictation errors may occur despite best attempts at proofreading.         Final Clinical Impression(s) / ED Diagnoses Final diagnoses:  Chronic abdominal pain    Rx / DC Orders ED Discharge Orders     None         Janet Berlin 01/16/22 1520    Valarie Merino, MD 01/16/22 1521

## 2022-01-16 NOTE — Discharge Instructions (Addendum)
Please continue to follow-up with your gynecologist and pain management doctor for continued treatment of your chronic abdominal pain.  Your evaluation today was reassuring and I am glad that pain has improved.  In addition to your prescribed pain medication please continue using ibuprofen for pain.  Return for new or worsening symptoms.

## 2022-01-16 NOTE — ED Triage Notes (Signed)
Ems brings pt in for abdominal pain for the past 2 days. History of endometriosis and left ovarian cyst. Pt also reports lower back pain.

## 2022-01-20 ENCOUNTER — Telehealth: Payer: Self-pay | Admitting: *Deleted

## 2022-01-20 NOTE — Telephone Encounter (Signed)
Patient requested earlier appt date; appt moved from 3/17 to 2/20

## 2022-02-03 ENCOUNTER — Encounter: Payer: Self-pay | Admitting: Gynecologic Oncology

## 2022-02-03 ENCOUNTER — Other Ambulatory Visit: Payer: Self-pay | Admitting: Gynecologic Oncology

## 2022-02-03 ENCOUNTER — Other Ambulatory Visit: Payer: Self-pay

## 2022-02-03 ENCOUNTER — Inpatient Hospital Stay: Payer: 59 | Attending: Gynecologic Oncology | Admitting: Gynecologic Oncology

## 2022-02-03 VITALS — BP 161/101 | HR 84 | Temp 99.4°F | Resp 18 | Ht 69.02 in | Wt 345.9 lb

## 2022-02-03 DIAGNOSIS — N83202 Unspecified ovarian cyst, left side: Secondary | ICD-10-CM | POA: Diagnosis not present

## 2022-02-03 DIAGNOSIS — Z6841 Body Mass Index (BMI) 40.0 and over, adult: Secondary | ICD-10-CM

## 2022-02-03 DIAGNOSIS — Z7189 Other specified counseling: Secondary | ICD-10-CM | POA: Diagnosis not present

## 2022-02-03 DIAGNOSIS — N809 Endometriosis, unspecified: Secondary | ICD-10-CM

## 2022-02-03 DIAGNOSIS — N9489 Other specified conditions associated with female genital organs and menstrual cycle: Secondary | ICD-10-CM

## 2022-02-03 MED ORDER — ORILISSA 150 MG PO TABS: 1.0000 | ORAL_TABLET | Freq: Every day | ORAL | 6 refills | Status: DC

## 2022-02-03 NOTE — Patient Instructions (Signed)
It was good to see you today.  I am glad that you are feeling better overall.  Congratulations on your weight loss so far.  We will let you know when we hear about your insurance and the medication request that we sent in.  I really like to use this if your insurance will cover most or all of it.  I will call you once I have your MRI results.

## 2022-02-03 NOTE — Progress Notes (Signed)
Gynecologic Oncology Return Clinic Visit  01/14/2022  Reason for Visit: Follow-up in the setting of endometriosis, pelvic mass  Treatment History: Patient's history is notable for an endometrial ablation at the age of 41.  She describes her periods as being quite heavy at this time and occurring sometimes multiple times a month.  She then had intermittent pain after the ablation but then in 2019 started to have progressive pain.  She ultimately underwent imaging with CT scan and pelvic ultrasound in August 2020 which showed an 8.3 x 6.6 x 8.9 cm complex cystic and solid left adnexal mass.  Follow-up ultrasound showed a complex and cystic lesion in the left adnexa with a tubular shape measuring 12.6 x 5.6 x 7.4 cm; mass on imaging was felt to perhaps represent a tubo-ovarian abscess.  CA125 was obtained at that time and was mildly elevated at 51.  The plan at that time was for referral to GYN oncology, which did not appear to have happened.     She then had multiple visits in the emergency department with persistence of the mass noted.  Most recently, she had a consultation with Dr. Gwenlyn Found at [facility] with a plan to proceed with surgical excision.  Most recent imaging by ultrasound in mid October showed a left adnexal mass measuring 10.3 x 11.4 x 11.2 cm with associated septation and vascularity although poorly visualized.  Patient was seen in our emergency department on the fifth of this month.  She underwent CT and pelvic ultrasound which show persistent mass without significant change in size since most recent ultrasound.    11/12/2021: Diagnostic surgery for a complex adnexal mass and thickened endometrium.  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  Interval History: Since her last visit with me, she has had 3 emergency department visits (1 outside of our system and 2  within).  She has now established with the pain clinic and between pain medication and the [place] syndrome, feels that her symptoms have improved significantly.  She denies any side effects related to the norethindrone.  She is using Percocet and ibuprofen up to twice a day to help with pain control.  This is down from having to take pain medication 3 or 4 times a day as recently as within the last few months.  When she does have pain, she describes this as lower abdominal pain with radiation to her low back and bilateral hips.  She endorses mild decrease in appetite and is actively trying to lose weight.  Reports normal bowel and bladder function.  Past Medical/Surgical History: Past Medical History:  Diagnosis Date   Arthritis    Chronic knee pain    Chronic pelvic pain in female    Obese    Ovarian cyst    Pneumonia due to COVID-19 virus 05/26/2019    Past Surgical History:  Procedure Laterality Date   CESAREAN SECTION     CHOLECYSTECTOMY     DILATION AND CURETTAGE OF UTERUS N/A 11/12/2021   Procedure: ATTEMPTED HYSTEROSCOPY;  Surgeon: Lafonda Mosses, MD;  Location: [facility];  Service: Gynecology;  Laterality: N/A;   ENDOMETRIAL ABLATION     WISDOM TOOTH EXTRACTION     2 removed   XI ROBOTIC ASSISTED SALPINGECTOMY N/A 11/12/2021   Procedure: DIAGNOSTIC LAPAROSCOPY, PERITONEAL BIOPSIES;  Surgeon: Lafonda Mosses, MD;  Location: [facility];  Service: Gynecology;  Laterality: N/A;    Family  History  Problem Relation Age of Onset   Hypertension Father    Diabetes Father    Colon cancer Neg Hx    Breast cancer Neg Hx    Ovarian cancer Neg Hx    Endometrial cancer Neg Hx    Pancreatic cancer Neg Hx    Prostate cancer Neg Hx     Social History   Socioeconomic History   Marital status: Single    Spouse name: Not on file   Number of children: Not on file   Years of education: Not on file   Highest education level: Not on file  Occupational History   Not on file  Tobacco  Use   Smoking status: Former   Smokeless tobacco: Never  Vaping Use   Vaping Use: Never used  Substance and Sexual Activity   Alcohol use: Yes    Comment: occasional   Drug use: Never   Sexual activity: Not on file  Other Topics Concern   Not on file  Social History Narrative   Not on file   Social Determinants of Health   Financial Resource Strain: Not on file  Food Insecurity: Not on file  Transportation Needs: Not on file  Physical Activity: Not on file  Stress: Not on file  Social Connections: Not on file    Current Medications:  Current Outpatient Medications:    hydrOXYzine (ATARAX) 25 MG tablet, Take 25 mg by mouth every 6 (six) hours as needed., Disp: , Rfl:    ibuprofen (ADVIL) 800 MG tablet, Take 800 mg by mouth 4 (four) times daily as needed., Disp: , Rfl:    norethindrone (AYGESTIN) 5 MG tablet, Take 1 tablet (5 mg total) by mouth daily., Disp: 30 tablet, Rfl: 6   oxyCODONE-acetaminophen (PERCOCET/ROXICET) 5-325 MG tablet, Take 1-2 tablets by mouth every 6 (six) hours as needed for severe pain., Disp: 10 tablet, Rfl: 0   senna-docusate (SENOKOT-S) 8.6-50 MG tablet, Take 2 tablets by mouth at bedtime. For AFTER surgery, do not take if having diarrhea (Patient taking differently: Take 2 tablets by mouth as needed. For AFTER surgery, do not take if having diarrhea), Disp: 30 tablet, Rfl: 0   acetaminophen (TYLENOL) 500 MG tablet, Take 1,000 mg by mouth every 6 (six) hours as needed for moderate pain. (Patient not taking: Reported on 02/03/2022), Disp: , Rfl:    Elagolix Sodium (ORILISSA) 150 MG TABS, Take 1 tablet by mouth daily. (Patient not taking: Reported on 02/03/2022), Disp: 30 tablet, Rfl: 6  Review of Systems: Denies appetite changes, fevers, chills, fatigue, unexplained weight changes. Denies hearing loss, neck lumps or masses, mouth sores, ringing in ears or voice changes. Denies cough or wheezing.  Denies shortness of breath. Denies chest pain or  palpitations. Denies leg swelling. Denies abdominal distention, pain, blood in stools, constipation, diarrhea, nausea, vomiting, or early satiety. Denies pain with intercourse, dysuria, frequency, hematuria or incontinence. Denies hot flashes, pelvic pain, vaginal bleeding or vaginal discharge.   Denies joint pain, back pain or muscle pain/cramps. Denies itching, rash, or wounds. Denies dizziness, headaches, numbness or seizures. Denies swollen lymph nodes or glands, denies easy bruising or bleeding. Denies anxiety, depression, confusion, or decreased concentration.  Physical Exam: BP (!) 161/101 (BP Location: Left Arm, Patient Position: Sitting)    Pulse 84    Temp 99.4 F (37.4 C) (Oral)    Resp 18    Ht 5' 9.02" (1.753 m)    Wt (!) 345 lb 14.4 oz (156.9 kg)  SpO2 100%    BMI 51.06 kg/m  General: Alert, oriented, no acute distress. HEENT: Normocephalic, atraumatic, sclera anicteric. Chest: Unlabored breathing on room air.  Laboratory & Radiologic Studies: None new  Assessment & Plan: Kristin Boyd is a 41 y.o. woman with Stage IV endometriosis on norethindrone as well as pain management presenting for follow-up today.  Patient notes overall significant improvement in her pain and symptoms over the last couple of months.  She is tolerating the norethindrone very well.  I have sent a new request in for Orilissa to see if we can get this approved through her insurance.  I think we may be able to obtain some further improvement in terms of pain and other symptoms if were able to cause complete suppression of her ovaries.  She would be amenable to transitioning to [place] if we are able to get this covered.  I congratulated the patient on her weight loss thus far.  In terms of follow-up for her known adnexal mass, her last imaging in our system was in November.  I recommended that we move forward with an MRI, which she has never had.  I am hoping that this will give [place] some further  clarification of whether this cystic structure is in fact a hydrosalpinx versus an endometrioma.  There have been intermittent features described that have raised the concern for possible malignancy.  While my concern is low, given the possibility of malignant transformation of endometriosis, I recommend follow-up imaging.  We will work to get the patient scheduled for an MRI.  I will call her with the results.  We also discussed that ideally we would get to a place in terms of a management plan that would not require use of narcotics.  While she may ultimately need surgery, I reviewed again today how morbid surgery could be given findings at the time of her laparoscopic surgery as well as her medical comorbidities.  32 minutes of total time was spent for this patient encounter, including preparation, face-to-face counseling with the patient and coordination of care, and documentation of the encounter.  Jeral Pinch, MD  Division of Gynecologic Oncology  Department of Obstetrics and Gynecology  [facility]

## 2022-02-05 ENCOUNTER — Telehealth: Payer: Self-pay

## 2022-02-05 NOTE — Telephone Encounter (Signed)
Phone call to [employer] Department to begin appeal process for rejection of coverage for Orilissa 150mg . Reference Number [id number]. Appeals Department will respond with an answer via fax within 72 hours. Provider made aware. ?

## 2022-02-06 ENCOUNTER — Telehealth: Payer: Self-pay

## 2022-02-06 NOTE — Telephone Encounter (Signed)
Following up with Kristin Boyd this morning regarding her prescription for Orilissa. Insurance has approved the [place]. Out of pocket expense for her will be $15.  ? ?Per Kristin Boyd she can stop taking the aygestin and start taking the Orilissa. She does not need to take both medications concurrently. She only needs to take the [place]. Patient verbalized understanding of the above information. Instructed to call with any questions or concerns.  ?

## 2022-02-07 ENCOUNTER — Encounter: Payer: Self-pay | Admitting: Gynecologic Oncology

## 2022-02-10 ENCOUNTER — Other Ambulatory Visit: Payer: Self-pay

## 2022-02-10 ENCOUNTER — Ambulatory Visit (HOSPITAL_COMMUNITY)
Admission: RE | Admit: 2022-02-10 | Discharge: 2022-02-10 | Disposition: A | Discharge status: Home / Self Care | Payer: 59 | Source: Ambulatory Visit | Attending: Gynecologic Oncology | Admitting: Gynecologic Oncology

## 2022-02-10 DIAGNOSIS — N809 Endometriosis, unspecified: Secondary | ICD-10-CM | POA: Diagnosis not present

## 2022-02-10 DIAGNOSIS — N9489 Other specified conditions associated with female genital organs and menstrual cycle: Secondary | ICD-10-CM | POA: Insufficient documentation

## 2022-02-10 MED ORDER — GADOBUTROL 1 MMOL/ML IV SOLN
10.0000 mL | Freq: Once | INTRAVENOUS | Status: AC | PRN
  Administered 2022-02-10: 10 mL via INTRAVENOUS

## 2022-02-21 ENCOUNTER — Ambulatory Visit: Payer: 59 | Admitting: Gynecologic Oncology

## 2022-03-03 ENCOUNTER — Encounter: Payer: 59 | Admitting: Obstetrics and Gynecology

## 2022-05-01 ENCOUNTER — Emergency Department (HOSPITAL_COMMUNITY): Payer: 59

## 2022-05-01 ENCOUNTER — Other Ambulatory Visit: Payer: Self-pay

## 2022-05-01 ENCOUNTER — Emergency Department (HOSPITAL_COMMUNITY)
Admission: EM | Admit: 2022-05-01 | Discharge: 2022-05-01 | Disposition: A | Discharge status: Home / Self Care | Payer: 59 | Attending: Emergency Medicine | Admitting: Emergency Medicine

## 2022-05-01 ENCOUNTER — Encounter (HOSPITAL_COMMUNITY): Payer: Self-pay

## 2022-05-01 DIAGNOSIS — M25561 Pain in right knee: Secondary | ICD-10-CM

## 2022-05-01 DIAGNOSIS — I10 Essential (primary) hypertension: Secondary | ICD-10-CM | POA: Diagnosis not present

## 2022-05-01 DIAGNOSIS — M1711 Unilateral primary osteoarthritis, right knee: Secondary | ICD-10-CM | POA: Diagnosis not present

## 2022-05-01 DIAGNOSIS — Z79899 Other long term (current) drug therapy: Secondary | ICD-10-CM | POA: Diagnosis not present

## 2022-05-01 DIAGNOSIS — R030 Elevated blood-pressure reading, without diagnosis of hypertension: Secondary | ICD-10-CM

## 2022-05-01 DIAGNOSIS — Z91010 Allergy to peanuts: Secondary | ICD-10-CM | POA: Insufficient documentation

## 2022-05-01 LAB — CBC
HCT: 44.1 % (ref 36.0–46.0)
Hemoglobin: 14 g/dL (ref 12.0–15.0)
MCH: 26.5 pg (ref 26.0–34.0)
MCHC: 31.7 g/dL (ref 30.0–36.0)
MCV: 83.5 fL (ref 80.0–100.0)
Platelets: 300 10*3/uL (ref 150–400)
RBC: 5.28 MIL/uL — ABNORMAL HIGH (ref 3.87–5.11)
RDW: 15.5 % (ref 11.5–15.5)
WBC: 9.4 10*3/uL (ref 4.0–10.5)
nRBC: 0 % (ref 0.0–0.2)

## 2022-05-01 LAB — BASIC METABOLIC PANEL
Anion gap: 4 — ABNORMAL LOW (ref 5–15)
BUN: 12 mg/dL (ref 6–20)
CO2: 28 mmol/L (ref 22–32)
Calcium: 8.8 mg/dL — ABNORMAL LOW (ref 8.9–10.3)
Chloride: 108 mmol/L (ref 98–111)
Creatinine, Ser: 0.67 mg/dL (ref 0.44–1.00)
GFR, Estimated: >60 mL/min (ref >60)
Glucose, Bld: 102 mg/dL — ABNORMAL HIGH (ref 70–99)
Potassium: 4.2 mmol/L (ref 3.5–5.1)
Sodium: 140 mmol/L (ref 135–145)

## 2022-05-01 MED ORDER — AMLODIPINE BESYLATE 5 MG PO TABS: 5.0000 mg | ORAL_TABLET | Freq: Every day | ORAL | 0 refills | Status: DC

## 2022-05-01 MED ORDER — TRAMADOL HCL 50 MG PO TABS: 50.0000 mg | ORAL_TABLET | Freq: Four times a day (QID) | ORAL | 0 refills | Status: DC | PRN

## 2022-05-01 NOTE — Discharge Instructions (Addendum)
It was our pleasure to provide your ER care today - we hope that you feel better.  Your xrays show significant arthritis/degenerative changes in the knee. No acute fracture is noted.  Follow up with orthopedist in the next few weeks - call office to arrange appointment.   Take acetaminophen or ibuprofen as need for pain. You may also take ultram as need for pain - no driving for the next 6 hours or when taking ultram.   Your blood pressure is high - limit salt intake, take blood pressure medication as prescribed, and follow up with primary care doctor in one week.   Return to ER if worse, new symptoms, fevers, chest pain, trouble breathing, or other concern.

## 2022-05-01 NOTE — ED Triage Notes (Signed)
Pt c/o right knee pain x 2 days, previous Dx of arthritis. Took ibuprofen and percocet yesterday.

## 2022-05-01 NOTE — ED Provider Notes (Signed)
[place] DEPT Provider Note   CSN: [id number] Arrival date & time: 05/01/22  0751     History  Chief Complaint  Patient presents with   Knee Pain   Hypertension    Kristin Boyd is a 41 y.o. female.  Patient c/o right knee pain. Notes hx arthritis in knees and increased right knee pain in past 2-3 days. Pain dull, moderate-sev, constant. Denies specific injury or strain. No hip or ankle pain. No leg or knee swelling. No fever or chills. Has not had any meds today for pain.   The history is provided by the patient and medical records.  Knee Pain Associated symptoms: no fever       Home Medications Prior to Admission medications   Medication Sig Start Date End Date Taking? Authorizing Provider  amLODipine (NORVASC) 5 MG tablet Take 1 tablet (5 mg total) by mouth daily. 05/01/22  Yes Lajean Saver, MD  traMADol (ULTRAM) 50 MG tablet Take 1 tablet (50 mg total) by mouth every 6 (six) hours as needed. 05/01/22  Yes Lajean Saver, MD  acetaminophen (TYLENOL) 500 MG tablet Take 1,000 mg by mouth every 6 (six) hours as needed for moderate pain. Patient not taking: Reported on 02/03/2022    [provider]  Elagolix Sodium (ORILISSA) 150 MG TABS Take 1 tablet by mouth daily. Patient not taking: Reported on 02/03/2022 02/03/22   Lafonda Mosses, MD  hydrOXYzine (ATARAX) 25 MG tablet Take 25 mg by mouth every 6 (six) hours as needed. 11/27/21   [provider]  ibuprofen (ADVIL) 800 MG tablet Take 800 mg by mouth 4 (four) times daily as needed. 01/20/22   [provider]  norethindrone (AYGESTIN) 5 MG tablet Take 1 tablet (5 mg total) by mouth daily. 11/20/21   Lafonda Mosses, MD  oxyCODONE-acetaminophen (PERCOCET/ROXICET) 5-325 MG tablet Take 1-2 tablets by mouth every 6 (six) hours as needed for severe pain. 12/27/21   Lacretia Leigh, MD  senna-docusate (SENOKOT-S) 8.6-50 MG tablet Take 2 tablets by mouth at bedtime. For  AFTER surgery, do not take if having diarrhea Patient taking differently: Take 2 tablets by mouth as needed. For AFTER surgery, do not take if having diarrhea 10/23/21   Joylene John D, NP      Allergies    Peanut (diagnostic) and Penicillins    Review of Systems   Review of Systems  Constitutional:  Negative for chills and fever.  Respiratory:  Negative for shortness of breath.   Cardiovascular:  Negative for chest pain and leg swelling.  Gastrointestinal:  Negative for nausea and vomiting.  Musculoskeletal:        Knee pain  Skin:  Negative for rash and wound.  Neurological:  Negative for weakness and numbness.   Physical Exam Updated Vital Signs BP (!) 175/104   Pulse 69   Temp 98 F (36.7 C) (Oral)   Resp 18   SpO2 94%  Physical Exam Vitals and nursing note reviewed.  Constitutional:      Appearance: Normal appearance. She is well-developed.  HENT:     Head: Atraumatic.     Nose: Nose normal.     Mouth/Throat:     Mouth: Mucous membranes are moist.  Eyes:     General: No scleral icterus.    Conjunctiva/sclera: Conjunctivae normal.  Neck:     Trachea: No tracheal deviation.  Cardiovascular:     Rate and Rhythm: Normal rate and regular rhythm.     Pulses: Normal  pulses.     Heart sounds: Normal heart sounds. No murmur heard.   No friction rub. No gallop.  Pulmonary:     Effort: Pulmonary effort is normal. No respiratory distress.     Breath sounds: Normal breath sounds.  Abdominal:     General: There is no distension.     Tenderness: There is no abdominal tenderness.  Genitourinary:    Comments: No cva tenderness.  Musculoskeletal:        General: No swelling.     Cervical back: Neck supple. No muscular tenderness.     Comments: Tenderness right knee anteriorly. Knee is grossly stable. No large effusion noted. Distal pulses palp. Good passive rom without severe pain - no findings of septic joint. No pain w rom at hip or ankle.   Skin:    General: Skin is  warm and dry.     Findings: No erythema or rash.  Neurological:     Mental Status: She is alert.     Comments: Alert, speech normal. Motor/sens grossly intact bil. Steady gait.   Psychiatric:        Mood and Affect: Mood normal.    ED Results / Procedures / Treatments   Labs (all labs ordered are listed, but only abnormal results are displayed) Results for orders placed or performed during the hospital encounter of 56/38/93  Basic metabolic panel  Result Value Ref Range   Sodium 140 135 - 145 mmol/L   Potassium 4.2 3.5 - 5.1 mmol/L   Chloride 108 98 - 111 mmol/L   CO2 28 22 - 32 mmol/L   Glucose, Bld 102 (H) 70 - 99 mg/dL   BUN 12 6 - 20 mg/dL   Creatinine, Ser 0.67 0.44 - 1.00 mg/dL   Calcium 8.8 (L) 8.9 - 10.3 mg/dL   GFR, Estimated >60 >60 mL/min   Anion gap 4 (L) 5 - 15  CBC  Result Value Ref Range   WBC 9.4 4.0 - 10.5 K/uL   RBC 5.28 (H) 3.87 - 5.11 MIL/uL   Hemoglobin 14.0 12.0 - 15.0 g/dL   HCT 44.1 36.0 - 46.0 %   MCV 83.5 80.0 - 100.0 fL   MCH 26.5 26.0 - 34.0 pg   MCHC 31.7 30.0 - 36.0 g/dL   RDW 15.5 11.5 - 15.5 %   Platelets 300 150 - 400 K/uL   nRBC 0.0 0.0 - 0.2 %     EKG None  Radiology DG Knee Complete 4 Views Right  Result Date: 05/01/2022 CLINICAL DATA:  Right knee pain for 2 days. Previous diagnosis of arthritis. EXAM: RIGHT KNEE - COMPLETE 4+ VIEW COMPARISON:  Right knee radiographs 01/16/2017 FINDINGS: Mild medial compartment joint space narrowing with moderate to high-grade peripheral degenerative osteophytosis. Moderate peripheral lateral compartment degenerative osteophytosis. Severe patellofemoral joint space narrowing and peripheral osteophytosis. Tiny joint effusion. No acute fracture or dislocation. IMPRESSION: Severe patellofemoral, mild-to-moderate medial, and mild lateral compartment osteoarthritis. Electronically Signed   By: Yvonne Kendall M.D.   On: 05/01/2022 10:35    Procedures Procedures    Medications Ordered in ED Medications  - No data to display  ED Course/ Medical Decision Making/ A&P                           Medical Decision Making Problems Addressed: Acute pain of right knee: acute illness or injury Elevated blood pressure reading: acute illness or injury that poses a threat to life or bodily  functions Primary osteoarthritis of right knee: acute illness or injury that poses a threat to life or bodily functions  Amount and/or Complexity of Data Reviewed External Data Reviewed: notes. Labs: ordered. Decision-making details documented in ED Course. Radiology: ordered and independent interpretation performed. Decision-making details documented in ED Course.  Risk Prescription drug management.   Iv ns. Continuous pulse ox and cardiac monitoring. Labs ordered/sent. Imaging ordered.   Diff dx includes ess htn, uncontrolled htn, aki, osteoarthritis knee, etc. Disposition decision including possible need for admission considered if aki on labs, etc - will get labs and reassess dispo decision.   Reviewed nursing notes and prior charts for additional history. External reports reviewed. A  Cardiac monitor: sinus rhythm, rate 70.  Labs reviewed/interpreted by me - wbc normal. Chem normal.   Xrays reviewed/interpreted by me - no fx, degen changes.   No meds pta, does not have to drive home/has ride. Percocet 1 po.   Discussed high bp and need for pcp f/u.  Return precautions provided.            Final Clinical Impression(s) / ED Diagnoses Final diagnoses:  Acute pain of right knee  Primary osteoarthritis of right knee  Elevated blood pressure reading    Rx / DC Orders ED Discharge Orders          Ordered    traMADol (ULTRAM) 50 MG tablet  Every 6 hours PRN        05/01/22 1052    amLODipine (NORVASC) 5 MG tablet  Daily        05/01/22 1053              Lajean Saver, MD 05/01/22 1056

## 2022-05-30 IMAGING — CR DG KNEE COMPLETE 4+V*R*
4 series · 4 of 4 positions shown · non-contrast
Comparison: Right knee radiographs 01/16/2017

CLINICAL DATA: Right knee pain for 2 days. Previous diagnosis of
arthritis.

EXAM:
RIGHT KNEE - COMPLETE 4+ VIEW

[t knee ap right]
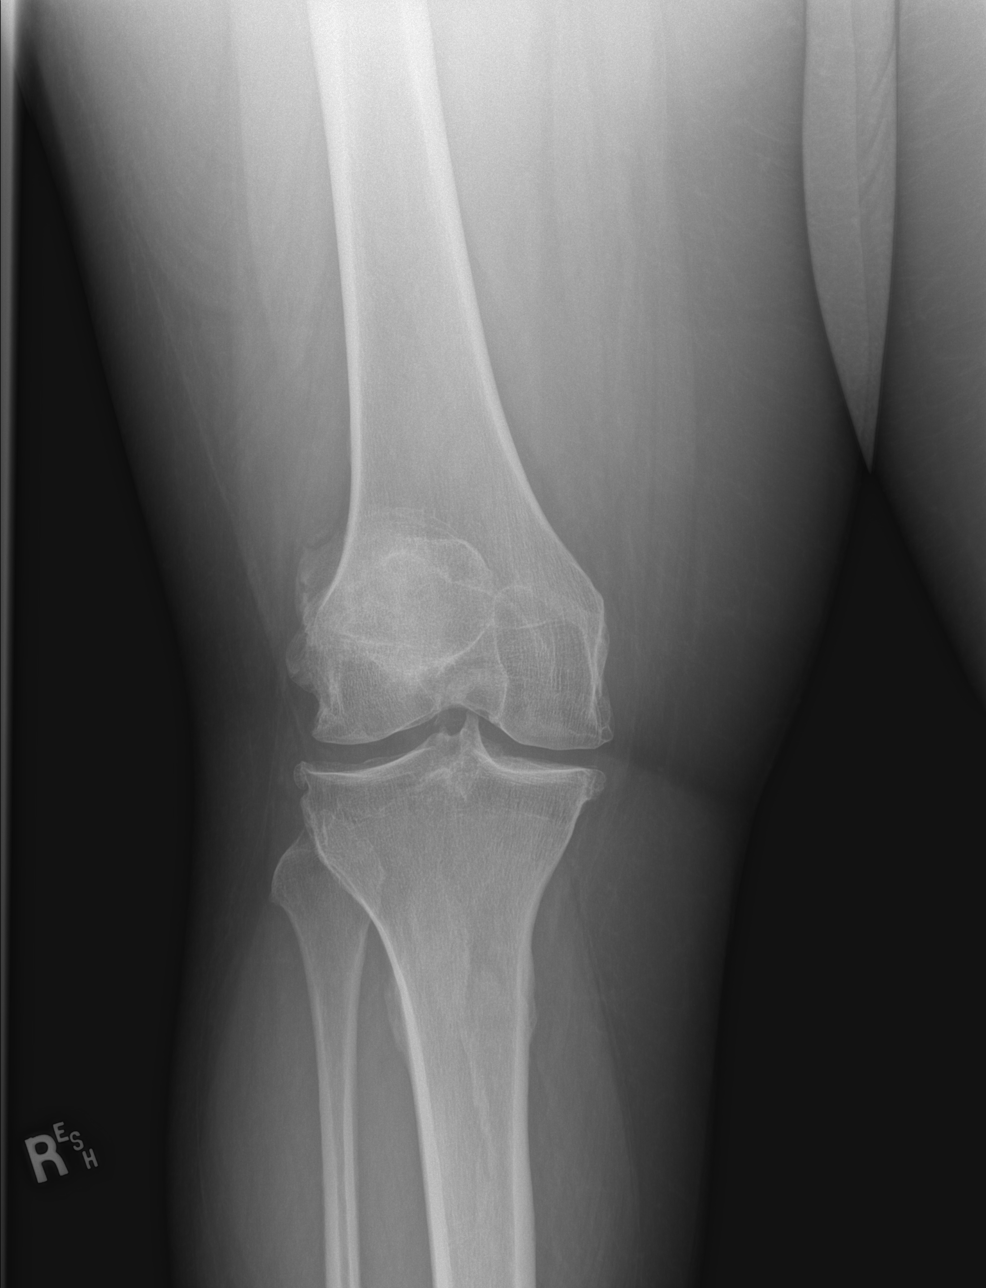

[t knee obl right (1 of 2)]
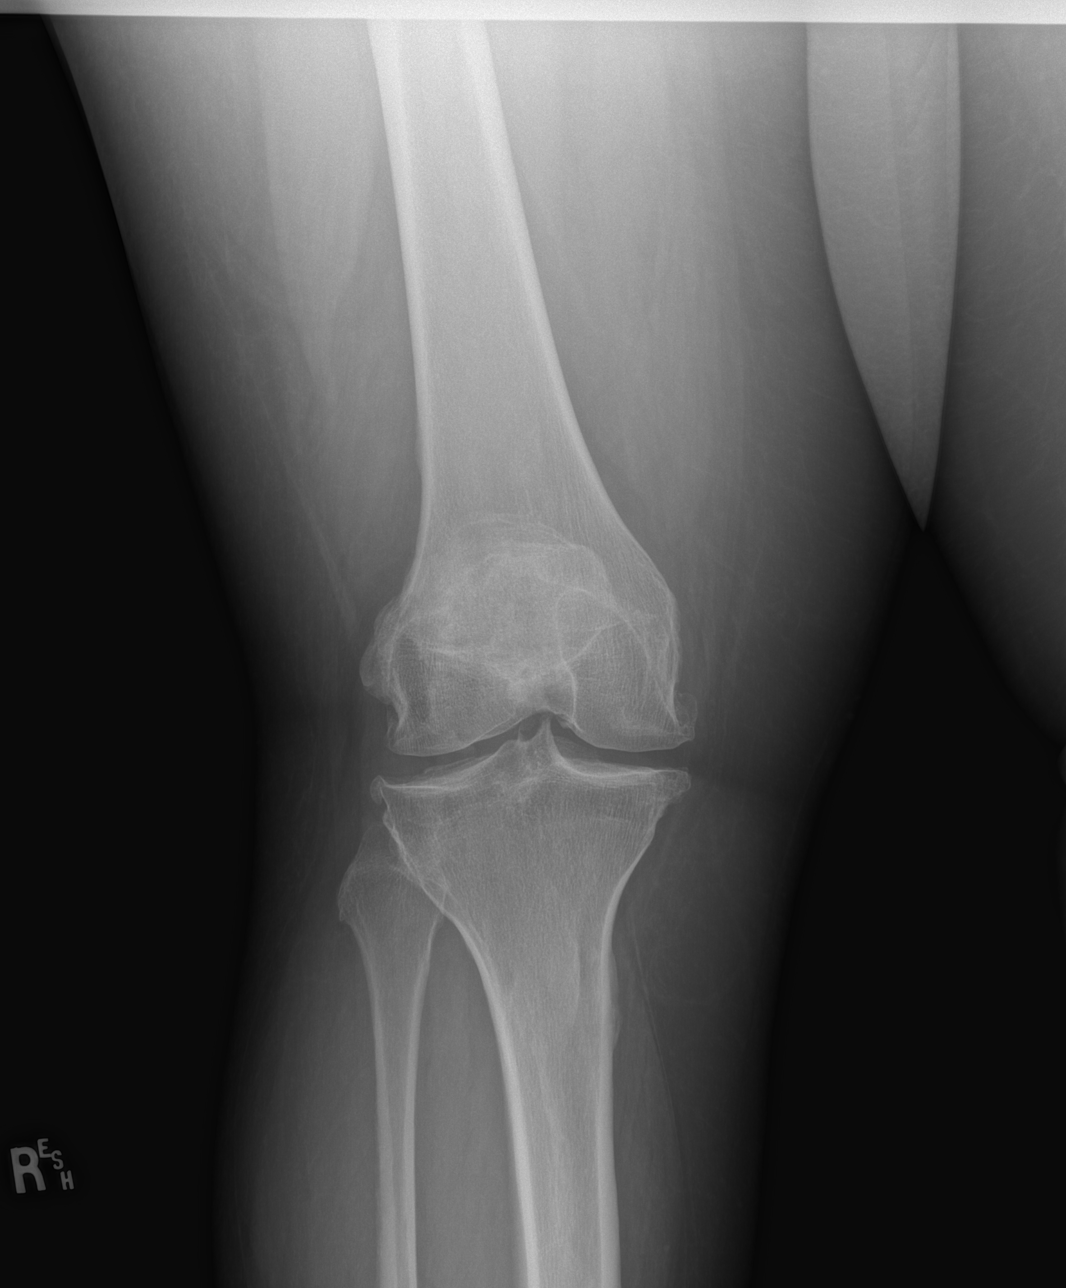

[t knee obl right (2 of 2)]
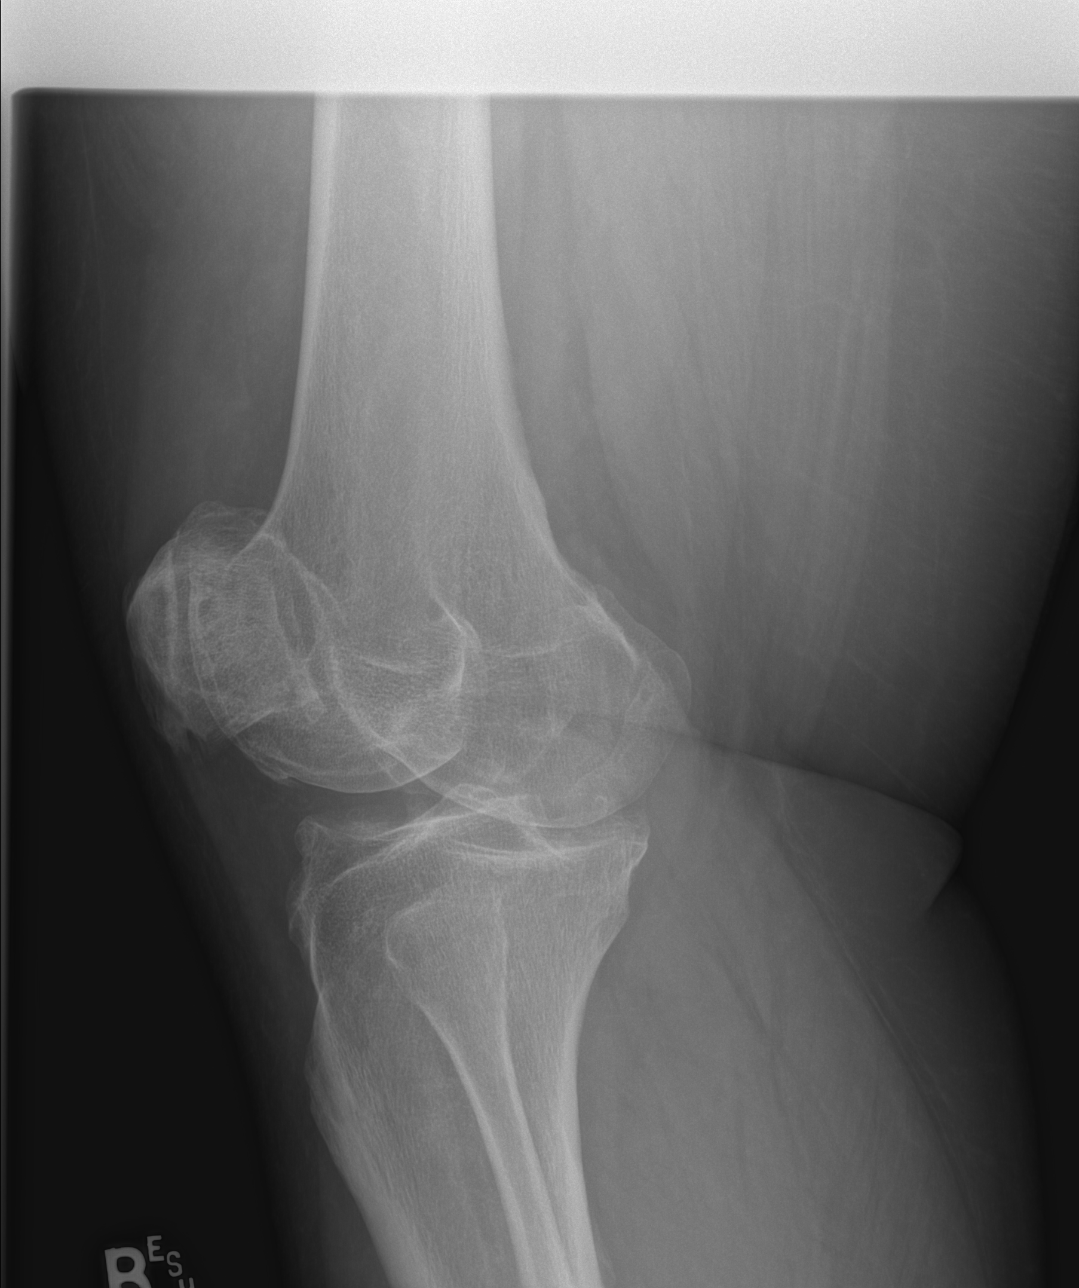

[t knee lat right]
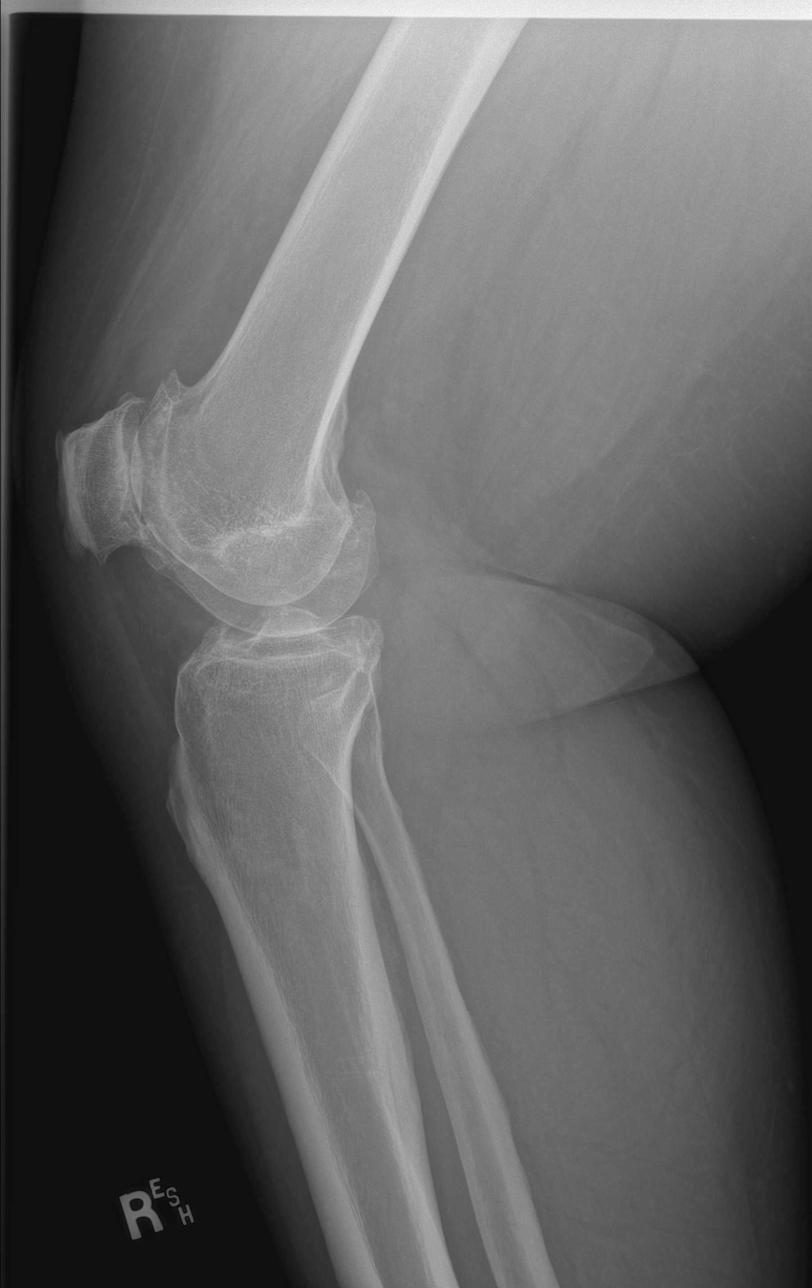

[4 of 4 positions shown; findings below may reference images not displayed]

FINDINGS: Mild medial compartment joint space narrowing with moderate to
high-grade peripheral degenerative osteophytosis. Moderate
peripheral lateral compartment degenerative osteophytosis. Severe
patellofemoral joint space narrowing and peripheral osteophytosis.
Tiny joint effusion. No acute fracture or dislocation.
IMPRESSION: Severe patellofemoral, mild-to-moderate medial, and mild lateral
compartment osteoarthritis.

## 2022-06-28 ENCOUNTER — Other Ambulatory Visit: Payer: Self-pay

## 2022-06-28 ENCOUNTER — Emergency Department (HOSPITAL_COMMUNITY): Payer: Commercial Managed Care - HMO

## 2022-06-28 ENCOUNTER — Emergency Department (HOSPITAL_COMMUNITY)
Admission: EM | Admit: 2022-06-28 | Discharge: 2022-06-28 | Disposition: A | Discharge status: Home / Self Care | Payer: Commercial Managed Care - HMO | Attending: Emergency Medicine | Admitting: Emergency Medicine

## 2022-06-28 ENCOUNTER — Encounter (HOSPITAL_COMMUNITY): Payer: Self-pay

## 2022-06-28 ENCOUNTER — Encounter: Payer: Self-pay | Admitting: Gynecologic Oncology

## 2022-06-28 DIAGNOSIS — Z91010 Allergy to peanuts: Secondary | ICD-10-CM | POA: Diagnosis not present

## 2022-06-28 DIAGNOSIS — N83209 Unspecified ovarian cyst, unspecified side: Secondary | ICD-10-CM | POA: Diagnosis not present

## 2022-06-28 DIAGNOSIS — N838 Other noninflammatory disorders of ovary, fallopian tube and broad ligament: Secondary | ICD-10-CM

## 2022-06-28 DIAGNOSIS — R1084 Generalized abdominal pain: Secondary | ICD-10-CM

## 2022-06-28 LAB — COMPREHENSIVE METABOLIC PANEL
ALT: 13 U/L (ref 0–44)
AST: 15 U/L (ref 15–41)
Albumin: 4.1 g/dL (ref 3.5–5.0)
Alkaline Phosphatase: 65 U/L (ref 38–126)
Anion gap: 10 (ref 5–15)
BUN: 10 mg/dL (ref 6–20)
CO2: 20 mmol/L — ABNORMAL LOW (ref 22–32)
Calcium: 9 mg/dL (ref 8.9–10.3)
Chloride: 110 mmol/L (ref 98–111)
Creatinine, Ser: 0.78 mg/dL (ref 0.44–1.00)
GFR, Estimated: >60 mL/min (ref >60)
Glucose, Bld: 91 mg/dL (ref 70–99)
Potassium: 3.7 mmol/L (ref 3.5–5.1)
Sodium: 140 mmol/L (ref 135–145)
Total Bilirubin: 1.1 mg/dL (ref 0.3–1.2)
Total Protein: 8.4 g/dL — ABNORMAL HIGH (ref 6.5–8.1)

## 2022-06-28 LAB — CBC
HCT: 41.4 % (ref 36.0–46.0)
Hemoglobin: 13.6 g/dL (ref 12.0–15.0)
MCH: 26.8 pg (ref 26.0–34.0)
MCHC: 32.9 g/dL (ref 30.0–36.0)
MCV: 81.7 fL (ref 80.0–100.0)
Platelets: 331 10*3/uL (ref 150–400)
RBC: 5.07 MIL/uL (ref 3.87–5.11)
RDW: 15 % (ref 11.5–15.5)
WBC: 10.9 10*3/uL — ABNORMAL HIGH (ref 4.0–10.5)
nRBC: 0 % (ref 0.0–0.2)

## 2022-06-28 LAB — URINALYSIS, ROUTINE W REFLEX MICROSCOPIC
Bilirubin Urine: NEGATIVE
Glucose, UA: NEGATIVE mg/dL
Hgb urine dipstick: NEGATIVE
Ketones, ur: 20 mg/dL — AB
Nitrite: NEGATIVE
Protein, ur: NEGATIVE mg/dL
Specific Gravity, Urine: 1.018 (ref 1.005–1.030)
pH: 5 (ref 5.0–8.0)

## 2022-06-28 LAB — PREGNANCY, URINE: Preg Test, Ur: NEGATIVE

## 2022-06-28 LAB — LIPASE, BLOOD: Lipase: 20 U/L (ref 11–51)

## 2022-06-28 MED ORDER — IOHEXOL 300 MG/ML  SOLN
100.0000 mL | Freq: Once | INTRAMUSCULAR | Status: AC | PRN
  Administered 2022-06-28: 100 mL via INTRAVENOUS

## 2022-06-28 MED ORDER — ONDANSETRON 4 MG PO TBDP: 4.0000 mg | ORAL_TABLET | Freq: Three times a day (TID) | ORAL | 0 refills | Status: DC | PRN

## 2022-06-28 MED ORDER — ONDANSETRON HCL 4 MG/2ML IJ SOLN
4.0000 mg | Freq: Once | INTRAMUSCULAR | Status: AC
  Administered 2022-06-28: 4 mg via INTRAVENOUS
  Filled 2022-06-28: qty 2

## 2022-06-28 MED ORDER — SODIUM CHLORIDE (PF) 0.9 % IJ SOLN
INTRAMUSCULAR | Status: AC
  Filled 2022-06-28: qty 50

## 2022-06-28 MED ORDER — CIPROFLOXACIN HCL 500 MG PO TABS: 500.0000 mg | ORAL_TABLET | Freq: Two times a day (BID) | ORAL | 0 refills | Status: DC

## 2022-06-28 MED ORDER — OXYCODONE-ACETAMINOPHEN 5-325 MG PO TABS
1.0000 | ORAL_TABLET | Freq: Once | ORAL | Status: AC
  Administered 2022-06-28: 1 via ORAL
  Filled 2022-06-28: qty 1

## 2022-06-28 MED ORDER — HYDROMORPHONE HCL 1 MG/ML IJ SOLN
1.0000 mg | Freq: Once | INTRAMUSCULAR | Status: AC
  Administered 2022-06-28: 1 mg via INTRAVENOUS
  Filled 2022-06-28: qty 1

## 2022-06-28 MED ORDER — NAPROXEN 500 MG PO TABS: 500.0000 mg | ORAL_TABLET | Freq: Two times a day (BID) | ORAL | 0 refills | Status: DC

## 2022-06-28 MED ORDER — SODIUM CHLORIDE 0.9 % IV BOLUS
1000.0000 mL | Freq: Once | INTRAVENOUS | Status: AC
  Administered 2022-06-28: 1000 mL via INTRAVENOUS

## 2022-06-28 NOTE — ED Provider Triage Note (Signed)
Emergency Medicine Provider Triage Evaluation Note  Kristin Boyd , a 41 y.o. female  was evaluated in triage.  Patient complains of lower abdominal and back pain for the past few days.  Acutely worsened today.  Worst pain of her life.  Has a history of ovarian cyst  Review of Systems  Positive: Severe abdominal pain and pelvic pain Negative:   Physical Exam  BP (!) 166/100 (BP Location: Left Arm)   Pulse 85   Temp 98.6 F (37 C) (Oral)   Resp 20   Ht '5\' 9"'$  (1.753 m)   Wt (!) 152 kg   SpO2 96%   BMI 49.47 kg/m  Gen:   Awake, no distress   Resp:  Normal effort  MSK:   Moves extremities without difficulty  Other:  Severe abdominal pain  Medical Decision Making  Medically screening exam initiated at 2:18 PM.  Appropriate orders placed.  Kristin Boyd was informed that the remainder of the evaluation will be completed by another provider, this initial triage assessment does not replace that evaluation, and the importance of remaining in the ED until their evaluation is complete.  Percocet and ultrasound ordered to rule out torsion or ruptured cyst.  March [place] reading: 11.5 cm complex hemorrhagic cystic lesion in the left adnexa, which shows increase in size since prior CT in 2020. Left hydrosalpinx also noted which shows communication with this lesion. 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 Cystic ovarian neoplasm is considered less likely, but cannot be excluded.   Hematometros, which may be due to previous endometrial ablation or C-section.   Rhae Hammock, PA-C 06/28/22 1424

## 2022-06-28 NOTE — ED Triage Notes (Addendum)
Pt arrives via EMS from home. Pt c/o abdominal pain for the past 3 days. Denies n/v/d.

## 2022-06-28 NOTE — ED Provider Notes (Signed)
[facility] DEPT Provider Note   CSN: [id number] Arrival date & time: 06/28/22  1338     History  Chief Complaint  Patient presents with   Abdominal Pain    Kristin Boyd is a 41 y.o. female.  Patient with history of chronic abdominal pain in setting of ovarian cyst and endometriosis presenting with diffuse abdominal pain for the past 3 days.  She reports the pain radiates to her lower back and hips bilaterally similar to the pain she had previously.  She does not take anything for it at home.  She has been seen by [facility]necology [facility] in the past and told she may need to have surgery for her endometriosis but has not done this yet.  She recently changed insurances and no longer sees a gynecologist or the pain clinic.  States pain is more severe than usual but there are certain days she does not have pain.  She denies any vomiting.  She denies any fever.  No pain with urination or blood in the urine.  No vaginal bleeding or discharge.  Reports not having a menstrual cycle for many years. Moving her bowels normally.  Still able to eat and drink.  No fever, cough, nausea or vomiting.  Still has appendix but no gallbladder.  The history is provided by the patient.  Abdominal Pain Associated symptoms: no chest pain, no cough, no dysuria, no fever, no hematuria, no nausea, no shortness of breath, no vaginal bleeding, no vaginal discharge and no vomiting        Home Medications Prior to Admission medications   Medication Sig Start Date End Date Taking? Authorizing Provider  acetaminophen (TYLENOL) 500 MG tablet Take 1,000 mg by mouth every 6 (six) hours as needed for moderate pain. Patient not taking: Reported on 02/03/2022    [provider]  amLODipine (NORVASC) 5 MG tablet Take 1 tablet (5 mg total) by mouth daily. 05/01/22   Lajean Saver, MD  Elagolix Sodium (ORILISSA) 150 MG TABS Take 1 tablet by mouth daily. Patient not  taking: Reported on 02/03/2022 02/03/22   Lafonda Mosses, MD  hydrOXYzine (ATARAX) 25 MG tablet Take 25 mg by mouth every 6 (six) hours as needed. 11/27/21   [provider]  ibuprofen (ADVIL) 800 MG tablet Take 800 mg by mouth 4 (four) times daily as needed. 01/20/22   [provider]  norethindrone (AYGESTIN) 5 MG tablet Take 1 tablet (5 mg total) by mouth daily. 11/20/21   Lafonda Mosses, MD  oxyCODONE-acetaminophen (PERCOCET/ROXICET) 5-325 MG tablet Take 1-2 tablets by mouth every 6 (six) hours as needed for severe pain. 12/27/21   Lacretia Leigh, MD  senna-docusate (SENOKOT-S) 8.6-50 MG tablet Take 2 tablets by mouth at bedtime. For AFTER surgery, do not take if having diarrhea Patient taking differently: Take 2 tablets by mouth as needed. For AFTER surgery, do not take if having diarrhea 10/23/21   Cross, Lenna Sciara D, NP  traMADol (ULTRAM) 50 MG tablet Take 1 tablet (50 mg total) by mouth every 6 (six) hours as needed. 05/01/22   Lajean Saver, MD      Allergies    Peanut (diagnostic) and Penicillins    Review of Systems   Review of Systems  Constitutional:  Negative for activity change, appetite change and fever.  HENT:  Negative for congestion and rhinorrhea.   Respiratory:  Negative for cough, chest tightness and shortness of breath.   Cardiovascular:  Negative for chest  pain.  Gastrointestinal:  Positive for abdominal pain. Negative for nausea and vomiting.  Genitourinary:  Negative for dysuria, hematuria, vaginal bleeding and vaginal discharge.  Musculoskeletal:  Positive for back pain. Negative for arthralgias and myalgias.  Skin:  Negative for rash.  Neurological:  Negative for dizziness, weakness and headaches.   all other systems are negative except as noted in the HPI and PMH.    Physical Exam Updated Vital Signs BP (!) 166/100 (BP Location: Left Arm)   Pulse 85   Temp 98.6 F (37 C) (Oral)   Resp 20   Ht '5\' 9"'$  (1.753 m)   Wt (!) 152 kg   SpO2  96%   BMI 49.47 kg/m  Physical Exam Vitals and nursing note reviewed.  Constitutional:      General: She is not in acute distress.    Appearance: She is well-developed. She is obese.  HENT:     Head: Normocephalic and atraumatic.     Mouth/Throat:     Pharynx: No oropharyngeal exudate.  Eyes:     Conjunctiva/sclera: Conjunctivae normal.     Pupils: Pupils are equal, round, and reactive to light.  Neck:     Comments: No meningismus. Cardiovascular:     Rate and Rhythm: Normal rate and regular rhythm.     Heart sounds: Normal heart sounds. No murmur heard. Pulmonary:     Effort: Pulmonary effort is normal. No respiratory distress.     Breath sounds: Normal breath sounds.  Abdominal:     Palpations: Abdomen is soft.     Tenderness: There is abdominal tenderness. There is no guarding or rebound.     Comments: Diffuse tenderness, no guarding or rebound.  No right lower quadrant tenderness  Musculoskeletal:        General: No tenderness. Normal range of motion.     Cervical back: Normal range of motion and neck supple.  Skin:    General: Skin is warm.  Neurological:     Mental Status: She is alert and oriented to person, place, and time.     Cranial Nerves: No cranial nerve deficit.     Motor: No abnormal muscle tone.     Coordination: Coordination normal.     Comments:  5/5 strength throughout. CN 2-12 intact.Equal grip strength.   Psychiatric:        Behavior: Behavior normal.     ED Results / Procedures / Treatments   Labs (all labs ordered are listed, but only abnormal results are displayed) Labs Reviewed  COMPREHENSIVE METABOLIC PANEL - Abnormal; Notable for the following components:      Result Value   CO2 20 (*)    Total Protein 8.4 (*)    All other components within normal limits  CBC - Abnormal; Notable for the following components:   WBC 10.9 (*)    All other components within normal limits  URINALYSIS, ROUTINE W REFLEX MICROSCOPIC - Abnormal; Notable for  the following components:   APPearance HAZY (*)    Ketones, ur 20 (*)    Leukocytes,Ua TRACE (*)    Bacteria, UA MANY (*)    All other components within normal limits  LIPASE, BLOOD  PREGNANCY, URINE  HCG, SERUM, QUALITATIVE    EKG None  Radiology CT ABDOMEN PELVIS W CONTRAST  Result Date: 06/28/2022 CLINICAL DATA:  Abdominal pain, acute nonlocalized. Abdominal pain for the past 3 days. EXAM: CT ABDOMEN AND PELVIS WITH CONTRAST TECHNIQUE: Multidetector CT imaging of the abdomen and pelvis was performed using  the standard protocol following bolus administration of intravenous contrast. RADIATION DOSE REDUCTION: This exam was performed according to the departmental dose-optimization program which includes automated exposure control, adjustment of the mA and/or kV according to patient size and/or use of iterative reconstruction technique. CONTRAST:  163m OMNIPAQUE IOHEXOL 300 MG/ML  SOLN COMPARISON:  CT examination dated August 16, 2021; pelvic sonogram performed earlier on the same date. FINDINGS: Lower chest: No acute abnormality. Bibasilar subsegmental linear atelectasis. Hepatobiliary: No focal liver abnormality is seen. Status post cholecystectomy. No biliary dilatation. Pancreas: Unremarkable. No pancreatic ductal dilatation or surrounding inflammatory changes. Spleen: Normal in size without focal abnormality. Adrenals/Urinary Tract: Adrenal glands are unremarkable. No evidence of nephrolithiasis or hydronephrosis. Left upper pole calcification and focal cortical atrophy. Bladder is unremarkable. Stomach/Bowel: Stomach is within normal limits. Appendix appears normal. No evidence of bowel wall thickening, distention, or inflammatory changes. Vascular/Lymphatic: No significant vascular findings are present. No enlarged abdominal or pelvic lymph nodes. Reproductive: Complex left adnexal cystic mass measuring at least 9.0 x 11.0 x 10.6 cm. Uterus and right ovary are unremarkable. Other: No  abdominal wall hernia or abnormality. No abdominopelvic ascites. Musculoskeletal: No acute or significant osseous findings. IMPRESSION: 1. Complex cystic mass in the left adnexal region measuring 9.0 x 11.0 x 10.6 cm, have increased in size since prior CT examination of August 16, 2021. Surgical consultation for further management is recommended. 2. Bowel loops are normal in caliber. Normal appendix. No evidence of colitis or diverticulitis. 3. No evidence of nephrolithiasis or hydronephrosis. Chronic calcification and focal renal cortical atrophy of the left upper pole. Electronically Signed   By: IKeane PoliceD.O.   On: 06/28/2022 20:30   [place]orea[place]elvis Complete  Result Date: 06/28/2022 CLINICAL DATA:  Pelvic pain for several days. History of endometrial ablation. EXAM: TRANSABDOMINAL AND TRANSVAGINAL ULTRASOUND OF PELVIS DOPPLER ULTRASOUND OF OVARIES TECHNIQUE: Both transabdominal and transvaginal ultrasound examinations of the pelvis were performed. Transabdominal technique was performed for global imaging of the pelvis including uterus, ovaries, adnexal regions, and pelvic cul-de-sac. It was necessary to proceed with endovaginal exam following the transabdominal exam to visualize the uterus, endometrium and ovaries. Color and duplex Doppler ultrasound was utilized to evaluate blood flow to the ovaries. COMPARISON:  None Available. FINDINGS: Uterus Measurements: 11.2 x 5.3 x 6.1 cm = volume: 209.7 mL. No fibroids or other mass visualized. Endometrium Thickness: 10 mm. Fluid noted within the endometrial cavity. No focal abnormality visualized. Right ovary Measurements: 5.1 x 2.5 x 2.4 = volume: 16.3 mL. Normal appearance/no adnexal mass. Left ovary Measurements: 11.0 x 9.2 x 9.8 cm = volume: 520 cm mL. There is a large cyst within the left ovary measuring 7.6 x 7.9 8.8 cm. This contains diffuse level internal echoes. Pulsed Doppler evaluation of both ovaries demonstrates normal low-resistance arterial and venous  waveforms. Other findings No abnormal free fluid. * exam detail is diminished due to patient body habitus as well as overlying bowel gas. IMPRESSION: 1. No signs of ovarian torsion. 2. There is a large cyst containing diffuse low-level internal echoes within the left ovary with a maximum diameter of 8.8 cm. This is most likely a benign hemorrhagic cyst. Follow-up ultrasound in 2-3 months is recommended. Follow-up with gynecology (as clinically indicated for management.) Electronically Signed   By: TKerby MoorsM.D.   On: 06/28/2022 16:21   [place]oreaTransvaginal Non-OB  Result Date: 06/28/2022 CLINICAL DATA:  Pelvic pain for several days. History of endometrial ablation. EXAM: TRANSABDOMINAL AND TRANSVAGINAL ULTRASOUND OF PELVIS  DOPPLER ULTRASOUND OF OVARIES TECHNIQUE: Both transabdominal and transvaginal ultrasound examinations of the pelvis were performed. Transabdominal technique was performed for global imaging of the pelvis including uterus, ovaries, adnexal regions, and pelvic cul-de-sac. It was necessary to proceed with endovaginal exam following the transabdominal exam to visualize the uterus, endometrium and ovaries. Color and duplex Doppler ultrasound was utilized to evaluate blood flow to the ovaries. COMPARISON:  None Available. FINDINGS: Uterus Measurements: 11.2 x 5.3 x 6.1 cm = volume: 209.7 mL. No fibroids or other mass visualized. Endometrium Thickness: 10 mm. Fluid noted within the endometrial cavity. No focal abnormality visualized. Right ovary Measurements: 5.1 x 2.5 x 2.4 = volume: 16.3 mL. Normal appearance/no adnexal mass. Left ovary Measurements: 11.0 x 9.2 x 9.8 cm = volume: 520 cm mL. There is a large cyst within the left ovary measuring 7.6 x 7.9 8.8 cm. This contains diffuse level internal echoes. Pulsed Doppler evaluation of both ovaries demonstrates normal low-resistance arterial and venous waveforms. Other findings No abnormal free fluid. * exam detail is diminished due to patient body  habitus as well as overlying bowel gas. IMPRESSION: 1. No signs of ovarian torsion. 2. There is a large cyst containing diffuse low-level internal echoes within the left ovary with a maximum diameter of 8.8 cm. This is most likely a benign hemorrhagic cyst. Follow-up ultrasound in 2-3 months is recommended. Follow-up with gynecology (as clinically indicated for management.) Electronically Signed   By: Kerby Moors M.D.   On: 06/28/2022 16:21   US ABDOMINAL PELVIC ART/VENT FLOW DOPPLER  Result Date: 06/28/2022 CLINICAL DATA:  Pelvic pain for several days. History of endometrial ablation. EXAM: TRANSABDOMINAL AND TRANSVAGINAL ULTRASOUND OF PELVIS DOPPLER ULTRASOUND OF OVARIES TECHNIQUE: Both transabdominal and transvaginal ultrasound examinations of the pelvis were performed. Transabdominal technique was performed for global imaging of the pelvis including uterus, ovaries, adnexal regions, and pelvic cul-de-sac. It was necessary to proceed with endovaginal exam following the transabdominal exam to visualize the uterus, endometrium and ovaries. Color and duplex Doppler ultrasound was utilized to evaluate blood flow to the ovaries. COMPARISON:  None Available. FINDINGS: Uterus Measurements: 11.2 x 5.3 x 6.1 cm = volume: 209.7 mL. No fibroids or other mass visualized. Endometrium Thickness: 10 mm. Fluid noted within the endometrial cavity. No focal abnormality visualized. Right ovary Measurements: 5.1 x 2.5 x 2.4 = volume: 16.3 mL. Normal appearance/no adnexal mass. Left ovary Measurements: 11.0 x 9.2 x 9.8 cm = volume: 520 cm mL. There is a large cyst within the left ovary measuring 7.6 x 7.9 8.8 cm. This contains diffuse level internal echoes. Pulsed Doppler evaluation of both ovaries demonstrates normal low-resistance arterial and venous waveforms. Other findings No abnormal free fluid. * exam detail is diminished due to patient body habitus as well as overlying bowel gas. IMPRESSION: 1. No signs of ovarian  torsion. 2. There is a large cyst containing diffuse low-level internal echoes within the left ovary with a maximum diameter of 8.8 cm. This is most likely a benign hemorrhagic cyst. Follow-up ultrasound in 2-3 months is recommended. Follow-up with gynecology (as clinically indicated for management.) Electronically Signed   By: Kerby Moors M.D.   On: 06/28/2022 16:21    Procedures Procedures    Medications Ordered in ED Medications  HYDROmorphone (DILAUDID) injection 1 mg (has no administration in time range)  ondansetron (ZOFRAN) injection 4 mg (has no administration in time range)  sodium chloride 0.9 % bolus 1,000 mL (has no administration in time range)  oxyCODONE-acetaminophen (PERCOCET/ROXICET) 5-325  MG per tablet 1 tablet (1 tablet Oral Given 06/28/22 1424)    ED Course/ Medical Decision Making/ A&P                           Medical Decision Making Amount and/or Complexity of Data Reviewed Labs: ordered. Decision-making details documented in ED Course. Radiology: ordered and independent interpretation performed. Decision-making details documented in ED Course. ECG/medicine tests: ordered and independent interpretation performed. Decision-making details documented in ED Course.  Risk Prescription drug management.  Acute on chronic abdominal pain in setting of endometriosis and ovarian cysts.  Abdomen is soft without peritoneal signs.  Vitals are stable.  Imaging obtained in triage shows no evidence of ovarian torsion.  Does show a large ovarian cyst on the left approximately 8 cm concerning for hemorrhagic cyst. Results reviewed and interpreted by me  Suspect acute on chronic abdominal pain. D/w Dr. Harolyn Rutherford of gynecology.  She agrees with outpatient follow-up and pain control with repeat ultrasound in 2 to 3 months.  No indication for emergent intervention today.  Abdomen is soft and nontender.  Patient will tolerate p.o.  No vomiting.  Results discussed with her regarding  her CT scan and ultrasound.  Results are reviewed and interpreted by me.  Discussed follow-up with gynecology for reassessment of her ovarian cyst and possible surgical removal. Labs appear to be stable.  Will place on antibiotics given her pyuria and lower abdominal pain. Return precautions discussed.         Final Clinical Impression(s) / ED Diagnoses Final diagnoses:  Generalized abdominal pain  Ovarian mass    Rx / DC Orders ED Discharge Orders     None         Jadelynn Boylan, Annie Main, MD 06/28/22 2340

## 2022-06-28 NOTE — ED Notes (Signed)
Pt reports she prefers to have her blood work drawn when an IV is started instead of having labs drawn by phlebotomy

## 2022-06-28 NOTE — Discharge Instructions (Addendum)
Your ultrasound and CT scan are concerning for a large cyst to your left ovary.  This likely needs to be removed by your gynecologist.  Return to the ED with worsening pain, fever, vomiting, able to eat or drink or other concerns.  You should have another ultrasound in 2 months to ensure this cyst is not changing but you should ideally have it removed before then.

## 2022-06-28 NOTE — ED Notes (Signed)
Pt given apple juice over ice with saltine crackers. Tolerating well.

## 2022-07-24 ENCOUNTER — Emergency Department (HOSPITAL_COMMUNITY)
Admission: EM | Admit: 2022-07-24 | Discharge: 2022-07-24 | Disposition: A | Discharge status: Home / Self Care | Payer: Commercial Managed Care - HMO | Attending: Emergency Medicine | Admitting: Emergency Medicine

## 2022-07-24 ENCOUNTER — Emergency Department (HOSPITAL_COMMUNITY): Payer: Commercial Managed Care - HMO

## 2022-07-24 ENCOUNTER — Other Ambulatory Visit: Payer: Self-pay

## 2022-07-24 DIAGNOSIS — N83202 Unspecified ovarian cyst, left side: Secondary | ICD-10-CM

## 2022-07-24 DIAGNOSIS — D72829 Elevated white blood cell count, unspecified: Secondary | ICD-10-CM | POA: Insufficient documentation

## 2022-07-24 DIAGNOSIS — Z91010 Allergy to peanuts: Secondary | ICD-10-CM | POA: Diagnosis not present

## 2022-07-24 DIAGNOSIS — R109 Unspecified abdominal pain: Secondary | ICD-10-CM | POA: Diagnosis present

## 2022-07-24 DIAGNOSIS — R1030 Lower abdominal pain, unspecified: Secondary | ICD-10-CM

## 2022-07-24 LAB — CBC WITH DIFFERENTIAL/PLATELET
Abs Immature Granulocytes: 0.04 10*3/uL (ref 0.00–0.07)
Basophils Absolute: 0.1 10*3/uL (ref 0.0–0.1)
Basophils Relative: 0 %
Eosinophils Absolute: 0.1 10*3/uL (ref 0.0–0.5)
Eosinophils Relative: 1 %
HCT: 41.7 % (ref 36.0–46.0)
Hemoglobin: 13.9 g/dL (ref 12.0–15.0)
Immature Granulocytes: 0 %
Lymphocytes Relative: 17 %
Lymphs Abs: 2 10*3/uL (ref 0.7–4.0)
MCH: 27.1 pg (ref 26.0–34.0)
MCHC: 33.3 g/dL (ref 30.0–36.0)
MCV: 81.3 fL (ref 80.0–100.0)
Monocytes Absolute: 0.7 10*3/uL (ref 0.1–1.0)
Monocytes Relative: 6 %
Neutro Abs: 8.9 10*3/uL — ABNORMAL HIGH (ref 1.7–7.7)
Neutrophils Relative %: 76 %
Platelets: 282 10*3/uL (ref 150–400)
RBC: 5.13 MIL/uL — ABNORMAL HIGH (ref 3.87–5.11)
RDW: 15 % (ref 11.5–15.5)
WBC: 11.8 10*3/uL — ABNORMAL HIGH (ref 4.0–10.5)
nRBC: 0 % (ref 0.0–0.2)

## 2022-07-24 LAB — COMPREHENSIVE METABOLIC PANEL
ALT: 15 U/L (ref 0–44)
AST: 15 U/L (ref 15–41)
Albumin: 4.4 g/dL (ref 3.5–5.0)
Alkaline Phosphatase: 63 U/L (ref 38–126)
Anion gap: 10 (ref 5–15)
BUN: 14 mg/dL (ref 6–20)
CO2: 20 mmol/L — ABNORMAL LOW (ref 22–32)
Calcium: 9 mg/dL (ref 8.9–10.3)
Chloride: 111 mmol/L (ref 98–111)
Creatinine, Ser: 0.92 mg/dL (ref 0.44–1.00)
GFR, Estimated: >60 mL/min (ref >60)
Glucose, Bld: 96 mg/dL (ref 70–99)
Potassium: 3.7 mmol/L (ref 3.5–5.1)
Sodium: 141 mmol/L (ref 135–145)
Total Bilirubin: 1.7 mg/dL — ABNORMAL HIGH (ref 0.3–1.2)
Total Protein: 8.2 g/dL — ABNORMAL HIGH (ref 6.5–8.1)

## 2022-07-24 LAB — I-STAT BETA HCG BLOOD, ED (MC, WL, AP ONLY): I-stat hCG, quantitative: <5 m[IU]/mL (ref <5)

## 2022-07-24 LAB — URINALYSIS, ROUTINE W REFLEX MICROSCOPIC
Bacteria, UA: NONE SEEN
Bilirubin Urine: NEGATIVE
Glucose, UA: NEGATIVE mg/dL
Hgb urine dipstick: NEGATIVE
Ketones, ur: 5 mg/dL — AB
Leukocytes,Ua: NEGATIVE
Nitrite: NEGATIVE
Protein, ur: 30 mg/dL — AB
Specific Gravity, Urine: 1.043 — ABNORMAL HIGH (ref 1.005–1.030)
pH: 5 (ref 5.0–8.0)

## 2022-07-24 LAB — LIPASE, BLOOD: Lipase: 20 U/L (ref 11–51)

## 2022-07-24 MED ORDER — HYDROMORPHONE HCL 2 MG/ML IJ SOLN
1.0000 mg | Freq: Once | INTRAMUSCULAR | Status: AC
  Administered 2022-07-24: 1 mg via INTRAVENOUS
  Filled 2022-07-24: qty 1

## 2022-07-24 MED ORDER — ONDANSETRON HCL 4 MG PO TABS: 4.0000 mg | ORAL_TABLET | Freq: Two times a day (BID) | ORAL | 0 refills | Status: AC

## 2022-07-24 MED ORDER — OXYCODONE-ACETAMINOPHEN 5-325 MG PO TABS: 1.0000 | ORAL_TABLET | ORAL | 0 refills | Status: DC | PRN

## 2022-07-24 MED ORDER — IOHEXOL 300 MG/ML  SOLN
100.0000 mL | Freq: Once | INTRAMUSCULAR | Status: AC | PRN
  Administered 2022-07-24: 100 mL via INTRAVENOUS

## 2022-07-24 NOTE — ED Provider Notes (Signed)
[facility] DEPT Provider Note   CSN: [id number] Arrival date & time: 07/24/22  1512     History  Chief Complaint  Patient presents with   Back Pain   Abdominal Pain    Kristin Boyd is a 41 y.o. female with a past medical history of endometriosis and chronic abdominal pain presenting to the emergency room with concerns of abdominal and back pain.  Patient reports that this pain is a sharp pain that started 2 days ago.  She states that the pain suddenly came on.  Patient states that she was just laying down when the pain started.  Of note, patient was seen for back pain on 06/28/2022 at [facility] emergency room, and found to have an 8 cm ovarian cyst.  Patient was due for repeat imaging to 3 months later.  Patient reports that this pain continued to get worse, and so she decided to come to the emergency room.  She has tried naproxen for the pain with no relief.  She states she ran out of her chronic opioid medication.  She denies any chance of being pregnant.  She denies any vaginal symptoms or urinary symptoms at this time.  She denies any trauma.  She denies any incontinence.   Back Pain Associated symptoms: abdominal pain   Abdominal Pain      Home Medications Prior to Admission medications   Medication Sig Start Date End Date Taking? Authorizing Provider  ondansetron (ZOFRAN) 4 MG tablet Take 1 tablet (4 mg total) by mouth 2 (two) times daily for 30 doses. 07/24/22 08/08/22 Yes Leigh Aurora, DO  oxyCODONE-acetaminophen (PERCOCET/ROXICET) 5-325 MG tablet Take 1 tablet by mouth every 4 (four) hours as needed for severe pain. 07/24/22  Yes Tegeler, Gwenyth Allegra, MD  amLODipine (NORVASC) 5 MG tablet Take 1 tablet (5 mg total) by mouth daily. Patient not taking: Reported on 06/28/2022 05/01/22   Lajean Saver, MD  ciprofloxacin (CIPRO) 500 MG tablet Take 1 tablet (500 mg total) by mouth every 12 (twelve) hours. 06/28/22   Rancour, Annie Main, MD  Elagolix  Sodium (ORILISSA) 150 MG TABS Take 1 tablet by mouth daily. Patient not taking: Reported on 06/28/2022 02/03/22   Lafonda Mosses, MD  naproxen (NAPROSYN) 500 MG tablet Take 1 tablet (500 mg total) by mouth 2 (two) times daily with a meal. 06/28/22   Rancour, Annie Main, MD  norethindrone (AYGESTIN) 5 MG tablet Take 1 tablet (5 mg total) by mouth daily. Patient not taking: Reported on 06/28/2022 11/20/21   Lafonda Mosses, MD  ondansetron (ZOFRAN-ODT) 4 MG disintegrating tablet Take 1 tablet (4 mg total) by mouth every 8 (eight) hours as needed for nausea or vomiting. 06/28/22   Rancour, Annie Main, MD  oxyCODONE-acetaminophen (PERCOCET/ROXICET) 5-325 MG tablet Take 1-2 tablets by mouth every 6 (six) hours as needed for severe pain. Patient not taking: Reported on 06/28/2022 12/27/21   Lacretia Leigh, MD  senna-docusate (SENOKOT-S) 8.6-50 MG tablet Take 2 tablets by mouth at bedtime. For AFTER surgery, do not take if having diarrhea Patient not taking: Reported on 06/28/2022 10/23/21   Joylene John D, NP  traMADol (ULTRAM) 50 MG tablet Take 1 tablet (50 mg total) by mouth every 6 (six) hours as needed. Patient not taking: Reported on 06/28/2022 05/01/22   Lajean Saver, MD      Allergies    Peanut (diagnostic) and Penicillins    Review of Systems   Review of Systems  Gastrointestinal:  Positive for abdominal pain.  Musculoskeletal:  Positive for back pain.    Physical Exam Updated Vital Signs BP (!) 137/96   Pulse 76   Temp 98.1 F (36.7 C) (Oral)   Resp 17   Ht '5\' 9"'$  (1.753 m)   Wt (!) 152 kg   SpO2 92%   BMI 49.47 kg/m  Physical Exam  General: Patient is currently in pain. Eyes: Tearful on exam Head: Normocephalic, atraumatic  Cardio: Regular rate and rhythm, no murmurs, rubs or gallops. 2+ pulses to bilateral upper and lower extremities  Pulmonary: Clear to ausculation bilaterally with no rales, rhonchi, and crackles  Abdomen: Right upper and lower quadrant tenderness.   Normoactive bowel sounds.  No rebound or guarding. Neuro: Sensation intact to upper and lower extremities. Back: No midline back tenderness, step-off or deformity.  There is some paraspinal tenderness noted to lower lumbar region. Skin: No rashes noted  MSK: 5/5 strength to upper and lower extremities.   ED Results / Procedures / Treatments   Labs (all labs ordered are listed, but only abnormal results are displayed) Labs Reviewed  CBC WITH DIFFERENTIAL/PLATELET - Abnormal; Notable for the following components:      Result Value   WBC 11.8 (*)    RBC 5.13 (*)    Neutro Abs 8.9 (*)    All other components within normal limits  COMPREHENSIVE METABOLIC PANEL - Abnormal; Notable for the following components:   CO2 20 (*)    Total Protein 8.2 (*)    Total Bilirubin 1.7 (*)    All other components within normal limits  URINALYSIS, ROUTINE W REFLEX MICROSCOPIC - Abnormal; Notable for the following components:   Specific Gravity, Urine 1.043 (*)    Ketones, ur 5 (*)    Protein, ur 30 (*)    All other components within normal limits  LIPASE, BLOOD  I-STAT BETA HCG BLOOD, ED ([facility], [facility], [facility] ONLY)    EKG None  Radiology CT Abdomen Pelvis W Contrast  Result Date: 07/24/2022 CLINICAL DATA:  Nausea, vomiting and abdominal pain. EXAM: CT ABDOMEN AND PELVIS WITH CONTRAST TECHNIQUE: Multidetector CT imaging of the abdomen and pelvis was performed using the standard protocol following bolus administration of intravenous contrast. RADIATION DOSE REDUCTION: This exam was performed according to the departmental dose-optimization program which includes automated exposure control, adjustment of the mA and/or kV according to patient size and/or use of iterative reconstruction technique. CONTRAST:  150m OMNIPAQUE IOHEXOL 300 MG/ML  SOLN COMPARISON:  Same day pelvic ultrasound and CT abdomen pelvis June 28, 2022. FINDINGS: Lower chest: No acute abnormality. Hepatobiliary: No suspicious hepatic lesion.  Gallbladder surgically absent. Probable dropped clips along the posterior right lobe of the liver. No biliary ductal dilation. Pancreas: No pancreatic ductal dilation or evidence of acute inflammation. Spleen: No splenomegaly. Small perisplenic nodules for instance on image 21 mm/2 are similar dating back to at least 2016 and likely reflect small splenules. Adrenals/Urinary Tract: Bilateral adrenal glands appear normal. No hydronephrosis. Nonobstructive 5 mm left upper pole renal stone with overlying cortical renal scarring. Urinary bladder is unremarkable for degree of distension. Stomach/Bowel: No radiopaque enteric contrast material was administered. Stomach is unremarkable for degree of distension. No pathologic dilation of large or small bowel. Vascular/Lymphatic: Normal caliber abdominal aorta. No pathologically enlarged abdominal or pelvic lymph nodes. Reproductive: Multiloculated lesion in the left adnexa measures 10.8 x 9.2 cm on image 58/2 previously 11.1 x 9.0 cm previously evaluated by pelvic MRI and most consistent with an endometrioma with hematosalpinx. Heterogeneous fluid is again seen  in a distended endometrial cavity consistent with hematometros. Other: Mild mesenteric and omental stranding has been slowly increasing over multiple prior imaging studies is favored inflammatory and possibly reflecting sequela of endometriosis given the findings in the left adnexa and uterus. Musculoskeletal: No acute osseous abnormality. IMPRESSION: 1. Similar size and appearance of the left adnexal lesion previously characterized as an endometrioma and hematosalpinx on pelvic MRI. 2. Heterogeneous fluid is again seen in the endometrial canal and was previously characterized as hematometros on prior MRI pelvis possibly reflecting sequela of endometrial implants endometrial ablation or C-section. 3. Mild mesenteric and omental stranding has been subtly and slowly increasing over multiple prior imaging studies common  nonspecific but favored inflammatory possibly reflecting sequela of endometriosis given the findings in the left adnexa and uterus. 4. Cortical calcification and focal renal scarring in the left kidney. Electronically Signed   By: Dahlia Bailiff M.D.   On: 07/24/2022 18:46   US Pelvis Complete  Result Date: 07/24/2022 CLINICAL DATA:  Abdominal pain EXAM: TRANSABDOMINAL AND TRANSVAGINAL ULTRASOUND OF PELVIS DOPPLER ULTRASOUND OF OVARIES TECHNIQUE: Both transabdominal and transvaginal ultrasound examinations of the pelvis were performed. Transabdominal technique was performed for global imaging of the pelvis including uterus, ovaries, adnexal regions, and pelvic cul-de-sac. It was necessary to proceed with endovaginal exam following the transabdominal exam to visualize the ovaries and endometrium. Color and duplex Doppler ultrasound was utilized to evaluate blood flow to the ovaries. COMPARISON:  Pelvic ultrasound 06/28/2022. CT abdomen and pelvis 06/28/2022. pelvic ultrasound 02/10/2022. FINDINGS: Uterus Measurements: 11.3 x 7.0 x 8.0 cm = volume: 326 mL. No fibroids or other mass visualized. Endometrium Thickness: 2 cm.  No focal abnormality visualized. Right ovary Not visualized. Left ovary Measurements: 12.1 x 8.7 x 10.3 cm = volume: 564 mL. Within the left ovary there is a 7.4 x 9.0 x 8.6 cm hypoechoic/cystic area with increased through transmission and diffuse low-level echoes. Appearance is similar to the prior examination. Pulsed Doppler evaluation of the left ovary demonstrates normal low-resistance arterial and venous waveforms. Other findings No abnormal free fluid. IMPRESSION: 1. Unchanged 9 cm cyst containing diffuse low-level internal echoes within the left ovary. Findings may represent benign hemorrhagic cyst or endometrioma. Follow-up ultrasound recommended in 2-3 months to confirm resolution. Gynecology consultation recommended if not previously obtained. 2. There is normal vascular flow in the  left ovary. 3. Endometrium is mildly thickened without focal lesion. Clinical correlation and follow-up recommended. Electronically Signed   By: Ronney Asters M.D.   On: 07/24/2022 18:03   US Transvaginal Non-OB  Result Date: 07/24/2022 CLINICAL DATA:  Abdominal pain EXAM: TRANSABDOMINAL AND TRANSVAGINAL ULTRASOUND OF PELVIS DOPPLER ULTRASOUND OF OVARIES TECHNIQUE: Both transabdominal and transvaginal ultrasound examinations of the pelvis were performed. Transabdominal technique was performed for global imaging of the pelvis including uterus, ovaries, adnexal regions, and pelvic cul-de-sac. It was necessary to proceed with endovaginal exam following the transabdominal exam to visualize the ovaries and endometrium. Color and duplex Doppler ultrasound was utilized to evaluate blood flow to the ovaries. COMPARISON:  Pelvic ultrasound 06/28/2022. CT abdomen and pelvis 06/28/2022. pelvic ultrasound 02/10/2022. FINDINGS: Uterus Measurements: 11.3 x 7.0 x 8.0 cm = volume: 326 mL. No fibroids or other mass visualized. Endometrium Thickness: 2 cm.  No focal abnormality visualized. Right ovary Not visualized. Left ovary Measurements: 12.1 x 8.7 x 10.3 cm = volume: 564 mL. Within the left ovary there is a 7.4 x 9.0 x 8.6 cm hypoechoic/cystic area with increased through transmission and diffuse low-level echoes. Appearance  is similar to the prior examination. Pulsed Doppler evaluation of the left ovary demonstrates normal low-resistance arterial and venous waveforms. Other findings No abnormal free fluid. IMPRESSION: 1. Unchanged 9 cm cyst containing diffuse low-level internal echoes within the left ovary. Findings may represent benign hemorrhagic cyst or endometrioma. Follow-up ultrasound recommended in 2-3 months to confirm resolution. Gynecology consultation recommended if not previously obtained. 2. There is normal vascular flow in the left ovary. 3. Endometrium is mildly thickened without focal lesion. Clinical  correlation and follow-up recommended. Electronically Signed   By: Ronney Asters M.D.   On: 07/24/2022 18:03   [place] Art/Ven Flow Abd Pelv Doppler  Result Date: 07/24/2022 CLINICAL DATA:  Abdominal pain EXAM: TRANSABDOMINAL AND TRANSVAGINAL ULTRASOUND OF PELVIS DOPPLER ULTRASOUND OF OVARIES TECHNIQUE: Both transabdominal and transvaginal ultrasound examinations of the pelvis were performed. Transabdominal technique was performed for global imaging of the pelvis including uterus, ovaries, adnexal regions, and pelvic cul-de-sac. It was necessary to proceed with endovaginal exam following the transabdominal exam to visualize the ovaries and endometrium. Color and duplex Doppler ultrasound was utilized to evaluate blood flow to the ovaries. COMPARISON:  Pelvic ultrasound 06/28/2022. CT abdomen and pelvis 06/28/2022. pelvic ultrasound 02/10/2022. FINDINGS: Uterus Measurements: 11.3 x 7.0 x 8.0 cm = volume: 326 mL. No fibroids or other mass visualized. Endometrium Thickness: 2 cm.  No focal abnormality visualized. Right ovary Not visualized. Left ovary Measurements: 12.1 x 8.7 x 10.3 cm = volume: 564 mL. Within the left ovary there is a 7.4 x 9.0 x 8.6 cm hypoechoic/cystic area with increased through transmission and diffuse low-level echoes. Appearance is similar to the prior examination. Pulsed Doppler evaluation of the left ovary demonstrates normal low-resistance arterial and venous waveforms. Other findings No abnormal free fluid. IMPRESSION: 1. Unchanged 9 cm cyst containing diffuse low-level internal echoes within the left ovary. Findings may represent benign hemorrhagic cyst or endometrioma. Follow-up ultrasound recommended in 2-3 months to confirm resolution. Gynecology consultation recommended if not previously obtained. 2. There is normal vascular flow in the left ovary. 3. Endometrium is mildly thickened without focal lesion. Clinical correlation and follow-up recommended. Electronically Signed   By: Ronney Asters M.D.   On: 07/24/2022 18:03    Procedures Procedures    Medications Ordered in ED Medications  HYDROmorphone (DILAUDID) injection 1 mg (1 mg Intravenous Given 07/24/22 1635)  iohexol (OMNIPAQUE) 300 MG/ML solution 100 mL (100 mLs Intravenous Contrast Given 07/24/22 1814)  HYDROmorphone (DILAUDID) injection 1 mg (1 mg Intravenous Given 07/24/22 1838)    ED Course/ Medical Decision Making/ A&P                           Medical Decision Making 41 year old female with a past medical history of endometriosis and chronic abdominal pain presented to the emergency room with concerns of worsening back and abdominal pain.  Patient reports that this suddenly started 2 days ago.  She says she was laying in bed when this started.  She reports that nothing makes it better or worse.  She did tried naproxen with no relief.  She ran out of her chronic opioid medication.  Patient was recently seen and told she had a left ovarian cyst.  She is supposed to follow-up with gynecology in a couple months for reimaging.  On exam, patient does have some tenderness to her right lower quadrant and right upper quadrant.  She also has low back tenderness at her paraspinal muscles.  This could  be musculoskeletal versus worsening growth of her cyst.  Will obtain CT of abdomen.  We will also obtain ultrasound to rule out ovarian torsion. Lipase negative.  CMP unremarkable.  White count has jumped to 11.8.  Urine pending.  Amount and/or Complexity of Data Reviewed Radiology: ordered.  Risk Prescription drug management.   1822: Ultrasound revealing good flow to the ovary.  No concern for torsion at this time.  Awaiting CT abdomen pelvis with contrast.  2751: Patient still in pain.  Requesting more pain medication.  Will give another milligram of Dilaudid.  1907: CT showing similar size and appearance of left adnexal lesion.  There is enterologist fluid seen in the endometrial canal.  There is some mesenteric and  omental stranding.  This pain could be coming from the cyst that is continuously growing.  Patient will be reevaluated.  1935: Patient reevaluated at this time.  Patient is sleeping peacefully in bed.  She states her pain is better.  She is not tearful anymore.  She is requesting something to eat.  We will give her p.o. challenge in the department.  Given that her CT scan did not show anything acute, I feel as long as she can eat and drink without vomiting, I think she should be safe for discharge.  She should follow-up with her OB/GYN within the next week or 2.  We will send her home on some nausea and pain medication to hold her over until she can get in with her OB/GYN.  2021: Patient reevaluated.  Patient passed p.o. challenge.  Patient is agreeable to go home.  Patient stable for discharge and will be discharged.  Close follow-up with PCP and OB/GYN recommended.         Final Clinical Impression(s) / ED Diagnoses Final diagnoses:  Cyst of left ovary  Lower abdominal pain    Rx / DC Orders ED Discharge Orders          Ordered    ondansetron (ZOFRAN) 4 MG tablet  2 times daily        07/24/22 2010    oxyCODONE-acetaminophen (PERCOCET/ROXICET) 5-325 MG tablet  Every 4 hours PRN        07/24/22 2021              Leigh Aurora, DO 07/24/22 2022    Tegeler, Gwenyth Allegra, MD 07/26/22 0023

## 2022-07-24 NOTE — ED Triage Notes (Signed)
Pt arrives via EMS from home. Reports lower back pain and abdominal pain for the past couple of days. Denies any associated symptoms. Reports known kidney stone.

## 2022-07-24 NOTE — ED Provider Triage Note (Addendum)
Emergency Medicine Provider Triage Evaluation Note  Kristin Boyd , a 41 y.o. female  was evaluated in triage.  Pt complains of several days of lower back pain that now includes her abdomen.  No changes in urinary or bowel habits.  Known Hx of renal calculi.  States "pain is now going into my legs".  No recent trauma or injury.  Pain described as waxing and waning.  Had some relief with Tylenol yesterday.  Denies possibility of pregnancy.  Review of Systems  Positive:  Negative: See above  Physical Exam  BP (!) 139/101   Pulse 81   Temp 98.5 F (36.9 C) (Oral)   Resp 16   Ht '5\' 9"'$  (1.753 m)   Wt (!) 152 kg   SpO2 98%   BMI 49.47 kg/m  Gen:   Awake, appears uncomfortable, tearful, no distress   Resp:  Normal effort  MSK:   Moves extremities without difficulty  Other:  Abdomen protuberant and soft, diffusely tender.  Lumbar region diffusely tender.  Medical Decision Making  Medically screening exam initiated at 3:41 PM.  Appropriate orders placed.  Kristin Boyd was informed that the remainder of the evaluation will be completed by another provider, this initial triage assessment does not replace that evaluation, and the importance of remaining in the ED until their evaluation is complete.     Prince Rome, PA-C 38/10/17 1543   Due to pt's apparent discomfort, discussed pt with triage nurse.  Plan for pt to come back to next available bed   Prince Rome, PA-C 51/02/58 1554

## 2022-07-24 NOTE — ED Notes (Signed)
IV and labs/ HCG '@0419'$ 

## 2022-07-24 NOTE — Discharge Instructions (Addendum)
Thank you for allowing me to take part in your care today.  Here is what we discussed today.  1.  Given your left ovarian cyst, it has not grown too much.  You have received 2 mg Dilaudid in the emergency room.  Your left ovarian cyst is a little inflamed on CT scan.  Nothing acute today.  I will send you home on Percocet for pain, Zofran for nausea.  2.  Please follow-up with your OB/GYN.  Please call them and tell them that you were seen in the emergency department for your left ovarian cyst.  Tell him that it is inflamed, and needs to be seen.  3.  Please call your primary care physician to schedule appointment for follow-up.  Thank you, Dr. Posey Pronto

## 2022-07-25 ENCOUNTER — Telehealth: Payer: Self-pay

## 2022-07-25 NOTE — Telephone Encounter (Signed)
Pt called office this morning stating she was in the ER last night for pelvic pain and back pain. She states she needs a follow up with Dr.Tucker.   In reviewing the ER notes (see CT/US) it states she needs to follow up with her OB/Gyn.   I advised pt of this and she states she does not have one. She did not go to the appointment at [facility] for [facility], Dr Elgie Congo, in March due to she states "they did not have any openings"  She states her primary care Francene Castle NP, is no longer at the clinic. I asked her if she could call that office and get in with someone else. She states she does not have insurance. But she would call them to get an appointment.   Pt aware I will notify Dr. Berline Lopes of her recent ER visit and see if she has any advise on a further plan. Pt was thankful.

## 2022-07-28 ENCOUNTER — Encounter: Payer: Self-pay | Admitting: Gynecologic Oncology

## 2022-07-30 ENCOUNTER — Telehealth: Payer: Self-pay | Admitting: *Deleted

## 2022-07-30 NOTE — Telephone Encounter (Signed)
Per [facility] APP placed referral for the patient to see a gyn

## 2022-08-06 ENCOUNTER — Telehealth: Payer: Self-pay | Admitting: *Deleted

## 2022-08-06 NOTE — Telephone Encounter (Signed)
[facility] can not see the patient until mid November. New referral place to [facility]. Patient notified

## 2022-08-18 ENCOUNTER — Encounter: Payer: Self-pay | Admitting: Gynecologic Oncology

## 2022-08-21 ENCOUNTER — Other Ambulatory Visit: Payer: Self-pay

## 2022-08-21 ENCOUNTER — Encounter (HOSPITAL_BASED_OUTPATIENT_CLINIC_OR_DEPARTMENT_OTHER): Payer: Self-pay | Admitting: Emergency Medicine

## 2022-08-21 DIAGNOSIS — D72829 Elevated white blood cell count, unspecified: Secondary | ICD-10-CM | POA: Diagnosis not present

## 2022-08-21 DIAGNOSIS — Z91010 Allergy to peanuts: Secondary | ICD-10-CM | POA: Diagnosis not present

## 2022-08-21 DIAGNOSIS — R102 Pelvic and perineal pain: Secondary | ICD-10-CM | POA: Insufficient documentation

## 2022-08-21 DIAGNOSIS — R1084 Generalized abdominal pain: Secondary | ICD-10-CM | POA: Diagnosis present

## 2022-08-21 NOTE — ED Triage Notes (Signed)
Patient arrived via POV c/o pelvic and abdominal pain x 3 days. Patient states worse today. Patient states hx of endometriosis. Patient states 7/10 pain. Patient denies N/V. Patient is AO x 4, VS w/ elevated BP, normal gait.

## 2022-08-22 ENCOUNTER — Emergency Department (HOSPITAL_BASED_OUTPATIENT_CLINIC_OR_DEPARTMENT_OTHER)
Admission: EM | Admit: 2022-08-22 | Discharge: 2022-08-22 | Disposition: A | Discharge status: Home / Self Care | Payer: Commercial Managed Care - HMO | Attending: Emergency Medicine | Admitting: Emergency Medicine

## 2022-08-22 ENCOUNTER — Encounter: Payer: Self-pay | Admitting: Gynecologic Oncology

## 2022-08-22 DIAGNOSIS — R1084 Generalized abdominal pain: Secondary | ICD-10-CM

## 2022-08-22 LAB — COMPREHENSIVE METABOLIC PANEL
ALT: 11 U/L (ref 0–44)
AST: 13 U/L — ABNORMAL LOW (ref 15–41)
Albumin: 3.8 g/dL (ref 3.5–5.0)
Alkaline Phosphatase: 55 U/L (ref 38–126)
Anion gap: 7 (ref 5–15)
BUN: 16 mg/dL (ref 6–20)
CO2: 24 mmol/L (ref 22–32)
Calcium: 8.7 mg/dL — ABNORMAL LOW (ref 8.9–10.3)
Chloride: 107 mmol/L (ref 98–111)
Creatinine, Ser: 0.74 mg/dL (ref 0.44–1.00)
GFR, Estimated: >60 mL/min (ref >60)
Glucose, Bld: 95 mg/dL (ref 70–99)
Potassium: 3.8 mmol/L (ref 3.5–5.1)
Sodium: 138 mmol/L (ref 135–145)
Total Bilirubin: 1.2 mg/dL (ref 0.3–1.2)
Total Protein: 7.5 g/dL (ref 6.5–8.1)

## 2022-08-22 LAB — URINALYSIS, ROUTINE W REFLEX MICROSCOPIC
Bilirubin Urine: NEGATIVE
Glucose, UA: NEGATIVE mg/dL
Hgb urine dipstick: NEGATIVE
Ketones, ur: NEGATIVE mg/dL
Leukocytes,Ua: NEGATIVE
Nitrite: NEGATIVE
Protein, ur: NEGATIVE mg/dL
Specific Gravity, Urine: 1.02 (ref 1.005–1.030)
pH: 6 (ref 5.0–8.0)

## 2022-08-22 LAB — CBC WITH DIFFERENTIAL/PLATELET
Abs Immature Granulocytes: 0.03 10*3/uL (ref 0.00–0.07)
Basophils Absolute: 0.1 10*3/uL (ref 0.0–0.1)
Basophils Relative: 1 %
Eosinophils Absolute: 0.2 10*3/uL (ref 0.0–0.5)
Eosinophils Relative: 1 %
HCT: 39.2 % (ref 36.0–46.0)
Hemoglobin: 12.9 g/dL (ref 12.0–15.0)
Immature Granulocytes: 0 %
Lymphocytes Relative: 32 %
Lymphs Abs: 3.4 10*3/uL (ref 0.7–4.0)
MCH: 26.9 pg (ref 26.0–34.0)
MCHC: 32.9 g/dL (ref 30.0–36.0)
MCV: 81.8 fL (ref 80.0–100.0)
Monocytes Absolute: 0.8 10*3/uL (ref 0.1–1.0)
Monocytes Relative: 8 %
Neutro Abs: 6.1 10*3/uL (ref 1.7–7.7)
Neutrophils Relative %: 58 %
Platelets: 297 10*3/uL (ref 150–400)
RBC: 4.79 MIL/uL (ref 3.87–5.11)
RDW: 15 % (ref 11.5–15.5)
WBC: 10.6 10*3/uL — ABNORMAL HIGH (ref 4.0–10.5)
nRBC: 0 % (ref 0.0–0.2)

## 2022-08-22 LAB — LIPASE, BLOOD: Lipase: 25 U/L (ref 11–51)

## 2022-08-22 LAB — PREGNANCY, URINE: Preg Test, Ur: NEGATIVE

## 2022-08-22 MED ORDER — SODIUM CHLORIDE 0.9 % IV BOLUS
500.0000 mL | Freq: Once | INTRAVENOUS | Status: AC
  Administered 2022-08-22: 500 mL via INTRAVENOUS

## 2022-08-22 MED ORDER — MORPHINE SULFATE (PF) 4 MG/ML IV SOLN
4.0000 mg | Freq: Once | INTRAVENOUS | Status: AC
  Administered 2022-08-22: 4 mg via INTRAVENOUS
  Filled 2022-08-22: qty 1

## 2022-08-22 MED ORDER — KETOROLAC TROMETHAMINE 30 MG/ML IJ SOLN
15.0000 mg | Freq: Once | INTRAMUSCULAR | Status: AC
  Administered 2022-08-22: 15 mg via INTRAVENOUS
  Filled 2022-08-22: qty 1

## 2022-08-22 MED ORDER — DROPERIDOL 2.5 MG/ML IJ SOLN
2.5000 mg | Freq: Once | INTRAMUSCULAR | Status: AC
  Administered 2022-08-22: 2.5 mg via INTRAVENOUS
  Filled 2022-08-22: qty 2

## 2022-08-22 NOTE — ED Provider Notes (Signed)
Kristin Boyd   CSN: [id number] Arrival date & time: 08/21/22  2254     History  Chief Complaint  Patient presents with   Pelvic Pain    Kristin Boyd is a 41 y.o. female.  The history is provided by the patient and medical records.  Pelvic Pain  Kristin Boyd is a 41 y.o. female who presents to the Emergency Department complaining of abdominal pain.  She complains of generalized abdominal pain that radiates to bilateral hips and low back.  She describes the pain as a sensation of being hit by a bat.  She has a history of endometriosis and this feels similar but not as severe as her prior endometriosis flares.  No fever, vomiting, sob, diarrhea, constipation, dysuria, vaginal discharge.    Sees OBGYN 9/28  Hx/o cholecystectomy, endometrial ablation.      Home Medications Prior to Admission medications   Medication Sig Start Date End Date Taking? Authorizing Provider  amLODipine (NORVASC) 5 MG tablet Take 1 tablet (5 mg total) by mouth daily. Patient not taking: Reported on 06/28/2022 05/01/22   Lajean Saver, MD  ciprofloxacin (CIPRO) 500 MG tablet Take 1 tablet (500 mg total) by mouth every 12 (twelve) hours. 06/28/22   Rancour, Annie Main, MD  Elagolix Sodium (ORILISSA) 150 MG TABS Take 1 tablet by mouth daily. Patient not taking: Reported on 06/28/2022 02/03/22   Lafonda Mosses, MD  naproxen (NAPROSYN) 500 MG tablet Take 1 tablet (500 mg total) by mouth 2 (two) times daily with a meal. 06/28/22   Rancour, Annie Main, MD  norethindrone (AYGESTIN) 5 MG tablet Take 1 tablet (5 mg total) by mouth daily. Patient not taking: Reported on 06/28/2022 11/20/21   Lafonda Mosses, MD  ondansetron (ZOFRAN-ODT) 4 MG disintegrating tablet Take 1 tablet (4 mg total) by mouth every 8 (eight) hours as needed for nausea or vomiting. 06/28/22   Rancour, Annie Main, MD  oxyCODONE-acetaminophen (PERCOCET/ROXICET) 5-325 MG tablet Take 1-2 tablets by mouth  every 6 (six) hours as needed for severe pain. Patient not taking: Reported on 06/28/2022 12/27/21   Lacretia Leigh, MD  oxyCODONE-acetaminophen (PERCOCET/ROXICET) 5-325 MG tablet Take 1 tablet by mouth every 4 (four) hours as needed for severe pain. 07/24/22   Tegeler, Gwenyth Allegra, MD  senna-docusate (SENOKOT-S) 8.6-50 MG tablet Take 2 tablets by mouth at bedtime. For AFTER surgery, do not take if having diarrhea Patient not taking: Reported on 06/28/2022 10/23/21   Joylene John D, NP  traMADol (ULTRAM) 50 MG tablet Take 1 tablet (50 mg total) by mouth every 6 (six) hours as needed. Patient not taking: Reported on 06/28/2022 05/01/22   Lajean Saver, MD      Allergies    Peanut (diagnostic) and Penicillins    Review of Systems   Review of Systems  Genitourinary:  Positive for pelvic pain.  All other systems reviewed and are negative.   Physical Exam Updated Vital Signs BP (!) 148/76 (BP Location: Right Arm)   Pulse 72   Temp 98.4 F (36.9 C) (Oral)   Resp 19   Ht '5\' 9"'$  (1.753 m)   Wt (!) 150.1 kg   SpO2 97%   BMI 48.87 kg/m  Physical Exam Vitals and nursing Boyd reviewed.  Constitutional:      Appearance: She is well-developed.  HENT:     Head: Normocephalic and atraumatic.  Cardiovascular:     Rate and Rhythm: Normal rate and regular rhythm.     Heart sounds: No murmur  heard. Pulmonary:     Effort: Pulmonary effort is normal. No respiratory distress.     Breath sounds: Normal breath sounds.  Abdominal:     Palpations: Abdomen is soft.     Tenderness: There is no guarding or rebound.     Comments: Mild generalized abdominal tenderness  Musculoskeletal:        General: No tenderness.  Skin:    General: Skin is warm and dry.  Neurological:     Mental Status: She is alert and oriented to person, place, and time.  Psychiatric:        Behavior: Behavior normal.     ED Results / Procedures / Treatments   Labs (all labs ordered are listed, but only abnormal results  are displayed) Labs Reviewed  COMPREHENSIVE METABOLIC PANEL - Abnormal; Notable for the following components:      Result Value   Calcium 8.7 (*)    AST 13 (*)    All other components within normal limits  CBC WITH DIFFERENTIAL/PLATELET - Abnormal; Notable for the following components:   WBC 10.6 (*)    All other components within normal limits  URINALYSIS, ROUTINE W REFLEX MICROSCOPIC  PREGNANCY, URINE  LIPASE, BLOOD    EKG None  Radiology No results found.  Procedures Procedures    Medications Ordered in ED Medications  sodium chloride 0.9 % bolus 500 mL (0 mLs Intravenous Stopped 08/22/22 0125)  droperidol (INAPSINE) 2.5 MG/ML injection 2.5 mg (2.5 mg Intravenous Given 08/22/22 0047)  ketorolac (TORADOL) 30 MG/ML injection 15 mg (15 mg Intravenous Given 08/22/22 0047)  morphine (PF) 4 MG/ML injection 4 mg (4 mg Intravenous Given 08/22/22 0331)    ED Course/ Medical Decision Making/ A&P                           Medical Decision Making Amount and/or Complexity of Data Reviewed Labs: ordered.  Risk Prescription drug management.   Patient with history of endometriosis, ovarian cyst here for evaluation of recurrent abdominal pain, history of similar pain in the past.  She has mild generalized tenderness on examination without peritoneal findings.  She was treated with medications in the emergency department with improvement in her symptoms on reevaluation.  UA not consistent with UTI.  Patient is not pregnant.  CMP without significant electrolyte derangements.  CBC with very mild leukocytosis.  Patient has been seen in the emergency department for similar complaints with CT abdomen pelvis as well as pelvic ultrasound as recently as August 17 that was consistent with endometriosis.  Current clinical picture is not consistent with serious intra-abdominal infection, torsion.  Discussed with patient home care for abdominal pain with planned OB/GYN follow-up and return  precautions.       Final Clinical Impression(s) / ED Diagnoses Final diagnoses:  Generalized abdominal pain    Rx / DC Orders ED Discharge Orders     None         Quintella Reichert, MD 08/22/22 [phone]

## 2022-09-23 ENCOUNTER — Encounter (HOSPITAL_BASED_OUTPATIENT_CLINIC_OR_DEPARTMENT_OTHER): Payer: Self-pay

## 2022-09-23 ENCOUNTER — Other Ambulatory Visit: Payer: Self-pay

## 2022-09-23 DIAGNOSIS — Z91010 Allergy to peanuts: Secondary | ICD-10-CM | POA: Diagnosis not present

## 2022-09-23 DIAGNOSIS — M545 Low back pain, unspecified: Secondary | ICD-10-CM | POA: Diagnosis present

## 2022-09-23 MED ORDER — OXYCODONE-ACETAMINOPHEN 5-325 MG PO TABS
1.0000 | ORAL_TABLET | ORAL | Status: DC | PRN
  Administered 2022-09-23: 1 via ORAL
  Filled 2022-09-23: qty 1

## 2022-09-23 NOTE — ED Triage Notes (Signed)
Pt reports intermittent lower back pain that began at 5am. Denies urinary sx. Denies injury. Reports hx of endometriosis, LT kidney stone, and LT ovarian cyst.

## 2022-09-24 ENCOUNTER — Emergency Department (HOSPITAL_BASED_OUTPATIENT_CLINIC_OR_DEPARTMENT_OTHER)
Admission: EM | Admit: 2022-09-24 | Discharge: 2022-09-24 | Disposition: A | Discharge status: Home / Self Care | Payer: Commercial Managed Care - HMO | Attending: Emergency Medicine | Admitting: Emergency Medicine

## 2022-09-24 DIAGNOSIS — M545 Low back pain, unspecified: Secondary | ICD-10-CM

## 2022-09-24 MED ORDER — NAPROXEN 500 MG PO TABS: 500.0000 mg | ORAL_TABLET | Freq: Two times a day (BID) | ORAL | 0 refills | Status: DC

## 2022-09-24 MED ORDER — OXYCODONE-ACETAMINOPHEN 5-325 MG PO TABS
2.0000 | ORAL_TABLET | Freq: Once | ORAL | Status: AC
  Administered 2022-09-24: 1 via ORAL
  Filled 2022-09-24: qty 2

## 2022-09-24 MED ORDER — KETOROLAC TROMETHAMINE 60 MG/2ML IM SOLN
60.0000 mg | Freq: Once | INTRAMUSCULAR | Status: AC
  Administered 2022-09-24: 60 mg via INTRAMUSCULAR
  Filled 2022-09-24: qty 2

## 2022-09-24 MED ORDER — HYDROCODONE-ACETAMINOPHEN 5-325 MG PO TABS: 1.0000 | ORAL_TABLET | Freq: Four times a day (QID) | ORAL | 0 refills | Status: DC | PRN

## 2022-09-24 NOTE — ED Provider Notes (Signed)
[facility] Provider Note   CSN: [id number] Arrival date & time: 09/23/22  2050     History  Chief Complaint  Patient presents with   Back Pain    Kristin Boyd is a 41 y.o. female.  Patient is a 41 year old female with past medical history of endometriosis.  Patient presenting today with complaints of low back pain.  This started this morning at approximately 5 AM shortly after waking from sleep.  This began in the absence of any injury or trauma.  She describes a constant ache to her low back that is worse when she sits up, stands, or attempts to move.  She denies any radiation into her legs, weakness, or bowel or bladder incontinence.    The history is provided by the patient.  Back Pain Location:  Lumbar spine Quality:  Aching Radiates to:  Does not radiate Pain severity:  Moderate Onset quality:  Sudden Duration:  1 day      Home Medications Prior to Admission medications   Medication Sig Start Date End Date Taking? Authorizing Provider  amLODipine (NORVASC) 5 MG tablet Take 1 tablet (5 mg total) by mouth daily. Patient not taking: Reported on 06/28/2022 05/01/22   Lajean Saver, MD  ciprofloxacin (CIPRO) 500 MG tablet Take 1 tablet (500 mg total) by mouth every 12 (twelve) hours. 06/28/22   Rancour, Annie Main, MD  Elagolix Sodium (ORILISSA) 150 MG TABS Take 1 tablet by mouth daily. Patient not taking: Reported on 06/28/2022 02/03/22   Lafonda Mosses, MD  naproxen (NAPROSYN) 500 MG tablet Take 1 tablet (500 mg total) by mouth 2 (two) times daily with a meal. 06/28/22   Rancour, Annie Main, MD  norethindrone (AYGESTIN) 5 MG tablet Take 1 tablet (5 mg total) by mouth daily. Patient not taking: Reported on 06/28/2022 11/20/21   Lafonda Mosses, MD  ondansetron (ZOFRAN-ODT) 4 MG disintegrating tablet Take 1 tablet (4 mg total) by mouth every 8 (eight) hours as needed for nausea or vomiting. 06/28/22   Rancour, Annie Main, MD   oxyCODONE-acetaminophen (PERCOCET/ROXICET) 5-325 MG tablet Take 1-2 tablets by mouth every 6 (six) hours as needed for severe pain. Patient not taking: Reported on 06/28/2022 12/27/21   Lacretia Leigh, MD  oxyCODONE-acetaminophen (PERCOCET/ROXICET) 5-325 MG tablet Take 1 tablet by mouth every 4 (four) hours as needed for severe pain. 07/24/22   Tegeler, Gwenyth Allegra, MD  senna-docusate (SENOKOT-S) 8.6-50 MG tablet Take 2 tablets by mouth at bedtime. For AFTER surgery, do not take if having diarrhea Patient not taking: Reported on 06/28/2022 10/23/21   Joylene John D, NP  traMADol (ULTRAM) 50 MG tablet Take 1 tablet (50 mg total) by mouth every 6 (six) hours as needed. Patient not taking: Reported on 06/28/2022 05/01/22   Lajean Saver, MD      Allergies    Peanut (diagnostic) and Penicillins    Review of Systems   Review of Systems  Musculoskeletal:  Positive for back pain.  All other systems reviewed and are negative.   Physical Exam Updated Vital Signs BP (!) 190/108 (BP Location: Left Arm)   Pulse 88   Temp 98.9 F (37.2 C) (Oral)   Resp 20   Ht '5\' 9"'$  (1.753 m)   Wt (!) 148.3 kg   SpO2 92%   BMI 48.29 kg/m  Physical Exam Vitals and nursing note reviewed.  Constitutional:      General: She is not in acute distress.    Appearance: She is well-developed. She is not  diaphoretic.  HENT:     Head: Normocephalic and atraumatic.  Cardiovascular:     Rate and Rhythm: Normal rate and regular rhythm.     Heart sounds: No murmur heard.    No friction rub. No gallop.  Pulmonary:     Effort: Pulmonary effort is normal. No respiratory distress.     Breath sounds: Normal breath sounds. No wheezing.  Abdominal:     General: Bowel sounds are normal. There is no distension.     Palpations: Abdomen is soft.     Tenderness: There is no abdominal tenderness.  Musculoskeletal:        General: Normal range of motion.     Cervical back: Normal range of motion and neck supple.      Comments: There is tenderness to palpation in the soft tissues of the lumbar region.  Skin:    General: Skin is warm and dry.  Neurological:     General: No focal deficit present.     Mental Status: She is alert and oriented to person, place, and time.     Comments: DTRs are 1+ and symmetrical in the patellar and Achilles tendons bilaterally.  Strength is 5 out of 5 in both lower extremities.  She is able to ambulate.     ED Results / Procedures / Treatments   Labs (all labs ordered are listed, but only abnormal results are displayed) Labs Reviewed - No data to display  EKG None  Radiology No results found.  Procedures Procedures    Medications Ordered in ED Medications  oxyCODONE-acetaminophen (PERCOCET/ROXICET) 5-325 MG per tablet 1 tablet (1 tablet Oral Given 09/23/22 2259)  ketorolac (TORADOL) injection 60 mg (has no administration in time range)  oxyCODONE-acetaminophen (PERCOCET/ROXICET) 5-325 MG per tablet 2 tablet (has no administration in time range)    ED Course/ Medical Decision Making/ A&P  Patient is a 41 year old female presenting with complaints of low back pain that seems musculoskeletal in nature.  She has symmetrical reflexes and strength on exam and no bowel or bladder complaints that would suggest an emergent situation.  Patient given Toradol and pain medication in the ER and seems to be feeling better.  At this point, feel as though she can safely be discharged with outpatient follow-up and as needed return.  Final Clinical Impression(s) / ED Diagnoses Final diagnoses:  None    Rx / DC Orders ED Discharge Orders     None         Veryl Speak, MD 09/24/22 [phone]

## 2022-09-24 NOTE — Discharge Instructions (Signed)
Begin taking naproxen as prescribed.  Begin taking hydrocodone as prescribed as needed for pain not relieved with naproxen.  Follow-up with your primary doctor if symptoms are not improving in the next week.

## 2022-09-24 NOTE — ED Notes (Signed)
Back pain since yesterday @ 5am No urinary symptoms

## 2022-09-25 ENCOUNTER — Encounter (HOSPITAL_BASED_OUTPATIENT_CLINIC_OR_DEPARTMENT_OTHER): Payer: Self-pay

## 2022-09-25 ENCOUNTER — Emergency Department (HOSPITAL_BASED_OUTPATIENT_CLINIC_OR_DEPARTMENT_OTHER)
Admission: EM | Admit: 2022-09-25 | Discharge: 2022-09-26 | Disposition: A | Discharge status: Home / Self Care | Payer: Commercial Managed Care - HMO | Attending: Emergency Medicine | Admitting: Emergency Medicine

## 2022-09-25 ENCOUNTER — Other Ambulatory Visit: Payer: Self-pay

## 2022-09-25 DIAGNOSIS — D72829 Elevated white blood cell count, unspecified: Secondary | ICD-10-CM | POA: Diagnosis not present

## 2022-09-25 DIAGNOSIS — R1084 Generalized abdominal pain: Secondary | ICD-10-CM | POA: Diagnosis present

## 2022-09-25 DIAGNOSIS — E876 Hypokalemia: Secondary | ICD-10-CM | POA: Diagnosis not present

## 2022-09-25 DIAGNOSIS — N809 Endometriosis, unspecified: Secondary | ICD-10-CM | POA: Insufficient documentation

## 2022-09-25 DIAGNOSIS — Z91010 Allergy to peanuts: Secondary | ICD-10-CM | POA: Insufficient documentation

## 2022-09-25 LAB — CBC
HCT: 39.8 % (ref 36.0–46.0)
Hemoglobin: 13.1 g/dL (ref 12.0–15.0)
MCH: 26.7 pg (ref 26.0–34.0)
MCHC: 32.9 g/dL (ref 30.0–36.0)
MCV: 81.1 fL (ref 80.0–100.0)
Platelets: 313 10*3/uL (ref 150–400)
RBC: 4.91 MIL/uL (ref 3.87–5.11)
RDW: 14.8 % (ref 11.5–15.5)
WBC: 13 10*3/uL — ABNORMAL HIGH (ref 4.0–10.5)
nRBC: 0 % (ref 0.0–0.2)

## 2022-09-25 LAB — COMPREHENSIVE METABOLIC PANEL
ALT: 20 U/L (ref 0–44)
AST: 21 U/L (ref 15–41)
Albumin: 4 g/dL (ref 3.5–5.0)
Alkaline Phosphatase: 59 U/L (ref 38–126)
Anion gap: 8 (ref 5–15)
BUN: 12 mg/dL (ref 6–20)
CO2: 20 mmol/L — ABNORMAL LOW (ref 22–32)
Calcium: 8.5 mg/dL — ABNORMAL LOW (ref 8.9–10.3)
Chloride: 109 mmol/L (ref 98–111)
Creatinine, Ser: 0.73 mg/dL (ref 0.44–1.00)
GFR, Estimated: >60 mL/min (ref >60)
Glucose, Bld: 110 mg/dL — ABNORMAL HIGH (ref 70–99)
Potassium: 3.4 mmol/L — ABNORMAL LOW (ref 3.5–5.1)
Sodium: 137 mmol/L (ref 135–145)
Total Bilirubin: 1.4 mg/dL — ABNORMAL HIGH (ref 0.3–1.2)
Total Protein: 8.2 g/dL — ABNORMAL HIGH (ref 6.5–8.1)

## 2022-09-25 LAB — LIPASE, BLOOD: Lipase: 22 U/L (ref 11–51)

## 2022-09-25 MED ORDER — HALOPERIDOL LACTATE 5 MG/ML IJ SOLN
5.0000 mg | Freq: Once | INTRAMUSCULAR | Status: AC
  Administered 2022-09-25: 5 mg via INTRAVENOUS
  Filled 2022-09-25: qty 1

## 2022-09-25 MED ORDER — KETOROLAC TROMETHAMINE 30 MG/ML IJ SOLN
30.0000 mg | Freq: Once | INTRAMUSCULAR | Status: AC
  Administered 2022-09-25: 30 mg via INTRAVENOUS
  Filled 2022-09-25: qty 1

## 2022-09-25 NOTE — ED Triage Notes (Signed)
Pt recently seen for lower back pain. Pt reports diffuse abdominal pain and bilateral hip pain. Pt took 2 hydrocodone at around 5:30 pm. Pt denies urinary sx; pt denies N/V/D. Hx of endometriosis, ovarian cyst, and has known kidney stone.

## 2022-09-26 ENCOUNTER — Emergency Department (HOSPITAL_BASED_OUTPATIENT_CLINIC_OR_DEPARTMENT_OTHER): Payer: Commercial Managed Care - HMO

## 2022-09-26 LAB — URINALYSIS, ROUTINE W REFLEX MICROSCOPIC
Glucose, UA: NEGATIVE mg/dL
Hgb urine dipstick: NEGATIVE
Ketones, ur: NEGATIVE mg/dL
Leukocytes,Ua: NEGATIVE
Nitrite: NEGATIVE
Protein, ur: NEGATIVE mg/dL
Specific Gravity, Urine: 1.025 (ref 1.005–1.030)
pH: 7 (ref 5.0–8.0)

## 2022-09-26 LAB — PREGNANCY, URINE: Preg Test, Ur: NEGATIVE

## 2022-09-26 MED ORDER — DICLOFENAC SODIUM ER 100 MG PO TB24: 100.0000 mg | ORAL_TABLET | Freq: Every day | ORAL | 0 refills | Status: DC

## 2022-09-26 NOTE — ED Provider Notes (Signed)
[facility]PARTMENT Provider Note   CSN: [id number] Arrival date & time: 09/25/22  2152     History  Chief Complaint  Patient presents with   Abdominal Pain    Kristin Boyd is a 41 y.o. female.  The history is provided by the patient.  Abdominal Pain Pain location:  Generalized Pain radiates to:  Does not radiate Pain severity:  Severe Onset quality:  Gradual Duration: days. Timing:  Constant Progression:  Unchanged Chronicity:  Recurrent Relieved by:  Nothing Worsened by:  Nothing Ineffective treatments:  None tried Associated symptoms: no diarrhea, no fever and no vomiting   Risk factors: not pregnant   Patient with endometriosis who was seen for back pain and abdominal pain on 10/18 presents with ongoing symptoms.       Home Medications Prior to Admission medications   Medication Sig Start Date End Date Taking? Authorizing Provider  Diclofenac Sodium CR 100 MG 24 hr tablet Take 1 tablet (100 mg total) by mouth daily. 09/26/22  Yes Tyner Codner, MD  amLODipine (NORVASC) 5 MG tablet Take 1 tablet (5 mg total) by mouth daily. Patient not taking: Reported on 06/28/2022 05/01/22   Lajean Saver, MD  ciprofloxacin (CIPRO) 500 MG tablet Take 1 tablet (500 mg total) by mouth every 12 (twelve) hours. 06/28/22   Rancour, Annie Main, MD  Elagolix Sodium (ORILISSA) 150 MG TABS Take 1 tablet by mouth daily. Patient not taking: Reported on 06/28/2022 02/03/22   Lafonda Mosses, MD  HYDROcodone-acetaminophen [facility]) 5-325 MG tablet Take 1-2 tablets by mouth every 6 (six) hours as needed. 09/24/22   Veryl Speak, MD  naproxen (NAPROSYN) 500 MG tablet Take 1 tablet (500 mg total) by mouth 2 (two) times daily. 09/24/22   Veryl Speak, MD  norethindrone (AYGESTIN) 5 MG tablet Take 1 tablet (5 mg total) by mouth daily. Patient not taking: Reported on 06/28/2022 11/20/21   Lafonda Mosses, MD  ondansetron (ZOFRAN-ODT) 4 MG disintegrating tablet Take 1  tablet (4 mg total) by mouth every 8 (eight) hours as needed for nausea or vomiting. 06/28/22   Rancour, Annie Main, MD  oxyCODONE-acetaminophen (PERCOCET/ROXICET) 5-325 MG tablet Take 1-2 tablets by mouth every 6 (six) hours as needed for severe pain. Patient not taking: Reported on 06/28/2022 12/27/21   Lacretia Leigh, MD  oxyCODONE-acetaminophen (PERCOCET/ROXICET) 5-325 MG tablet Take 1 tablet by mouth every 4 (four) hours as needed for severe pain. 07/24/22   Tegeler, Gwenyth Allegra, MD  senna-docusate (SENOKOT-S) 8.6-50 MG tablet Take 2 tablets by mouth at bedtime. For AFTER surgery, do not take if having diarrhea Patient not taking: Reported on 06/28/2022 10/23/21   Joylene John D, NP  traMADol (ULTRAM) 50 MG tablet Take 1 tablet (50 mg total) by mouth every 6 (six) hours as needed. Patient not taking: Reported on 06/28/2022 05/01/22   Lajean Saver, MD      Allergies    Peanut (diagnostic) and Penicillins    Review of Systems   Review of Systems  Constitutional:  Negative for fever.  HENT:  Negative for facial swelling.   Eyes:  Negative for redness.  Respiratory:  Negative for wheezing and stridor.   Gastrointestinal:  Positive for abdominal pain. Negative for diarrhea and vomiting.  All other systems reviewed and are negative.   Physical Exam Updated Vital Signs BP 139/77   Pulse 76   Temp 98.2 F (36.8 C) (Oral)   Resp 11   Ht '5\' 9"'$  (1.753 m)   Wt (!) 148.3  kg   SpO2 96%   BMI 48.28 kg/m  Physical Exam Vitals and nursing note reviewed.  Constitutional:      General: She is not in acute distress.    Appearance: Normal appearance. She is well-developed.  HENT:     Head: Normocephalic and atraumatic.     Nose: Nose normal.  Eyes:     Pupils: Pupils are equal, round, and reactive to light.  Cardiovascular:     Rate and Rhythm: Normal rate and regular rhythm.     Pulses: Normal pulses.     Heart sounds: Normal heart sounds.  Pulmonary:     Effort: Pulmonary effort is  normal. No respiratory distress.     Breath sounds: Normal breath sounds.  Abdominal:     General: Bowel sounds are normal. There is no distension.     Palpations: Abdomen is soft.     Tenderness: There is no guarding or rebound.  Genitourinary:    Vagina: No vaginal discharge.  Musculoskeletal:        General: Normal range of motion.     Cervical back: Neck supple.  Skin:    General: Skin is warm and dry.     Capillary Refill: Capillary refill takes less than 2 seconds.     Findings: No erythema or rash.  Neurological:     General: No focal deficit present.     Mental Status: She is oriented to person, place, and time.     Deep Tendon Reflexes: Reflexes normal.  Psychiatric:        Mood and Affect: Mood normal.     ED Results / Procedures / Treatments   Labs (all labs ordered are listed, but only abnormal results are displayed) Results for orders placed or performed during the hospital encounter of 09/25/22  Lipase, blood  Result Value Ref Range   Lipase 22 11 - 51 U/L  Comprehensive metabolic panel  Result Value Ref Range   Sodium 137 135 - 145 mmol/L   Potassium 3.4 (L) 3.5 - 5.1 mmol/L   Chloride 109 98 - 111 mmol/L   CO2 20 (L) 22 - 32 mmol/L   Glucose, Bld 110 (H) 70 - 99 mg/dL   BUN 12 6 - 20 mg/dL   Creatinine, Ser 0.73 0.44 - 1.00 mg/dL   Calcium 8.5 (L) 8.9 - 10.3 mg/dL   Total Protein 8.2 (H) 6.5 - 8.1 g/dL   Albumin 4.0 3.5 - 5.0 g/dL   AST 21 15 - 41 U/L   ALT 20 0 - 44 U/L   Alkaline Phosphatase 59 38 - 126 U/L   Total Bilirubin 1.4 (H) 0.3 - 1.2 mg/dL   GFR, Estimated >60 >60 mL/min   Anion gap 8 5 - 15  CBC  Result Value Ref Range   WBC 13.0 (H) 4.0 - 10.5 K/uL   RBC 4.91 3.87 - 5.11 MIL/uL   Hemoglobin 13.1 12.0 - 15.0 g/dL   HCT 39.8 36.0 - 46.0 %   MCV 81.1 80.0 - 100.0 fL   MCH 26.7 26.0 - 34.0 pg   MCHC 32.9 30.0 - 36.0 g/dL   RDW 14.8 11.5 - 15.5 %   Platelets 313 150 - 400 K/uL   nRBC 0.0 0.0 - 0.2 %  Urinalysis, Routine w reflex  microscopic Urine, Clean Catch  Result Value Ref Range   Color, Urine YELLOW YELLOW   APPearance CLOUDY (A) CLEAR   Specific Gravity, Urine 1.025 1.005 - 1.030   pH 7.0  5.0 - 8.0   Glucose, UA NEGATIVE NEGATIVE mg/dL   Hgb urine dipstick NEGATIVE NEGATIVE   Bilirubin Urine SMALL (A) NEGATIVE   Ketones, ur NEGATIVE NEGATIVE mg/dL   Protein, ur NEGATIVE NEGATIVE mg/dL   Nitrite NEGATIVE NEGATIVE   Leukocytes,Ua NEGATIVE NEGATIVE  Pregnancy, urine  Result Value Ref Range   Preg Test, Ur NEGATIVE NEGATIVE   CT Renal Stone Study  Result Date: 09/26/2022 CLINICAL DATA:  Diffuse abdominal pain; known kidney stone EXAM: CT ABDOMEN AND PELVIS WITHOUT CONTRAST TECHNIQUE: Multidetector CT imaging of the abdomen and pelvis was performed following the standard protocol without IV contrast. RADIATION DOSE REDUCTION: This exam was performed according to the departmental dose-optimization program which includes automated exposure control, adjustment of the mA and/or kV according to patient size and/or use of iterative reconstruction technique. COMPARISON:  CT 07/24/2022 FINDINGS: Lower chest: No acute abnormality. Hepatobiliary: No focal liver abnormality is seen. Status post cholecystectomy. No biliary dilatation. Pancreas: Unremarkable. No pancreatic ductal dilatation or surrounding inflammatory changes. Spleen: Normal in size without focal abnormality. Adrenals/Urinary Tract: Unremarkable adrenal glands. Nonobstructing 7 mm stone in the mid left kidney. No additional urinary calculi. No hydronephrosis. Nondistended bladder. Stomach/Bowel: Stomach is within normal limits. Appendix appears normal. No evidence of bowel wall thickening, distention, or inflammatory changes. Vascular/Lymphatic: No significant vascular findings are present. No enlarged abdominal or pelvic lymph nodes. Reproductive: Multiloculated lesion in the left adnexa measures 10.3 x 8.4 cm, unchanged from 07/24/2022 given differences in  measuring technique. 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 Distended endometrial cavity is also similar consistent with hematometra. Other: Similar mesenteric and omental stranding greatest within the bilateral pericolic gutters. Small amount of free fluid in the pelvis. No free intraperitoneal air. Musculoskeletal: No acute osseous abnormality. IMPRESSION: Mesenteric and omental stranding greatest in the pericolic gutters is similar. This is nonspecific but may reflect sequela of endometriosis given the findings in the left adnexa and uterus. Similar size and appearance of the left adnexal region previously characterized by pelvic MRI as a endometrium and hematosalpinx. Similar hematometra. Left nephrolithiasis.  No obstructing stone or hydronephrosis. Electronically Signed   By: Placido Sou M.D.   On: 09/26/2022 01:44    Radiology CT Renal Stone Study  Result Date: 09/26/2022 CLINICAL DATA:  Diffuse abdominal pain; known kidney stone EXAM: CT ABDOMEN AND PELVIS WITHOUT CONTRAST TECHNIQUE: Multidetector CT imaging of the abdomen and pelvis was performed following the standard protocol without IV contrast. RADIATION DOSE REDUCTION: This exam was performed according to the departmental dose-optimization program which includes automated exposure control, adjustment of the mA and/or kV according to patient size and/or use of iterative reconstruction technique. COMPARISON:  CT 07/24/2022 FINDINGS: Lower chest: No acute abnormality. Hepatobiliary: No focal liver abnormality is seen. Status post cholecystectomy. No biliary dilatation. Pancreas: Unremarkable. No pancreatic ductal dilatation or surrounding inflammatory changes. Spleen: Normal in size without focal abnormality. Adrenals/Urinary Tract: Unremarkable adrenal glands. Nonobstructing 7 mm stone in the mid left kidney. No additional urinary calculi. No hydronephrosis.  Nondistended bladder. Stomach/Bowel: Stomach is within normal limits. Appendix appears normal. No evidence of bowel wall thickening, distention, or inflammatory changes. Vascular/Lymphatic: No significant vascular findings are present. No enlarged abdominal or pelvic lymph nodes. Reproductive: Multiloculated lesion in the left adnexa measures 10.3 x 8.4 cm, unchanged from 07/24/2022 given differences in measuring technique. 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  Distended endometrial cavity is also similar consistent with hematometra. Other: Similar mesenteric and omental stranding greatest within the bilateral pericolic gutters. Small amount of free fluid in the pelvis. No free intraperitoneal air. Musculoskeletal: No acute osseous abnormality. IMPRESSION: Mesenteric and omental stranding greatest in the pericolic gutters is similar. This is nonspecific but may reflect sequela of endometriosis given the findings in the left adnexa and uterus. Similar size and appearance of the left adnexal region previously characterized by pelvic MRI as a endometrium and hematosalpinx. Similar hematometra. Left nephrolithiasis.  No obstructing stone or hydronephrosis. Electronically Signed   By: Placido Sou M.D.   On: 09/26/2022 01:44    Procedures Procedures    Medications Ordered in ED Medications  ketorolac (TORADOL) 30 MG/ML injection 30 mg (30 mg Intravenous Given 09/25/22 2312)  haloperidol lactate (HALDOL) injection 5 mg (5 mg Intravenous Given 09/25/22 2312)    ED Course/ Medical Decision Making/ A&P                           Medical Decision Making Patient with endometriosis with ongoing abdominal pain   Amount and/or Complexity of Data Reviewed Independent Historian:     Details: Previous ED notes  External Data Reviewed: notes.    Details: Previous ED notes Labs: ordered.    Details: All labs reviewed: urine is  not consistent with UTI. Pregnancy is negative.  Lipase is normal 22.  Normal sodium 137, potassium slightly low 3.4.  Normal creatinine normal LFTs.  White count 13 elevated, normal hemoglobin 13.1 normal platelets  Radiology: ordered and independent interpretation performed.    Details: No free air or bowel pathology by me on CT  Risk Prescription drug management. Risk Details: Well appearing.  Feeling improved.  Po challenged.  Will need close follow up with GYN.  Strict return     Final Clinical Impression(s) / ED Diagnoses Final diagnoses:  Endometriosis   Return for intractable cough, coughing up blood, fevers > 100.4 unrelieved by medication, shortness of breath, intractable vomiting, chest pain, shortness of breath, weakness, numbness, changes in speech, facial asymmetry, abdominal pain, passing out, Inability to tolerate liquids or food, cough, altered mental status or any concerns. No signs of systemic illness or infection. The patient is nontoxic-appearing on exam and vital signs are within normal limits.  I have reviewed the triage vital signs and the nursing notes. Pertinent labs & imaging results that were available during my care of the patient were reviewed by me and considered in my medical decision making (see chart for details). After history, exam, and medical workup I feel the patient has been appropriately medically screened and is safe for discharge home. Pertinent diagnoses were discussed with the patient. Patient was given return precautions.    Rx / DC Orders ED Discharge Orders          Ordered    Diclofenac Sodium CR 100 MG 24 hr tablet  Daily        09/26/22 0257              Cortez Flippen, MD 09/26/22 6270

## 2022-09-26 NOTE — ED Notes (Signed)
Pt PO challenge complete. Pt states she feels better.

## 2022-10-27 ENCOUNTER — Encounter (HOSPITAL_BASED_OUTPATIENT_CLINIC_OR_DEPARTMENT_OTHER): Payer: Self-pay | Admitting: Emergency Medicine

## 2022-10-27 ENCOUNTER — Emergency Department (HOSPITAL_BASED_OUTPATIENT_CLINIC_OR_DEPARTMENT_OTHER)
Admission: EM | Admit: 2022-10-27 | Discharge: 2022-10-28 | Disposition: A | Discharge status: Home / Self Care | Payer: Commercial Managed Care - HMO | Attending: Emergency Medicine | Admitting: Emergency Medicine

## 2022-10-27 DIAGNOSIS — I10 Essential (primary) hypertension: Secondary | ICD-10-CM | POA: Diagnosis not present

## 2022-10-27 DIAGNOSIS — R102 Pelvic and perineal pain: Secondary | ICD-10-CM | POA: Diagnosis present

## 2022-10-27 DIAGNOSIS — G8929 Other chronic pain: Secondary | ICD-10-CM | POA: Insufficient documentation

## 2022-10-27 DIAGNOSIS — Z8616 Personal history of COVID-19: Secondary | ICD-10-CM | POA: Insufficient documentation

## 2022-10-27 DIAGNOSIS — Z87891 Personal history of nicotine dependence: Secondary | ICD-10-CM | POA: Insufficient documentation

## 2022-10-27 DIAGNOSIS — N9489 Other specified conditions associated with female genital organs and menstrual cycle: Secondary | ICD-10-CM | POA: Diagnosis not present

## 2022-10-27 DIAGNOSIS — Z79899 Other long term (current) drug therapy: Secondary | ICD-10-CM | POA: Diagnosis not present

## 2022-10-27 HISTORY — DX: Calculus of kidney: N20.0

## 2022-10-27 LAB — CBC WITH DIFFERENTIAL/PLATELET
Abs Immature Granulocytes: 0.02 10*3/uL (ref 0.00–0.07)
Basophils Absolute: 0.1 10*3/uL (ref 0.0–0.1)
Basophils Relative: 1 %
Eosinophils Absolute: 0.1 10*3/uL (ref 0.0–0.5)
Eosinophils Relative: 1 %
HCT: 41.6 % (ref 36.0–46.0)
Hemoglobin: 13.6 g/dL (ref 12.0–15.0)
Immature Granulocytes: 0 %
Lymphocytes Relative: 19 %
Lymphs Abs: 2.5 10*3/uL (ref 0.7–4.0)
MCH: 26.9 pg (ref 26.0–34.0)
MCHC: 32.7 g/dL (ref 30.0–36.0)
MCV: 82.4 fL (ref 80.0–100.0)
Monocytes Absolute: 0.7 10*3/uL (ref 0.1–1.0)
Monocytes Relative: 5 %
Neutro Abs: 9.6 10*3/uL — ABNORMAL HIGH (ref 1.7–7.7)
Neutrophils Relative %: 74 %
Platelets: 284 10*3/uL (ref 150–400)
RBC: 5.05 MIL/uL (ref 3.87–5.11)
RDW: 15.5 % (ref 11.5–15.5)
WBC: 12.9 10*3/uL — ABNORMAL HIGH (ref 4.0–10.5)
nRBC: 0 % (ref 0.0–0.2)

## 2022-10-27 LAB — BASIC METABOLIC PANEL
Anion gap: 10 (ref 5–15)
BUN: 8 mg/dL (ref 6–20)
CO2: 19 mmol/L — ABNORMAL LOW (ref 22–32)
Calcium: 8.5 mg/dL — ABNORMAL LOW (ref 8.9–10.3)
Chloride: 106 mmol/L (ref 98–111)
Creatinine, Ser: 0.68 mg/dL (ref 0.44–1.00)
GFR, Estimated: >60 mL/min (ref >60)
Glucose, Bld: 104 mg/dL — ABNORMAL HIGH (ref 70–99)
Potassium: 4.4 mmol/L (ref 3.5–5.1)
Sodium: 135 mmol/L (ref 135–145)

## 2022-10-27 LAB — HCG, SERUM, QUALITATIVE: Preg, Serum: NEGATIVE

## 2022-10-27 MED ORDER — ONDANSETRON HCL 4 MG/2ML IJ SOLN
4.0000 mg | Freq: Once | INTRAMUSCULAR | Status: AC
  Administered 2022-10-27: 4 mg via INTRAVENOUS
  Filled 2022-10-27: qty 2

## 2022-10-27 MED ORDER — HYDROMORPHONE HCL 1 MG/ML IJ SOLN
1.0000 mg | Freq: Once | INTRAMUSCULAR | Status: AC
  Administered 2022-10-27: 1 mg via INTRAVENOUS
  Filled 2022-10-27: qty 1

## 2022-10-27 NOTE — ED Provider Notes (Signed)
[facility] Provider Note: Georgena Spurling, MD, FACEP  CSN: [id number] MRN: [id number] ARRIVAL: 10/27/22 at 2253 ROOM: [place]  Abdominal Pain   HISTORY OF PRESENT ILLNESS  10/27/22 11:27 PM Kristin Boyd is a 41 y.o. female with a history of chronic pelvic pain due to endometriosis and kidney stones.  She has had frequent visits to the ED in the past several months due to pain from endometriosis, ovarian cyst or sciatica.  She is here with lower abdominal pain, bilateral back pain and bilateral hip pain that began several hours ago.  This pain is consistent with previous episodes of endometriosis.  Her endometriosis tends to cause pain and episodes like this.  She rates her pain as a 10 out of 10.  It is not worse with movement or palpation.  She cannot find a comfortable position.  She is not having nausea or vomiting with this.  She is status post endometrial ablation so does not have regular menstrual periods.   Past Medical History:  Diagnosis Date   Arthritis    Chronic knee pain    Chronic pelvic pain in female    Kidney stones    Obese    Ovarian cyst    Pneumonia due to COVID-19 virus 05/26/2019    Past Surgical History:  Procedure Laterality Date   CESAREAN SECTION     CHOLECYSTECTOMY     DILATION AND CURETTAGE OF UTERUS N/A 11/12/2021   Procedure: ATTEMPTED HYSTEROSCOPY;  Surgeon: Lafonda Mosses, MD;  Location: [facility];  Service: Gynecology;  Laterality: N/A;   ENDOMETRIAL ABLATION     WISDOM TOOTH EXTRACTION     2 removed   XI ROBOTIC ASSISTED SALPINGECTOMY N/A 11/12/2021   Procedure: DIAGNOSTIC LAPAROSCOPY, PERITONEAL BIOPSIES;  Surgeon: Lafonda Mosses, MD;  Location: [facility];  Service: Gynecology;  Laterality: N/A;    Family History  Problem Relation Age of Onset   Hypertension Father    Diabetes Father    Colon cancer Neg Hx    Breast cancer Neg Hx    Ovarian cancer Neg Hx    Endometrial cancer Neg Hx     Pancreatic cancer Neg Hx    Prostate cancer Neg Hx     Social History   Tobacco Use   Smoking status: Former   Smokeless tobacco: Never  Scientific laboratory technician Use: Never used  Substance Use Topics   Alcohol use: Yes    Comment: occasional   Drug use: Never    Prior to Admission medications   Medication Sig Start Date End Date Taking? Authorizing Provider  oxyCODONE-acetaminophen (PERCOCET) 10-325 MG tablet Take 1 tablet by mouth every 6 (six) hours as needed for pain. 10/28/22  Yes Stanton Kissoon, MD  amLODipine (NORVASC) 10 MG tablet Take 1 tablet (10 mg total) by mouth daily. 10/28/22   Keiondra Brookover, MD  Diclofenac Sodium CR 100 MG 24 hr tablet Take 1 tablet (100 mg total) by mouth daily. 09/26/22   Palumbo, April, MD  naproxen (NAPROSYN) 500 MG tablet Take 1 tablet (500 mg total) by mouth 2 (two) times daily. 09/24/22   Veryl Speak, MD  ondansetron (ZOFRAN-ODT) 4 MG disintegrating tablet Take 1 tablet (4 mg total) by mouth every 8 (eight) hours as needed for nausea or vomiting. 06/28/22   Rancour, Annie Main, MD    Allergies Peanut (diagnostic) and Penicillins   REVIEW OF SYSTEMS  Negative except as noted here or in the History of Present  Illness.   PHYSICAL EXAMINATION  Initial Vital Signs Blood pressure (!) 213/107, pulse 85, temperature 98.5 F (36.9 C), temperature source Oral, resp. rate 20, SpO2 93 %.  Examination General: Well-developed, high BMI female in no acute distress; appearance consistent with age of record HENT: normocephalic; atraumatic Eyes: Normal appearance Neck: supple Heart: regular rate and rhythm Lungs: clear to auscultation bilaterally Abdomen: soft; nondistended; nontender; bowel sounds present Extremities: No deformity; full range of motion; pulses normal Neurologic: Awake, alert and oriented; motor function intact in all extremities and symmetric; no facial droop Skin: Warm and dry Psychiatric: Whimpering   RESULTS  Summary of this  visit's results, reviewed and interpreted by myself:   EKG Interpretation  Date/Time:    Ventricular Rate:    PR Interval:    QRS Duration:   QT Interval:    QTC Calculation:   R Axis:     Text Interpretation:         Laboratory Studies: Results for orders placed or performed during the hospital encounter of 10/27/22 (from the past 24 hour(s))  CBC with Differential     Status: Abnormal   Collection Time: 10/27/22 11:01 PM  Result Value Ref Range   WBC 12.9 (H) 4.0 - 10.5 K/uL   RBC 5.05 3.87 - 5.11 MIL/uL   Hemoglobin 13.6 12.0 - 15.0 g/dL   HCT 41.6 36.0 - 46.0 %   MCV 82.4 80.0 - 100.0 fL   MCH 26.9 26.0 - 34.0 pg   MCHC 32.7 30.0 - 36.0 g/dL   RDW 15.5 11.5 - 15.5 %   Platelets 284 150 - 400 K/uL   nRBC 0.0 0.0 - 0.2 %   Neutrophils Relative % 74 %   Neutro Abs 9.6 (H) 1.7 - 7.7 K/uL   Lymphocytes Relative 19 %   Lymphs Abs 2.5 0.7 - 4.0 K/uL   Monocytes Relative 5 %   Monocytes Absolute 0.7 0.1 - 1.0 K/uL   Eosinophils Relative 1 %   Eosinophils Absolute 0.1 0.0 - 0.5 K/uL   Basophils Relative 1 %   Basophils Absolute 0.1 0.0 - 0.1 K/uL   Immature Granulocytes 0 %   Abs Immature Granulocytes 0.02 0.00 - 0.07 K/uL  Basic metabolic panel     Status: Abnormal   Collection Time: 10/27/22 11:01 PM  Result Value Ref Range   Sodium 135 135 - 145 mmol/L   Potassium 4.4 3.5 - 5.1 mmol/L   Chloride 106 98 - 111 mmol/L   CO2 19 (L) 22 - 32 mmol/L   Glucose, Bld 104 (H) 70 - 99 mg/dL   BUN 8 6 - 20 mg/dL   Creatinine, Ser 0.68 0.44 - 1.00 mg/dL   Calcium 8.5 (L) 8.9 - 10.3 mg/dL   GFR, Estimated >60 >60 mL/min   Anion gap 10 5 - 15  hCG, serum, qualitative     Status: None   Collection Time: 10/27/22 11:01 PM  Result Value Ref Range   Preg, Serum NEGATIVE NEGATIVE  Urinalysis, Routine w reflex microscopic     Status: Abnormal   Collection Time: 10/28/22  2:12 AM  Result Value Ref Range   Color, Urine YELLOW YELLOW   APPearance CLEAR CLEAR   Specific Gravity,  Urine 1.020 1.005 - 1.030   pH 7.5 5.0 - 8.0   Glucose, UA NEGATIVE NEGATIVE mg/dL   Hgb urine dipstick NEGATIVE NEGATIVE   Bilirubin Urine NEGATIVE NEGATIVE   Ketones, ur 15 (A) NEGATIVE mg/dL   Protein, ur NEGATIVE  NEGATIVE mg/dL   Nitrite NEGATIVE NEGATIVE   Leukocytes,Ua NEGATIVE NEGATIVE   Imaging Studies: No results found.  ED COURSE and MDM  Nursing notes, initial and subsequent vitals signs, including pulse oximetry, reviewed and interpreted by myself.  Vitals:   10/27/22 2259 10/28/22 0100  BP: (!) 213/107 (!) 178/110  Pulse: 85 83  Resp: 20 17  Temp: 98.5 F (36.9 C)   TempSrc: Oral   SpO2: 93% 93%   Medications  HYDROmorphone (DILAUDID) injection 1 mg (has no administration in time range)  ondansetron (ZOFRAN) injection 4 mg (4 mg Intravenous Given 10/27/22 2355)  HYDROmorphone (DILAUDID) injection 1 mg (1 mg Intravenous Given 10/27/22 2355)   2:17 AM The patient's pain is consistent with her usual episodes of endometriosis.  I do not believe imaging would be of benefit.  There is no blood in her urine to suggest she is passing a stone although she does have known left renal stones.  She has had multiple abdominal and pelvic CTs and ultrasounds in the past 6 months alone.  Endometriosis pain as a form of chronic pain and she needs to establish with a chronic pain management specialist.  The patient tells me she has no history of hypertension but her blood pressure was significantly elevated here and her medication list indicates that she, at least formally, was prescribed Norvasc.  We will restart her Norvasc as her blood pressure is not currently controlled and we will have her follow-up with her primary care provider.   PROCEDURES  Procedures   ED DIAGNOSES     ICD-10-CM   1. Chronic pelvic pain in female  R10.2    G89.29     2. Hypertension not at goal  [facility], Jenny Reichmann, MD 10/28/22 [phone]

## 2022-10-27 NOTE — ED Triage Notes (Signed)
Brought by EMS for back pain radiating to the abdomen and hips. Denies trauma . H/x endometriosis, ovarian cyst and kidney stones .

## 2022-10-28 LAB — URINALYSIS, ROUTINE W REFLEX MICROSCOPIC
Bilirubin Urine: NEGATIVE
Glucose, UA: NEGATIVE mg/dL
Hgb urine dipstick: NEGATIVE
Ketones, ur: 15 mg/dL — AB
Leukocytes,Ua: NEGATIVE
Nitrite: NEGATIVE
Protein, ur: NEGATIVE mg/dL
Specific Gravity, Urine: 1.02 (ref 1.005–1.030)
pH: 7.5 (ref 5.0–8.0)

## 2022-10-28 MED ORDER — OXYCODONE-ACETAMINOPHEN 10-325 MG PO TABS: 1.0000 | ORAL_TABLET | Freq: Four times a day (QID) | ORAL | 0 refills | Status: DC | PRN

## 2022-10-28 MED ORDER — HYDROMORPHONE HCL 1 MG/ML IJ SOLN
1.0000 mg | Freq: Once | INTRAMUSCULAR | Status: AC
  Administered 2022-10-28: 1 mg via INTRAVENOUS
  Filled 2022-10-28: qty 1

## 2022-10-28 MED ORDER — AMLODIPINE BESYLATE 10 MG PO TABS: 10.0000 mg | ORAL_TABLET | Freq: Every day | ORAL | 0 refills | Status: DC

## 2022-10-31 ENCOUNTER — Encounter (HOSPITAL_BASED_OUTPATIENT_CLINIC_OR_DEPARTMENT_OTHER): Payer: Self-pay | Admitting: Emergency Medicine

## 2022-10-31 ENCOUNTER — Other Ambulatory Visit: Payer: Self-pay

## 2022-10-31 ENCOUNTER — Emergency Department (HOSPITAL_BASED_OUTPATIENT_CLINIC_OR_DEPARTMENT_OTHER)
Admission: EM | Admit: 2022-10-31 | Discharge: 2022-10-31 | Disposition: A | Discharge status: Home / Self Care | Payer: Commercial Managed Care - HMO | Attending: Emergency Medicine | Admitting: Emergency Medicine

## 2022-10-31 DIAGNOSIS — R1084 Generalized abdominal pain: Secondary | ICD-10-CM

## 2022-10-31 DIAGNOSIS — Z91010 Allergy to peanuts: Secondary | ICD-10-CM | POA: Insufficient documentation

## 2022-10-31 DIAGNOSIS — M545 Low back pain, unspecified: Secondary | ICD-10-CM | POA: Diagnosis not present

## 2022-10-31 DIAGNOSIS — R109 Unspecified abdominal pain: Secondary | ICD-10-CM | POA: Diagnosis present

## 2022-10-31 DIAGNOSIS — Z79899 Other long term (current) drug therapy: Secondary | ICD-10-CM | POA: Insufficient documentation

## 2022-10-31 DIAGNOSIS — I10 Essential (primary) hypertension: Secondary | ICD-10-CM | POA: Diagnosis not present

## 2022-10-31 DIAGNOSIS — R102 Pelvic and perineal pain: Secondary | ICD-10-CM | POA: Insufficient documentation

## 2022-10-31 LAB — URINALYSIS, ROUTINE W REFLEX MICROSCOPIC
Bilirubin Urine: NEGATIVE
Glucose, UA: NEGATIVE mg/dL
Ketones, ur: 15 mg/dL — AB
Leukocytes,Ua: NEGATIVE
Nitrite: NEGATIVE
Protein, ur: >=300 mg/dL — AB
Specific Gravity, Urine: >=1.03 (ref 1.005–1.030)
pH: 7 (ref 5.0–8.0)

## 2022-10-31 LAB — PREGNANCY, URINE: Preg Test, Ur: NEGATIVE

## 2022-10-31 LAB — URINALYSIS, MICROSCOPIC (REFLEX): WBC, UA: NONE SEEN WBC/hpf (ref 0–5)

## 2022-10-31 MED ORDER — FENTANYL CITRATE PF 50 MCG/ML IJ SOSY
100.0000 ug | PREFILLED_SYRINGE | Freq: Once | INTRAMUSCULAR | Status: AC
  Administered 2022-10-31: 100 ug via INTRAMUSCULAR
  Filled 2022-10-31: qty 2

## 2022-10-31 NOTE — ED Provider Notes (Signed)
Kristin Boyd Provider Note   CSN: [id number] Arrival date & time: 10/31/22  1804     History  Chief Complaint  Patient presents with   Abdominal Pain    Kristin Boyd is a 41 y.o. female.  41 year old female brought in by EMS with complaint of abdominal pain, low back pain radiating to bilateral legs with history of endometriosis and ovarian cyst, pain consistent with prior pain episodes, unrelieved with her Percocet 09/09/2024 at home from prior ER visit.  Denies any new symptoms or changes in her chronic pain.  Patient was seen by gynecology who offered removal of left ovarian cyst as well as hysterectomy however she was then diagnosed with endometriosis throughout her abdominal cavity and surgery was put on hold.  Patient is currently not scheduled follow-up with her gynecologist until February.  She denies fevers, chills, nausea, vomiting, changes in bowel or bladder habits.  No other complaints or concerns.       Home Medications Prior to Admission medications   Medication Sig Start Date End Date Taking? Authorizing Provider  amLODipine (NORVASC) 10 MG tablet Take 1 tablet (10 mg total) by mouth daily. 10/28/22   Molpus, John, MD  Diclofenac Sodium CR 100 MG 24 hr tablet Take 1 tablet (100 mg total) by mouth daily. 09/26/22   Palumbo, April, MD  naproxen (NAPROSYN) 500 MG tablet Take 1 tablet (500 mg total) by mouth 2 (two) times daily. 09/24/22   Veryl Speak, MD  ondansetron (ZOFRAN-ODT) 4 MG disintegrating tablet Take 1 tablet (4 mg total) by mouth every 8 (eight) hours as needed for nausea or vomiting. 06/28/22   Rancour, Annie Main, MD  oxyCODONE-acetaminophen (PERCOCET) 10-325 MG tablet Take 1 tablet by mouth every 6 (six) hours as needed for pain. 10/28/22   Molpus, John, MD      Allergies    Peanut (diagnostic) and Penicillins    Review of Systems   Review of Systems Negative except as per HPI Physical Exam Updated Vital Signs BP (!)  191/87   Pulse 96   Temp 99.7 F (37.6 C)   Resp (!) 22   Ht '5\' 9"'$  (1.753 m)   Wt (!) 148.3 kg   SpO2 97%   BMI 48.28 kg/m  Physical Exam Vitals and nursing note reviewed.  Constitutional:      General: She is not in acute distress.    Appearance: She is well-developed. She is not diaphoretic.     Comments: Moaning in pain  HENT:     Head: Normocephalic and atraumatic.  Cardiovascular:     Rate and Rhythm: Normal rate and regular rhythm.     Pulses: Normal pulses.     Heart sounds: Normal heart sounds.  Pulmonary:     Effort: Pulmonary effort is normal.     Breath sounds: Normal breath sounds.  Abdominal:     Palpations: Abdomen is soft.     Tenderness: There is abdominal tenderness in the right lower quadrant, suprapubic area and left lower quadrant. There is no right CVA tenderness or left CVA tenderness.  Musculoskeletal:     Right lower leg: No edema.     Left lower leg: No edema.  Skin:    General: Skin is warm and dry.     Findings: No erythema or rash.  Neurological:     Mental Status: She is alert and oriented to person, place, and time.  Psychiatric:        Behavior: Behavior normal.  ED Results / Procedures / Treatments   Labs (all labs ordered are listed, but only abnormal results are displayed) Labs Reviewed  URINALYSIS, ROUTINE W REFLEX MICROSCOPIC - Abnormal; Notable for the following components:      Result Value   Hgb urine dipstick TRACE (*)    Ketones, ur 15 (*)    Protein, ur >=300 (*)    All other components within normal limits  URINALYSIS, MICROSCOPIC (REFLEX) - Abnormal; Notable for the following components:   Bacteria, UA RARE (*)    All other components within normal limits  PREGNANCY, URINE    EKG None  Radiology No results found.  Procedures Procedures    Medications Ordered in ED Medications  fentaNYL (SUBLIMAZE) injection 100 mcg (100 mcg Intramuscular Given 10/31/22 2115)    ED Course/ Medical Decision Making/  A&P Clinical Course as of 10/31/22 2157  Fri Oct 31, 2022  2106 BP repeat 191/87, states she just started her Norvasc from prior ER visit tonight. [LM]    Clinical Course User Index [LM] Tacy Learn, PA-C                           Medical Decision Making Amount and/or Complexity of Data Reviewed Labs: ordered.  Risk Prescription drug management.   41 year old female presents the emergency room via EMS with concern for pelvic pain.  Patient with ongoing pelvic pain secondary to large cyst and endometriosis.  Last seen in this emergency room on 10/27/2022, discharged with Percocet and Norvasc for her blood pressure.  Patient took her pain medication and blood pressure medication this evening (just started blood pressure medication this evening) without improvement in her pain which prompted her to call EMS to bring her to the emergency room.  She denies fevers, nausea, vomiting, change in bowel or bladder habits.  States that her pain tonight is similar to pain she has been dealing with for the last few months.  She was scheduled for hysterectomy and removal of the left ovarian cyst next month however due to diagnosis of endometriosis, states her surgery has been delayed and she is not currently scheduled to see her gynecologist until February. On exam, she is found to have pelvic pain across left and right sides of her abdomen, no CVA tenderness.  Lungs clear to auscultation.  Patient is moaning in bed and pain.  She is provided with fentanyl with improvement in her pain.  Blood pressure is improved as well and is currently 183/86, down from 195/142.  Plan is for discharge home to follow-up with her gynecologist as soon as possible with return to ER precautions provided.  Her urine does have a small amount of hemoglobin without evidence of infection, no significant red cells in her urine.  She did have a CT scan in the ER last on October 2023 showing a 7 mm left renal stone.  Given her diffuse  pain which is unchanged from prior, do not suspect she is passing the stone however if her pain continues and she returned to the ER, would consider repeat imaging or labs if indicated.        Final Clinical Impression(s) / ED Diagnoses Final diagnoses:  Generalized abdominal pain  Hypertension, unspecified type  Pelvic pain    Rx / DC Orders ED Discharge Orders     None         Tacy Learn, PA-C 10/31/22 2157    Kemper Durie, DO  10/31/22 2321  

## 2022-10-31 NOTE — Discharge Instructions (Signed)
Follow-up with your gynecologist as soon as possible.  Continue with your pain medication as prescribed. Continue with blood pressure medication as prescribed. Return to the ER for worsening or concerning symptoms.

## 2022-10-31 NOTE — ED Triage Notes (Signed)
Pt BIB EMS from home c/o BLLQ abdominal pain. Seen here and dx with endometriosis and ovarian cyst on 11/20. 1hr pta pain started. Pain unrelieved with prescription meds.

## 2022-11-07 ENCOUNTER — Encounter (HOSPITAL_BASED_OUTPATIENT_CLINIC_OR_DEPARTMENT_OTHER): Payer: Self-pay | Admitting: Emergency Medicine

## 2022-11-07 ENCOUNTER — Other Ambulatory Visit: Payer: Self-pay

## 2022-11-07 ENCOUNTER — Emergency Department (HOSPITAL_BASED_OUTPATIENT_CLINIC_OR_DEPARTMENT_OTHER)
Admission: EM | Admit: 2022-11-07 | Discharge: 2022-11-07 | Disposition: A | Discharge status: Home / Self Care | Payer: Commercial Managed Care - HMO | Attending: Emergency Medicine | Admitting: Emergency Medicine

## 2022-11-07 DIAGNOSIS — R1030 Lower abdominal pain, unspecified: Secondary | ICD-10-CM | POA: Diagnosis not present

## 2022-11-07 DIAGNOSIS — R102 Pelvic and perineal pain: Secondary | ICD-10-CM | POA: Diagnosis present

## 2022-11-07 LAB — URINALYSIS, ROUTINE W REFLEX MICROSCOPIC
Bilirubin Urine: NEGATIVE
Glucose, UA: NEGATIVE mg/dL
Hgb urine dipstick: NEGATIVE
Ketones, ur: NEGATIVE mg/dL
Leukocytes,Ua: NEGATIVE
Nitrite: NEGATIVE
Protein, ur: NEGATIVE mg/dL
Specific Gravity, Urine: 1.02 (ref 1.005–1.030)
pH: 8 (ref 5.0–8.0)

## 2022-11-07 LAB — PREGNANCY, URINE: Preg Test, Ur: NEGATIVE

## 2022-11-07 MED ORDER — KETOROLAC TROMETHAMINE 30 MG/ML IJ SOLN
30.0000 mg | Freq: Once | INTRAMUSCULAR | Status: AC
  Administered 2022-11-07: 30 mg via INTRAMUSCULAR
  Filled 2022-11-07: qty 1

## 2022-11-07 MED ORDER — OXYCODONE-ACETAMINOPHEN 5-325 MG PO TABS: 1.0000 | ORAL_TABLET | Freq: Four times a day (QID) | ORAL | 0 refills | Status: DC | PRN

## 2022-11-07 MED ORDER — SENNOSIDES-DOCUSATE SODIUM 8.6-50 MG PO TABS: 1.0000 | ORAL_TABLET | Freq: Every evening | ORAL | 0 refills | Status: DC | PRN

## 2022-11-07 MED ORDER — IBUPROFEN 800 MG PO TABS: 800.0000 mg | ORAL_TABLET | Freq: Three times a day (TID) | ORAL | 0 refills | Status: DC | PRN

## 2022-11-07 MED ORDER — OXYCODONE-ACETAMINOPHEN 5-325 MG PO TABS
1.0000 | ORAL_TABLET | Freq: Once | ORAL | Status: AC
  Administered 2022-11-07: 1 via ORAL
  Filled 2022-11-07: qty 1

## 2022-11-07 NOTE — ED Provider Notes (Signed)
Emergency Department Provider Note   I have reviewed the triage vital signs and the nursing notes.   HISTORY  Chief Complaint Abdominal Pain   HPI Kenzie Thoreson is a 41 y.o. female with past history of endometriosis, ovarian cyst, chronic pelvic pain presents to the ED with return of pelvic pain.  Symptoms began today and are diffuse and radiating to the left.  No vaginal bleeding or discharge.  Patient is following with OB/GYN for this. Patient with multiple ED visits with similar pain. No fever or chills.   Past Medical History:  Diagnosis Date   Arthritis    Chronic knee pain    Chronic pelvic pain in female    Kidney stones    Obese    Ovarian cyst    Pneumonia due to COVID-19 virus 05/26/2019    Review of Systems  Constitutional: No fever/chills Eyes: No visual changes. ENT: No sore throat. Cardiovascular: Denies chest pain. Respiratory: Denies shortness of breath. Gastrointestinal: Positive lower abdominal pain.  No nausea, no vomiting.  No diarrhea.  No constipation. Genitourinary: Negative for dysuria. Musculoskeletal: Negative for back pain. Skin: Negative for rash. Neurological: Negative for headaches, focal weakness or numbness.  ____________________________________________   PHYSICAL EXAM:  VITAL SIGNS: ED Triage Vitals  Enc Vitals Group     BP 11/07/22 1843 (!) 186/101     Pulse Rate 11/07/22 1837 100     Resp 11/07/22 1837 (!) 22     Temp 11/07/22 1837 98.1 F (36.7 C)     Temp Source 11/07/22 1837 Oral     SpO2 11/07/22 1836 100 %     Weight --      Height 11/07/22 1839 '5\' 9"'$  (1.753 m)   Constitutional: Alert and oriented. Well appearing and in no acute distress. Eyes: Conjunctivae are normal.  Head: Atraumatic. Nose: No congestion/rhinnorhea. Mouth/Throat: Mucous membranes are moist.   Neck: No stridor.   Cardiovascular: Normal rate, regular rhythm. Good peripheral circulation. Grossly normal heart sounds.   Respiratory: Normal  respiratory effort.  No retractions. Lungs CTAB. Gastrointestinal: Soft with mild lower abdominal tenderness. No peritonitis. No distention.  Musculoskeletal: No gross deformities of extremities. Neurologic:  Normal speech and language.  Skin:  Skin is warm, dry and intact. No rash noted.  ____________________________________________   LABS (all labs ordered are listed, but only abnormal results are displayed)  Labs Reviewed  URINALYSIS, ROUTINE W REFLEX MICROSCOPIC  PREGNANCY, URINE    ____________________________________________   PROCEDURES  Procedure(s) performed:   Procedures  None  ____________________________________________   INITIAL IMPRESSION / ASSESSMENT AND PLAN / ED COURSE  Pertinent labs & imaging results that were available during my care of the patient were reviewed by me and considered in my medical decision making (see chart for details).   This patient is Presenting for Evaluation of abdominal pain, which does require a range of treatment options, and is a complaint that involves a high risk of morbidity and mortality.  The Differential Diagnoses includes but is not exclusive to ectopic pregnancy, ovarian cyst, ovarian torsion, acute appendicitis, urinary tract infection, endometriosis, bowel obstruction, hernia, colitis, renal colic, gastroenteritis, volvulus etc.   Critical Interventions-    Medications  oxyCODONE-acetaminophen (PERCOCET/ROXICET) 5-325 MG per tablet 1 tablet (1 tablet Oral Given 11/07/22 2156)  ketorolac (TORADOL) 30 MG/ML injection 30 mg (30 mg Intramuscular Given 11/07/22 2255)    Reassessment after intervention: Symptoms improved.   I decided to review pertinent External Data, and in summary patient with multiple ED  presentations for similar this year including 4 in the past 2 months.  She has had CT imaging on multiple occasions as well as multiple transvaginal ultrasounds.   Clinical Laboratory Tests Ordered, included pregnancy  negative. Normal UA.   Radiologic Tests: Considered repeat imaging such as CT area abdominal ultrasound but patient responding well to treatment here.  Exam is reassuring.  Do not see indication for emergent transfer for pelvic ultrasound or repeat radiation exposure from CT given presentation.  Medical Decision Making: Summary:  Patient presents emergency department with lower abdominal pain similar to prior.  Exam is reassuring.  Pregnancy negative.  No UTI.   Reevaluation with update and discussion with patient.  She is feeling much better after pain medication.  Discussed follow-up plan including follow-up with OB/GYN.  She does have a scheduled pelvic ultrasound upcoming in the next couple of weeks.  We discussed tricked ED return precaution.  Disposition: discahrge  ____________________________________________  FINAL CLINICAL IMPRESSION(S) / ED DIAGNOSES  Final diagnoses:  Lower abdominal pain     NEW OUTPATIENT MEDICATIONS STARTED DURING THIS VISIT:  Discharge Medication List as of 11/07/2022 10:48 PM     START taking these medications   Details  ibuprofen (ADVIL) 800 MG tablet Take 1 tablet (800 mg total) by mouth every 8 (eight) hours as needed for moderate pain., Starting Fri 11/07/2022, Normal    oxyCODONE-acetaminophen (PERCOCET/ROXICET) 5-325 MG tablet Take 1 tablet by mouth every 6 (six) hours as needed for severe pain., Starting Fri 11/07/2022, Normal    senna-docusate (SENOKOT-S) 8.6-50 MG tablet Take 1 tablet by mouth at bedtime as needed for mild constipation., Starting Fri 11/07/2022, Normal        Note:  This document was prepared using Dragon voice recognition software and may include unintentional dictation errors.  Nanda Quinton, MD, [facility]    Patrycja Mumpower, [facility], MD 11/09/22 [id number]

## 2022-11-07 NOTE — ED Notes (Signed)
Pt. Reports history of endometriosis with cyst and has had surgical appts and consults last year with no follow thru.  Pt. Is feeling same pain she reports and has an appt. With GYN in Dec.2023 per Pt.

## 2022-11-07 NOTE — Discharge Instructions (Signed)
He was seen in the emergency department today with return of your lower abdominal discomfort.  Please continue following with your OB/GYN.  I have called in some pain medications to your pharmacy but she cannot drive a car if you are taking his medicines.  This can also cause constipation as well as dependence on this medicine.  I have called in some constipation medicine for you as well.  Please return with any new or suddenly worsening symptoms.

## 2022-11-07 NOTE — ED Notes (Signed)
Arrived via [facility] with lower Abd pain. Eval in ED last week for same. Scheduled to see GYN MD in Jan.

## 2022-11-07 NOTE — ED Notes (Signed)
Pt. Reports pain started earlier this evening but this same pain has been going on for a year and a half.

## 2022-11-07 NOTE — ED Notes (Signed)
Pain is located in her in lower abd L to R and in low back L to R.

## 2022-11-07 NOTE — ED Triage Notes (Signed)
BIB EMS due to lower abd and back pain. Pt states hx of same.

## 2022-11-20 ENCOUNTER — Telehealth: Payer: Self-pay

## 2022-11-20 NOTE — Telephone Encounter (Signed)
Could you confirm with the patient what her current insurance is? And once we know, check with [facility] if they take it? Thank you

## 2022-11-20 NOTE — Telephone Encounter (Signed)
I spoke with Dr. Edwyna Ready about discussing the case with one of [facility]/endometriosis specialists at [facility]. I have reached out and am waiting for a phone call back.

## 2022-11-20 NOTE — Telephone Encounter (Signed)
Olivia Mackie from [facility] called to follow up on patient getting scheduled for surgery at [facility]. Per Dr. Edwyna Ready, he spoke to Dr. Berline Lopes on Friday 12/8 and they discussed pt.    [facility] office will look into this and call her back once discussed with Dr. Berline Lopes for a plan. CB# [phone]

## 2022-11-20 NOTE — Telephone Encounter (Signed)
Pt has [place].  I connected with [facility], their office does not take pt's insurance.  Dr.Tucker notified

## 2022-11-21 ENCOUNTER — Telehealth: Payer: Self-pay | Admitting: *Deleted

## 2022-11-21 ENCOUNTER — Encounter: Payer: Self-pay | Admitting: Gynecologic Oncology

## 2022-11-21 NOTE — Telephone Encounter (Signed)
Per Dr Berline Lopes patient scheduled for a phone visit on 12/22

## 2022-11-28 ENCOUNTER — Encounter: Payer: Self-pay | Admitting: Gynecologic Oncology

## 2022-11-28 ENCOUNTER — Inpatient Hospital Stay: Payer: Commercial Managed Care - HMO | Attending: Gynecologic Oncology | Admitting: Gynecologic Oncology

## 2022-11-28 DIAGNOSIS — N809 Endometriosis, unspecified: Secondary | ICD-10-CM

## 2022-11-28 DIAGNOSIS — N9489 Other specified conditions associated with female genital organs and menstrual cycle: Secondary | ICD-10-CM | POA: Diagnosis not present

## 2022-11-28 DIAGNOSIS — Z7189 Other specified counseling: Secondary | ICD-10-CM

## 2022-11-28 NOTE — Progress Notes (Signed)
Gynecologic Oncology Telehealth Note: Gyn-Onc  I connected with Kristin Boyd on 11/28/22 at  4:35 PM EST by telephone and verified that I am speaking with the correct person using two identifiers.  I discussed the limitations, risks, security and privacy concerns of performing an evaluation and management service by telemedicine and the availability of in-person appointments. I also discussed with the patient that there may be a patient responsible charge related to this service. The patient expressed understanding and agreed to proceed.  Other persons participating in the visit and their role in the encounter: None.  Patient's location: Home Provider's location: [place]  Reason for Visit: Follow-up in the setting of advanced endometriosis  Treatment History: Patient's history is notable for an endometrial ablation at the age of 41.  She describes her periods as being quite heavy at this time and occurring sometimes multiple times a month.  She then had intermittent pain after the ablation but then in 2019 started to have progressive pain.  She ultimately underwent imaging with CT scan and pelvic ultrasound in August 2020 which showed an 8.3 x 6.6 x 8.9 cm complex cystic and solid left adnexal mass.  Follow-up ultrasound showed a complex and cystic lesion in the left adnexa with a tubular shape measuring 12.6 x 5.6 x 7.4 cm; mass on imaging was felt to perhaps represent a tubo-ovarian abscess.  CA125 was obtained at that time and was mildly elevated at 51.  The plan at that time was for referral to GYN oncology, which did not appear to have happened.     She then had multiple visits in the emergency department with persistence of the mass noted.  Most recently, she had a consultation with Dr. Gwenlyn Found at [facility] with a plan to proceed with surgical excision.  Most recent imaging by ultrasound in mid October showed a left adnexal mass measuring 10.3 x 11.4 x 11.2 cm with  associated septation and vascularity although poorly visualized.  Patient was seen in our emergency department on the fifth of this month.  She underwent CT and pelvic ultrasound which show persistent mass without significant change in size since most recent ultrasound.     11/12/2021: Diagnostic surgery for a complex adnexal mass and thickened endometrium.  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  She was on Orilissa for some time with good relief but unfortunately this stopped being covered by her insurance.  She then tried norethindrone (didn't provide much relief).   Interval History: Doing well. Having [place] currently less pain since appt with Dr. Ronita Hipps when she was start on Myfembree. She is taking it at night. Helping with pain, using ibuprofen and tylenol for pain. Denies bleeding. Occasionally will have pressure/tightness all over her abdomen. Bowels moving normally. Denies urinary symptoms.  Her last weight at 328 lbs, working on slow and steady weight loss. Watching what she eats, exercises (walking every day).   Past Medical/Surgical History: Past Medical History:  Diagnosis Date   Arthritis    Chronic knee pain    Chronic pelvic pain in female    Kidney stones    Obese    Ovarian cyst    Pneumonia due to COVID-19 virus 05/26/2019    Past Surgical History:  Procedure Laterality Date   CESAREAN SECTION     CHOLECYSTECTOMY     DILATION AND CURETTAGE OF UTERUS N/A 11/12/2021   Procedure: ATTEMPTED HYSTEROSCOPY;  Surgeon: Lafonda Mosses, MD;  Location: [facility];  Service: Gynecology;  Laterality: N/A;   ENDOMETRIAL ABLATION     WISDOM TOOTH EXTRACTION     2 removed   XI ROBOTIC ASSISTED SALPINGECTOMY N/A 11/12/2021   Procedure: DIAGNOSTIC LAPAROSCOPY, PERITONEAL BIOPSIES;  Surgeon: Lafonda Mosses, MD;  Location: [facility];  Service: Gynecology;   Laterality: N/A;    Family History  Problem Relation Age of Onset   Hypertension Father    Diabetes Father    Colon cancer Neg Hx    Breast cancer Neg Hx    Ovarian cancer Neg Hx    Endometrial cancer Neg Hx    Pancreatic cancer Neg Hx    Prostate cancer Neg Hx     Social History   Socioeconomic History   Marital status: Single    Spouse name: Not on file   Number of children: Not on file   Years of education: Not on file   Highest education level: Not on file  Occupational History   Not on file  Tobacco Use   Smoking status: Former   Smokeless tobacco: Never  Vaping Use   Vaping Use: Never used  Substance and Sexual Activity   Alcohol use: Yes    Comment: occasional   Drug use: Never   Sexual activity: Not on file  Other Topics Concern   Not on file  Social History Narrative   Not on file   Social Determinants of Health   Financial Resource Strain: Not on file  Food Insecurity: Not on file  Transportation Needs: Not on file  Physical Activity: Not on file  Stress: Not on file  Social Connections: Not on file    Current Medications:  Current Outpatient Medications:    amLODipine (NORVASC) 10 MG tablet, Take 1 tablet (10 mg total) by mouth daily., Disp: 30 tablet, Rfl: 0   Diclofenac Sodium CR 100 MG 24 hr tablet, Take 1 tablet (100 mg total) by mouth daily., Disp: 10 tablet, Rfl: 0   ibuprofen (ADVIL) 800 MG tablet, Take 1 tablet (800 mg total) by mouth every 8 (eight) hours as needed for moderate pain., Disp: 21 tablet, Rfl: 0   naproxen (NAPROSYN) 500 MG tablet, Take 1 tablet (500 mg total) by mouth 2 (two) times daily., Disp: 20 tablet, Rfl: 0   ondansetron (ZOFRAN-ODT) 4 MG disintegrating tablet, Take 1 tablet (4 mg total) by mouth every 8 (eight) hours as needed for nausea or vomiting., Disp: 20 tablet, Rfl: 0   oxyCODONE-acetaminophen (PERCOCET/ROXICET) 5-325 MG tablet, Take 1 tablet by mouth every 6 (six) hours as needed for severe pain., Disp: 10  tablet, Rfl: 0   senna-docusate (SENOKOT-S) 8.6-50 MG tablet, Take 1 tablet by mouth at bedtime as needed for mild constipation., Disp: 20 tablet, Rfl: 0  Review of Symptoms: Pertinent positives as per HPI.  Physical Exam: Deferred given limitations of phone visit.  Laboratory & Radiologic Studies: None new  Assessment & Plan: Kristin Boyd is a 41 y.o. woman with Stage IV endometriosis.  Patient has had significant improvement since starting Myfembree.  Discussed with her continuing on this medication as well as using over-the-counter medications for pain if needed.  If for some reason she is not able to continue on this, I have recommended trial of Lupron for complete ovarian suppression with add back therapy as needed (such as norethindrone).  I would recommend that this happens before any further consideration of surgical intervention.  She and I discussed again  the potential morbidity associated with taking her for surgery.  Based on findings from surgery last year, I think that excision of her endometriosis will likely involve bowel resection.  I congratulated her on her weight loss thus far and courage continued work towards weight loss.  I will have my office work on seeing whether Lupron would be covered by her insurance.  I will also asked my office to reach out to her OB/GYN as it may be less expensive to get the Lupron outside of the cancer center.  I discussed the assessment and treatment plan with the patient. The patient was provided with an opportunity to ask questions and all were answered. The patient agreed with the plan and demonstrated an understanding of the instructions.   The patient was advised to call back or see an in-person evaluation if the symptoms worsen or if the condition fails to improve as anticipated.   12 minutes of total time was spent for this patient encounter, including preparation, phone counseling with the patient and coordination of care, and  documentation of the encounter.   Jeral Pinch, MD  Division of Gynecologic Oncology  Department of Obstetrics and Gynecology  [facility]

## 2022-12-02 ENCOUNTER — Telehealth: Payer: Self-pay | Admitting: *Deleted

## 2022-12-02 NOTE — Telephone Encounter (Signed)
Per Dr Berline Lopes, patient scheduled for a phone visit on 2/28

## 2022-12-08 ENCOUNTER — Emergency Department (HOSPITAL_BASED_OUTPATIENT_CLINIC_OR_DEPARTMENT_OTHER)
Admission: EM | Admit: 2022-12-08 | Discharge: 2022-12-08 | Disposition: A | Discharge status: Home / Self Care | Payer: Commercial Managed Care - HMO | Attending: Emergency Medicine | Admitting: Emergency Medicine

## 2022-12-08 ENCOUNTER — Other Ambulatory Visit: Payer: Self-pay

## 2022-12-08 ENCOUNTER — Encounter (HOSPITAL_BASED_OUTPATIENT_CLINIC_OR_DEPARTMENT_OTHER): Payer: Self-pay | Admitting: Pediatrics

## 2022-12-08 DIAGNOSIS — M5442 Lumbago with sciatica, left side: Secondary | ICD-10-CM

## 2022-12-08 DIAGNOSIS — M5441 Lumbago with sciatica, right side: Secondary | ICD-10-CM | POA: Diagnosis present

## 2022-12-08 DIAGNOSIS — Z91010 Allergy to peanuts: Secondary | ICD-10-CM | POA: Insufficient documentation

## 2022-12-08 MED ORDER — LIDOCAINE 5 % EX PTCH: 1.0000 | MEDICATED_PATCH | CUTANEOUS | Status: DC

## 2022-12-08 MED ORDER — KETOROLAC TROMETHAMINE 15 MG/ML IJ SOLN
15.0000 mg | Freq: Once | INTRAMUSCULAR | Status: AC
  Administered 2022-12-08: 15 mg via INTRAVENOUS
  Filled 2022-12-08: qty 1

## 2022-12-08 MED ORDER — NAPROXEN 375 MG PO TABS: 375.0000 mg | ORAL_TABLET | Freq: Two times a day (BID) | ORAL | 0 refills | Status: DC

## 2022-12-08 MED ORDER — PREDNISONE 10 MG (21) PO TBPK: ORAL_TABLET | Freq: Every day | ORAL | 0 refills | Status: DC

## 2022-12-08 MED ORDER — LIDOCAINE 5 % EX PTCH: 1.0000 | MEDICATED_PATCH | CUTANEOUS | 0 refills | Status: DC

## 2022-12-08 NOTE — Discharge Instructions (Signed)
Your exam today was overall reassuring.  No concerning cause of a serious cause of your back pain.  We will treat you with prednisone Dosepak, naproxen, and provide you with lidocaine patch.  For any worsening symptoms please return to the emergency department otherwise follow-up with your primary care provider.

## 2022-12-08 NOTE — ED Triage Notes (Signed)
C/O low back pain radiating down both legs; denies hx of fall; reports hx of endometriosis and cyst in left ovary. Denies painful urination. Takes aleeve / motrin with no relief.

## 2022-12-08 NOTE — ED Notes (Signed)
Patient attempted to urinate Unable to urinate at this time

## 2022-12-08 NOTE — ED Provider Notes (Signed)
[facility] Provider Note   CSN: [id number] Arrival date & time: 12/08/22  8299     History  Chief Complaint  Patient presents with   Back Pain    Kristin Boyd is a 42 y.o. female.  42 year old female presents today for evaluation of bilateral low back pain with radiation down the bilateral lower legs.  She states this is something that has been on and off since early 2000's however this particular episode has been going on for the past 2 to 3 days.  She has been taking over-the-counter medications without significant relief.  She denies unintentional weight loss, fever, history of IV drug use, saddle anesthesia, weakness, history of malignancy.  Denies any recent injury.  She does have history of endometriosis and deals with chronic pelvic pain.  States that is not the pain that she is worried about today.  This pain is different.  Denies difficulty with bladder or bowel control.  The history is provided by the patient. No language interpreter was used.       Home Medications Prior to Admission medications   Medication Sig Start Date End Date Taking? Authorizing Provider  amLODipine (NORVASC) 10 MG tablet Take 1 tablet (10 mg total) by mouth daily. 10/28/22   Molpus, John, MD  Diclofenac Sodium CR 100 MG 24 hr tablet Take 1 tablet (100 mg total) by mouth daily. 09/26/22   Palumbo, April, MD  ibuprofen (ADVIL) 800 MG tablet Take 1 tablet (800 mg total) by mouth every 8 (eight) hours as needed for moderate pain. 11/07/22   Long, Wonda Olds, MD  naproxen (NAPROSYN) 500 MG tablet Take 1 tablet (500 mg total) by mouth 2 (two) times daily. 09/24/22   Veryl Speak, MD  ondansetron (ZOFRAN-ODT) 4 MG disintegrating tablet Take 1 tablet (4 mg total) by mouth every 8 (eight) hours as needed for nausea or vomiting. 06/28/22   Rancour, Annie Main, MD  oxyCODONE-acetaminophen (PERCOCET/ROXICET) 5-325 MG tablet Take 1 tablet by mouth every 6 (six) hours as needed for  severe pain. 11/07/22   Long, Wonda Olds, MD  senna-docusate (SENOKOT-S) 8.6-50 MG tablet Take 1 tablet by mouth at bedtime as needed for mild constipation. 11/07/22   Long, Wonda Olds, MD      Allergies    Peanut (diagnostic) and Penicillins    Review of Systems   Review of Systems  Constitutional:  Negative for chills and fever.  Gastrointestinal:  Negative for abdominal pain and nausea.  Genitourinary:  Negative for difficulty urinating, dysuria and flank pain.  Musculoskeletal:  Positive for back pain.  All other systems reviewed and are negative.   Physical Exam Updated Vital Signs BP (!) 170/90 (BP Location: Left Arm)   Pulse 77   Temp 98.2 F (36.8 C) (Oral)   Resp 18   Ht '5\' 9"'$  (1.753 m)   Wt (!) 148.8 kg   SpO2 99%   BMI 48.44 kg/m  Physical Exam Vitals and nursing note reviewed.  Constitutional:      General: She is not in acute distress.    Appearance: Normal appearance. She is not ill-appearing.  HENT:     Head: Normocephalic and atraumatic.     Nose: Nose normal.  Eyes:     General: No scleral icterus.    Extraocular Movements: Extraocular movements intact.     Conjunctiva/sclera: Conjunctivae normal.  Cardiovascular:     Rate and Rhythm: Normal rate and regular rhythm.     Pulses: Normal pulses.  Pulmonary:  Effort: Pulmonary effort is normal. No respiratory distress.     Breath sounds: Normal breath sounds. No wheezing or rales.  Abdominal:     General: There is no distension.     Tenderness: There is no abdominal tenderness.  Musculoskeletal:        General: Normal range of motion.     Cervical back: Normal range of motion.     Comments: Full range of motion in bilateral upper and lower extremities with 5/5 strength in extensor and flexor muscle groups.  2+ DP pulse present bilaterally.  Cervical, thoracic, lumbar spine without tenderness to palpation.  Patient is able to ambulate without difficulty.  Fully able to flex at the lumbar spine without  difficulty.  She is able to squat without much difficulty.  Skin:    General: Skin is warm and dry.  Neurological:     General: No focal deficit present.     Mental Status: She is alert. Mental status is at baseline.     ED Results / Procedures / Treatments   Labs (all labs ordered are listed, but only abnormal results are displayed) Labs Reviewed  URINALYSIS, ROUTINE W REFLEX MICROSCOPIC    EKG None  Radiology No results found.  Procedures Procedures    Medications Ordered in ED Medications  lidocaine (LIDODERM) 5 % 1 patch (1 patch Transdermal Not Given 12/08/22 1241)  ketorolac (TORADOL) 15 MG/ML injection 15 mg (15 mg Intravenous Given 12/08/22 1237)    ED Course/ Medical Decision Making/ A&P                           Medical Decision Making Amount and/or Complexity of Data Reviewed Labs: ordered.  Risk Prescription drug management.   Medical Decision Making / ED Course   This patient presents to the ED for concern of back pain, this involves an extensive number of treatment options, and is a complaint that carries with it a high risk of complications and morbidity.  The differential diagnosis includes cauda equina syndrome, spinal epidural abscess, sciatica, muscle strain  MDM: 42 year old female presents with neck pain.  Has chronic low back pain.  This has been ongoing since about 2000.  With occasional flareups.  Exam overall reassuring.  History and exam without concern for cauda equina syndrome, or spinal epidural abscess.  Likely sciatica from bulging disc given her history of chronic low back pain.  Toradol, lidocaine patch given in the emergency department.  Will discharge patient with naproxen, prednisone Dosepak, and lidocaine patch.  Patient is overall well-appearing.  Patient stable for discharge.  Discharged in stable condition.  Strict return precautions discussed that would raise suspicion for cauda equina syndrome, or spinal epidural abscess.   Discussed follow-up with PCP.  Lab Tests: -I ordered, reviewed, and interpreted labs.   The pertinent results include:   Labs Reviewed  URINALYSIS, ROUTINE W REFLEX MICROSCOPIC      EKG  EKG Interpretation  Date/Time:    Ventricular Rate:    PR Interval:    QRS Duration:   QT Interval:    QTC Calculation:   R Axis:     Text Interpretation:         Medicines ordered and prescription drug management: Meds ordered this encounter  Medications   ketorolac (TORADOL) 15 MG/ML injection 15 mg   lidocaine (LIDODERM) 5 % 1 patch    -I have reviewed the patients home medicines and have made adjustments as needed  Reevaluation: After the interventions noted above, I reevaluated the patient and found that they have :stayed the same  Co morbidities that complicate the patient evaluation  Past Medical History:  Diagnosis Date   Arthritis    Chronic knee pain    Chronic pelvic pain in female    Kidney stones    Obese    Ovarian cyst    Pneumonia due to COVID-19 virus 05/26/2019      Dispostion: Patient is appropriate for discharge.  Discharged in stable condition peer return precaution discussed.  Patient voices understanding and is in agreement with plan.  Final Clinical Impression(s) / ED Diagnoses Final diagnoses:  Bilateral low back pain with bilateral sciatica, unspecified chronicity    Rx / DC Orders ED Discharge Orders          Ordered    naproxen (NAPROSYN) 375 MG tablet  2 times daily        12/08/22 1313    predniSONE (STERAPRED UNI-PAK 21 TAB) 10 MG (21) TBPK tablet  Daily        12/08/22 1313    lidocaine (LIDODERM) 5 %  Every 24 hours        12/08/22 1313              Evlyn Courier, PA-C 12/08/22 1313    Gareth Morgan, MD 12/09/22 1424

## 2022-12-28 ENCOUNTER — Encounter (HOSPITAL_BASED_OUTPATIENT_CLINIC_OR_DEPARTMENT_OTHER): Payer: Self-pay

## 2022-12-28 ENCOUNTER — Encounter: Payer: Self-pay | Admitting: Gynecologic Oncology

## 2022-12-28 ENCOUNTER — Other Ambulatory Visit: Payer: Self-pay

## 2022-12-28 DIAGNOSIS — N939 Abnormal uterine and vaginal bleeding, unspecified: Secondary | ICD-10-CM | POA: Diagnosis not present

## 2022-12-28 DIAGNOSIS — R102 Pelvic and perineal pain: Secondary | ICD-10-CM | POA: Diagnosis not present

## 2022-12-28 LAB — CBC WITH DIFFERENTIAL/PLATELET
Abs Immature Granulocytes: 0.02 10*3/uL (ref 0.00–0.07)
Basophils Absolute: 0.1 10*3/uL (ref 0.0–0.1)
Basophils Relative: 0 %
Eosinophils Absolute: 0.1 10*3/uL (ref 0.0–0.5)
Eosinophils Relative: 1 %
HCT: 41.4 % (ref 36.0–46.0)
Hemoglobin: 13.5 g/dL (ref 12.0–15.0)
Immature Granulocytes: 0 %
Lymphocytes Relative: 35 %
Lymphs Abs: 3.9 10*3/uL (ref 0.7–4.0)
MCH: 26.7 pg (ref 26.0–34.0)
MCHC: 32.6 g/dL (ref 30.0–36.0)
MCV: 81.8 fL (ref 80.0–100.0)
Monocytes Absolute: 0.6 10*3/uL (ref 0.1–1.0)
Monocytes Relative: 6 %
Neutro Abs: 6.4 10*3/uL (ref 1.7–7.7)
Neutrophils Relative %: 58 %
Platelets: 298 10*3/uL (ref 150–400)
RBC: 5.06 MIL/uL (ref 3.87–5.11)
RDW: 15.2 % (ref 11.5–15.5)
WBC: 11.2 10*3/uL — ABNORMAL HIGH (ref 4.0–10.5)
nRBC: 0 % (ref 0.0–0.2)

## 2022-12-28 NOTE — ED Triage Notes (Signed)
Pt reporting lower abdominal pain and lower back pain. Pt reports not having menstrual cycles anymore after having ablation in 2014 but that she started having blood in urine 10 minutes ago, has not noticed any blood in underwear since. Hx of endometriosis. Denies N/V/fever.

## 2022-12-29 ENCOUNTER — Telehealth: Payer: Self-pay | Admitting: Surgery

## 2022-12-29 ENCOUNTER — Emergency Department (HOSPITAL_BASED_OUTPATIENT_CLINIC_OR_DEPARTMENT_OTHER)
Admission: EM | Admit: 2022-12-29 | Discharge: 2022-12-29 | Disposition: A | Discharge status: Home / Self Care | Payer: Commercial Managed Care - HMO | Attending: Emergency Medicine | Admitting: Emergency Medicine

## 2022-12-29 ENCOUNTER — Other Ambulatory Visit: Payer: Self-pay | Admitting: Gynecologic Oncology

## 2022-12-29 DIAGNOSIS — N939 Abnormal uterine and vaginal bleeding, unspecified: Secondary | ICD-10-CM

## 2022-12-29 DIAGNOSIS — N809 Endometriosis, unspecified: Secondary | ICD-10-CM

## 2022-12-29 DIAGNOSIS — R102 Pelvic and perineal pain: Secondary | ICD-10-CM | POA: Diagnosis not present

## 2022-12-29 LAB — PREGNANCY, URINE: Preg Test, Ur: NEGATIVE

## 2022-12-29 LAB — URINALYSIS, MICROSCOPIC (REFLEX): RBC / HPF: >50 RBC/hpf (ref 0–5)

## 2022-12-29 LAB — COMPREHENSIVE METABOLIC PANEL
ALT: 14 U/L (ref 0–44)
AST: 17 U/L (ref 15–41)
Albumin: 3.8 g/dL (ref 3.5–5.0)
Alkaline Phosphatase: 54 U/L (ref 38–126)
Anion gap: 8 (ref 5–15)
BUN: 13 mg/dL (ref 6–20)
CO2: 22 mmol/L (ref 22–32)
Calcium: 8.3 mg/dL — ABNORMAL LOW (ref 8.9–10.3)
Chloride: 106 mmol/L (ref 98–111)
Creatinine, Ser: 0.9 mg/dL (ref 0.44–1.00)
GFR, Estimated: >60 mL/min (ref >60)
Glucose, Bld: 95 mg/dL (ref 70–99)
Potassium: 3.5 mmol/L (ref 3.5–5.1)
Sodium: 136 mmol/L (ref 135–145)
Total Bilirubin: 0.9 mg/dL (ref 0.3–1.2)
Total Protein: 7.7 g/dL (ref 6.5–8.1)

## 2022-12-29 LAB — URINALYSIS, ROUTINE W REFLEX MICROSCOPIC
Bilirubin Urine: NEGATIVE
Glucose, UA: NEGATIVE mg/dL
Ketones, ur: NEGATIVE mg/dL
Nitrite: NEGATIVE
Protein, ur: 30 mg/dL — AB
Specific Gravity, Urine: >=1.03 (ref 1.005–1.030)
pH: 6 (ref 5.0–8.0)

## 2022-12-29 MED ORDER — KETOROLAC TROMETHAMINE 60 MG/2ML IM SOLN
30.0000 mg | Freq: Once | INTRAMUSCULAR | Status: AC
  Administered 2022-12-29: 30 mg via INTRAMUSCULAR
  Filled 2022-12-29: qty 2

## 2022-12-29 MED ORDER — NAPROXEN 375 MG PO TABS: 375.0000 mg | ORAL_TABLET | Freq: Two times a day (BID) | ORAL | 0 refills | Status: DC

## 2022-12-29 MED ORDER — LEUPROLIDE ACETATE (3 MONTH) 11.25 MG IM KIT: 11.2500 mg | PACK | Freq: Once | INTRAMUSCULAR | Status: DC

## 2022-12-29 NOTE — Telephone Encounter (Signed)
Spoke with patient and advised per Dr Berline Lopes that bleeding is likely due to missing multiple doses of medication. Dr Berline Lopes recommends Lupron as previously discussed during telephone visit. Patient states she will try to get additional samples of Myfembree from Dr Ronita Hipps but that she would be amenable to starting Lupron if insurance will cover it. Our office will call her with Lupron information. Patient verbalized understanding and also stated that she would like to revisit moving forward with surgery, despite the risks as previously discussed with Dr Berline Lopes. No other concerns at this time.

## 2022-12-29 NOTE — Telephone Encounter (Signed)
Patient called stating she went to the ER last night due to an increase in pain and bleeding. She normally has pain due to endometriosis but states that after taking her ibuprofen and Percocet she still had no pain relief. She also went to the bathroom and had bleeding as if she was starting her cycle. She states she has no had a cycle since her ablation in 2014. States she was given something for pain in the ER, which helped, and bleeding has since stopped. No bleeding that she has noticed this morning. Patient wanting to know if Kristin Boyd would want her to come in sooner.   Patient also states she has been taking Myfembree but has been getting samples from Kristin Boyd office and is out so she has no taken any in a few days. Patient advised that Kristin Boyd would be notified and our office will call her back with any recommendations.

## 2022-12-29 NOTE — ED Provider Notes (Signed)
[facility]  Provider Note  CSN: [id number] Arrival date & time: 12/28/22 2314  History Chief Complaint  Patient presents with   Abdominal Pain    Kristin Boyd is a 42 y.o. female with history of endometriosis and chronic pelvic pain with frequent ED visits for same reports several days of pelvic pain similar to previous, but today noticed blood in the toilet when she went to urinate which she thinks was vaginal. She had a uterine ablation in 2014 and has not had any bleeding since then. She denies any dysuria, fever, back pain, N/V/D.    Home Medications Prior to Admission medications   Medication Sig Start Date End Date Taking? Authorizing Provider  amLODipine (NORVASC) 10 MG tablet Take 1 tablet (10 mg total) by mouth daily. 10/28/22   Molpus, John, MD  ibuprofen (ADVIL) 800 MG tablet Take 1 tablet (800 mg total) by mouth every 8 (eight) hours as needed for moderate pain. 11/07/22   Long, Wonda Olds, MD  lidocaine (LIDODERM) 5 % Place 1 patch onto the skin daily. Remove & Discard patch within 12 hours or as directed by MD 12/08/22   Evlyn Courier, PA-C  naproxen (NAPROSYN) 375 MG tablet Take 1 tablet (375 mg total) by mouth 2 (two) times daily. 12/29/22   Truddie Hidden, MD  ondansetron (ZOFRAN-ODT) 4 MG disintegrating tablet Take 1 tablet (4 mg total) by mouth every 8 (eight) hours as needed for nausea or vomiting. 06/28/22   Rancour, Annie Main, MD  oxyCODONE-acetaminophen (PERCOCET/ROXICET) 5-325 MG tablet Take 1 tablet by mouth every 6 (six) hours as needed for severe pain. 11/07/22   Long, Wonda Olds, MD  predniSONE (STERAPRED UNI-PAK 21 TAB) 10 MG (21) TBPK tablet Take by mouth daily. Take 6 tabs by mouth daily  for 2 days, then 5 tabs for 2 days, then 4 tabs for 2 days, then 3 tabs for 2 days, 2 tabs for 2 days, then 1 tab by mouth daily for 2 days 12/08/22   Deatra Canter, Amjad, PA-C  senna-docusate (SENOKOT-S) 8.6-50 MG tablet Take 1 tablet by mouth at  bedtime as needed for mild constipation. 11/07/22   Long, Wonda Olds, MD     Allergies    Peanut (diagnostic) and Penicillins   Review of Systems   Review of Systems Please see HPI for pertinent positives and negatives  Physical Exam BP (!) 159/88   Pulse 73   Temp 98.2 F (36.8 C) (Oral)   Resp (!) 23   Ht '5\' 9"'$  (1.753 m)   Wt (!) 147.4 kg   SpO2 97%   BMI 47.99 kg/m   Physical Exam Vitals and nursing note reviewed.  Constitutional:      Appearance: Normal appearance.  HENT:     Head: Normocephalic and atraumatic.     Nose: Nose normal.     Mouth/Throat:     Mouth: Mucous membranes are moist.  Eyes:     Extraocular Movements: Extraocular movements intact.     Conjunctiva/sclera: Conjunctivae normal.  Cardiovascular:     Rate and Rhythm: Normal rate.  Pulmonary:     Effort: Pulmonary effort is normal.     Breath sounds: Normal breath sounds.  Abdominal:     General: Abdomen is flat.     Palpations: Abdomen is soft.     Tenderness: There is abdominal tenderness in the suprapubic area. There is no guarding. Negative signs include Murphy's sign and McBurney's sign.  Musculoskeletal:  General: No swelling. Normal range of motion.     Cervical back: Neck supple.  Skin:    General: Skin is warm and dry.  Neurological:     General: No focal deficit present.     Mental Status: She is alert.  Psychiatric:        Mood and Affect: Mood normal.     ED Results / Procedures / Treatments   EKG None  Procedures Procedures  Medications Ordered in the ED Medications  ketorolac (TORADOL) injection 30 mg (30 mg Intramuscular Given 12/29/22 0138)    Initial Impression and Plan  Patient here with exacerbation of chronic pelvic pain and possible vaginal bleeding. She attempted to provide a urine specimen but was unable to go. Blood work done in triage shows normal CBC and CMP. Toradol for pain, PO fluids to encourage urination.   ED Course   Clinical Course as of  12/29/22 0346  Mon Dec 29, 2022  0314 HCG is neg [CS]  0319 UA with blood but no signs of infection. Patient is resting comfortably in no distress. Recommend she follow up with her Gyn for further evaluation of her vaginal bleeding, but no concern for emergent cause tonight. Hemodynamically stable. RTED for any other concerns.  [CS]    Clinical Course User Index [CS] Truddie Hidden, MD     MDM Rules/Calculators/A&P Medical Decision Making Problems Addressed: Abnormal uterine bleeding (AUB): acute illness or injury Pelvic pain: chronic illness or injury with exacerbation, progression, or side effects of treatment  Amount and/or Complexity of Data Reviewed Labs: ordered. Decision-making details documented in ED Course.  Risk Prescription drug management.     Final Clinical Impression(s) / ED Diagnoses Final diagnoses:  Pelvic pain  Abnormal uterine bleeding (AUB)    Rx / DC Orders ED Discharge Orders          Ordered    naproxen (NAPROSYN) 375 MG tablet  2 times daily        12/29/22 0346             Truddie Hidden, MD 12/29/22 [phone]

## 2022-12-29 NOTE — Telephone Encounter (Signed)
The reason that she likely bled is because of missing multiple takes of her medication. If she is not going to be able to get this medication regularly, then I would recommend that she get Lupron as we discussed by phone.

## 2022-12-29 NOTE — ED Notes (Signed)
Pt ambulatory to bathroom, attempted to provide urine sample but reports inability to do so at this time.

## 2022-12-29 NOTE — Telephone Encounter (Signed)
Please move forward with seeing if Lupron is covered. I would like her to try Lupron before surgery.

## 2022-12-29 NOTE — Telephone Encounter (Signed)
Prior auth request sent for Lupron. Our office will continue to follow up on status.

## 2022-12-30 ENCOUNTER — Telehealth: Payer: Self-pay | Admitting: *Deleted

## 2022-12-30 NOTE — Telephone Encounter (Signed)
Call from [facility] with Dr Ronita Hipps.  Patient has called his office for samples and Jonelle Sidle letting us know that patient is no longer with their practice due to insurance change.  Reports that Dr Ronita Hipps spoke with Dr Berline Lopes regarding additional outside referral, possibly to [facility].  They will provide samples to patient that have been previously dispensed in December that patient has not yet picked up. They would not be able to assist with Lupron administration due to insurance.   Lupron authorization is in process here.

## 2023-01-01 NOTE — Telephone Encounter (Signed)
Call to patient to provide update and discuss Lupron option.(Lupron precert is in process) Left message to call back.

## 2023-01-09 ENCOUNTER — Encounter: Payer: Self-pay | Admitting: Gynecologic Oncology

## 2023-01-15 ENCOUNTER — Telehealth: Payer: Self-pay | Admitting: Surgery

## 2023-01-15 ENCOUNTER — Other Ambulatory Visit: Payer: Self-pay | Admitting: Gynecologic Oncology

## 2023-01-15 DIAGNOSIS — N809 Endometriosis, unspecified: Secondary | ICD-10-CM

## 2023-01-15 NOTE — Telephone Encounter (Signed)
Called patient to let her know ok to proceed with scheduling Lupron injection. Advised patient she will need to call her insurance company to find out her portion of the cost prior to her appointment. Patient states that she is planning to cancel her [employer] coverage and only use [employer]. However, she stated she would call both [employer] and [employer] to find out her cost for Lupron and then decide whether or not to keep [employer] if they would cover more of the Lupron. Patient stated she would call and let us know what she finds out.

## 2023-01-20 ENCOUNTER — Encounter (HOSPITAL_BASED_OUTPATIENT_CLINIC_OR_DEPARTMENT_OTHER): Payer: Self-pay | Admitting: *Deleted

## 2023-01-20 ENCOUNTER — Telehealth: Payer: Self-pay

## 2023-01-20 ENCOUNTER — Emergency Department (HOSPITAL_BASED_OUTPATIENT_CLINIC_OR_DEPARTMENT_OTHER): Payer: Medicaid Other

## 2023-01-20 ENCOUNTER — Other Ambulatory Visit: Payer: Self-pay

## 2023-01-20 DIAGNOSIS — N938 Other specified abnormal uterine and vaginal bleeding: Secondary | ICD-10-CM | POA: Diagnosis not present

## 2023-01-20 DIAGNOSIS — R102 Pelvic and perineal pain: Secondary | ICD-10-CM | POA: Diagnosis present

## 2023-01-20 DIAGNOSIS — N840 Polyp of corpus uteri: Secondary | ICD-10-CM | POA: Insufficient documentation

## 2023-01-20 DIAGNOSIS — Z91010 Allergy to peanuts: Secondary | ICD-10-CM | POA: Insufficient documentation

## 2023-01-20 DIAGNOSIS — N83202 Unspecified ovarian cyst, left side: Secondary | ICD-10-CM | POA: Diagnosis not present

## 2023-01-20 DIAGNOSIS — D259 Leiomyoma of uterus, unspecified: Secondary | ICD-10-CM | POA: Diagnosis not present

## 2023-01-20 LAB — COMPREHENSIVE METABOLIC PANEL
ALT: 17 U/L (ref 0–44)
AST: 20 U/L (ref 15–41)
Albumin: 4.3 g/dL (ref 3.5–5.0)
Alkaline Phosphatase: 65 U/L (ref 38–126)
Anion gap: 7 (ref 5–15)
BUN: 11 mg/dL (ref 6–20)
CO2: 24 mmol/L (ref 22–32)
Calcium: 8.5 mg/dL — ABNORMAL LOW (ref 8.9–10.3)
Chloride: 106 mmol/L (ref 98–111)
Creatinine, Ser: 0.75 mg/dL (ref 0.44–1.00)
GFR, Estimated: >60 mL/min (ref >60)
Glucose, Bld: 89 mg/dL (ref 70–99)
Potassium: 3.4 mmol/L — ABNORMAL LOW (ref 3.5–5.1)
Sodium: 137 mmol/L (ref 135–145)
Total Bilirubin: 1.3 mg/dL — ABNORMAL HIGH (ref 0.3–1.2)
Total Protein: 8.1 g/dL (ref 6.5–8.1)

## 2023-01-20 LAB — CBC WITH DIFFERENTIAL/PLATELET
Abs Immature Granulocytes: 0.03 10*3/uL (ref 0.00–0.07)
Basophils Absolute: 0 10*3/uL (ref 0.0–0.1)
Basophils Relative: 0 %
Eosinophils Absolute: 0.1 10*3/uL (ref 0.0–0.5)
Eosinophils Relative: 1 %
HCT: 42.9 % (ref 36.0–46.0)
Hemoglobin: 14 g/dL (ref 12.0–15.0)
Immature Granulocytes: 0 %
Lymphocytes Relative: 40 %
Lymphs Abs: 4.7 10*3/uL — ABNORMAL HIGH (ref 0.7–4.0)
MCH: 26.8 pg (ref 26.0–34.0)
MCHC: 32.6 g/dL (ref 30.0–36.0)
MCV: 82.2 fL (ref 80.0–100.0)
Monocytes Absolute: 0.6 10*3/uL (ref 0.1–1.0)
Monocytes Relative: 5 %
Neutro Abs: 6.2 10*3/uL (ref 1.7–7.7)
Neutrophils Relative %: 54 %
Platelets: 316 10*3/uL (ref 150–400)
RBC: 5.22 MIL/uL — ABNORMAL HIGH (ref 3.87–5.11)
RDW: 15 % (ref 11.5–15.5)
WBC: 11.6 10*3/uL — ABNORMAL HIGH (ref 4.0–10.5)
nRBC: 0 % (ref 0.0–0.2)

## 2023-01-20 LAB — URINALYSIS, ROUTINE W REFLEX MICROSCOPIC
Bilirubin Urine: NEGATIVE
Glucose, UA: NEGATIVE mg/dL
Ketones, ur: NEGATIVE mg/dL
Leukocytes,Ua: NEGATIVE
Nitrite: NEGATIVE
Protein, ur: NEGATIVE mg/dL
Specific Gravity, Urine: 1.02 (ref 1.005–1.030)
pH: 7.5 (ref 5.0–8.0)

## 2023-01-20 LAB — URINALYSIS, MICROSCOPIC (REFLEX): RBC / HPF: >50 RBC/hpf (ref 0–5)

## 2023-01-20 NOTE — ED Notes (Signed)
Comfort measures provided, pt instructed to remain NPO until further orders by ED Provider

## 2023-01-20 NOTE — Telephone Encounter (Signed)
Kristin Boyd called today stating she has called [employer] to follow up on the authorization for Lupron. She states it should be either tomorrow or Thursday before hearing back from them. If [name] will not cover it she will call Medicaid to check with them.  She is wanting to know if Dr.Tucker will send in something for pain, which is 8.5-9/10 on pain scale. The pain is in her lower back and radiates to her hips. No pelvic or abdominal pain. She states this pain is caused by her endometriosis and she no longer has a Gyn for this issue because she has been coming to see Dr.Tucker. Also, she wanted me to notify Dr.Tucker since she has stopped the MyFembre she will periodically have vaginal bleeding.  Pt is aware of Dr.Tucker being out of the office but I would reach out to her and give patient a call back either today or tomorrow with advise from Dr.Tucker. She voiced an understanding.

## 2023-01-20 NOTE — ED Triage Notes (Signed)
EMS picked up pt from [place], having abd pain and lower back pain, with vaginal bleeding. Has used 2 pads. Has had an ablation before due to bleeding. VSS. CBG 6m/dl

## 2023-01-21 ENCOUNTER — Emergency Department (HOSPITAL_BASED_OUTPATIENT_CLINIC_OR_DEPARTMENT_OTHER)
Admission: EM | Admit: 2023-01-21 | Discharge: 2023-01-21 | Disposition: A | Discharge status: Home / Self Care | Payer: Medicaid Other | Attending: Emergency Medicine | Admitting: Emergency Medicine

## 2023-01-21 DIAGNOSIS — N938 Other specified abnormal uterine and vaginal bleeding: Secondary | ICD-10-CM

## 2023-01-21 DIAGNOSIS — N83202 Unspecified ovarian cyst, left side: Secondary | ICD-10-CM

## 2023-01-21 DIAGNOSIS — N840 Polyp of corpus uteri: Secondary | ICD-10-CM

## 2023-01-21 DIAGNOSIS — R9389 Abnormal findings on diagnostic imaging of other specified body structures: Secondary | ICD-10-CM

## 2023-01-21 DIAGNOSIS — D259 Leiomyoma of uterus, unspecified: Secondary | ICD-10-CM

## 2023-01-21 MED ORDER — IBUPROFEN 800 MG PO TABS: 800.0000 mg | ORAL_TABLET | Freq: Three times a day (TID) | ORAL | 0 refills | Status: DC | PRN

## 2023-01-21 MED ORDER — DICLOFENAC SODIUM ER 100 MG PO TB24: 100.0000 mg | ORAL_TABLET | Freq: Every day | ORAL | 0 refills | Status: DC

## 2023-01-21 NOTE — Telephone Encounter (Signed)
LM for Ms Orrick to call back to the office to discuss setting up appointment for biopsy and to discuss prior authorization for the Lupron and pain management as noted below by Dr. Berline Lopes.

## 2023-01-21 NOTE — ED Provider Notes (Signed)
[facility] Provider Note   CSN: [id number] Arrival date & time: 01/20/23  1837     History  Chief Complaint  Patient presents with   Abdominal Pain    Kristin Boyd is a 42 y.o. female.  The history is provided by the patient.  Abdominal Pain Pain location: pelvic. Pain radiates to:  Does not radiate Pain severity:  Severe Onset quality:  Gradual Timing:  Constant Progression since onset: acute on chonic. Chronicity:  Chronic Context: not trauma   Context comment:  Vaginal bleeding has restarted Relieved by:  Nothing Worsened by:  Nothing Ineffective treatments:  None tried Associated symptoms: vaginal bleeding   Associated symptoms: no fever and no vomiting   Patient with endometriosis and ablation and a left cyst presents with worsening pain and vaginal bleeding.  No fevers, no vomiting or diarrhea.       Home Medications Prior to Admission medications   Medication Sig Start Date End Date Taking? Authorizing Provider  Diclofenac Sodium CR 100 MG 24 hr tablet Take 1 tablet (100 mg total) by mouth daily. 01/21/23  Yes Khyren Hing, MD  Diclofenac Sodium CR 100 MG 24 hr tablet Take 1 tablet (100 mg total) by mouth daily. 01/21/23  Yes Joscelynn Brutus, MD  amLODipine (NORVASC) 10 MG tablet Take 1 tablet (10 mg total) by mouth daily. 10/28/22   Molpus, John, MD  ibuprofen (ADVIL) 800 MG tablet Take 1 tablet (800 mg total) by mouth every 8 (eight) hours as needed for moderate pain. 11/07/22   Long, Wonda Olds, MD  lidocaine (LIDODERM) 5 % Place 1 patch onto the skin daily. Remove & Discard patch within 12 hours or as directed by MD 12/08/22   Evlyn Courier, PA-C  naproxen (NAPROSYN) 375 MG tablet Take 1 tablet (375 mg total) by mouth 2 (two) times daily. 12/29/22   Truddie Hidden, MD  ondansetron (ZOFRAN-ODT) 4 MG disintegrating tablet Take 1 tablet (4 mg total) by mouth every 8 (eight) hours as needed for nausea or vomiting.  06/28/22   Rancour, Annie Main, MD  oxyCODONE-acetaminophen (PERCOCET/ROXICET) 5-325 MG tablet Take 1 tablet by mouth every 6 (six) hours as needed for severe pain. 11/07/22   Long, Wonda Olds, MD  predniSONE (STERAPRED UNI-PAK 21 TAB) 10 MG (21) TBPK tablet Take by mouth daily. Take 6 tabs by mouth daily  for 2 days, then 5 tabs for 2 days, then 4 tabs for 2 days, then 3 tabs for 2 days, 2 tabs for 2 days, then 1 tab by mouth daily for 2 days 12/08/22   Deatra Canter, Amjad, PA-C  senna-docusate (SENOKOT-S) 8.6-50 MG tablet Take 1 tablet by mouth at bedtime as needed for mild constipation. 11/07/22   Long, Wonda Olds, MD      Allergies    Peanut (diagnostic) and Penicillins    Review of Systems   Review of Systems  Constitutional:  Negative for fever.  HENT:  Negative for facial swelling.   Eyes:  Negative for redness.  Respiratory:  Negative for wheezing and stridor.   Gastrointestinal:  Negative for vomiting.  Genitourinary:  Positive for pelvic pain and vaginal bleeding.  All other systems reviewed and are negative.   Physical Exam Updated Vital Signs BP (!) 160/84 (BP Location: Left Arm)   Pulse 69   Temp 98.4 F (36.9 C) (Oral)   Resp 18   SpO2 100%  Physical Exam Vitals and nursing note reviewed.  Constitutional:  General: She is not in acute distress.    Appearance: She is well-developed.  HENT:     Head: Normocephalic and atraumatic.     Nose: Nose normal.  Eyes:     Pupils: Pupils are equal, round, and reactive to light.  Cardiovascular:     Rate and Rhythm: Normal rate and regular rhythm.     Pulses: Normal pulses.     Heart sounds: Normal heart sounds.  Pulmonary:     Effort: Pulmonary effort is normal. No respiratory distress.     Breath sounds: Normal breath sounds.  Abdominal:     General: Bowel sounds are normal. There is no distension.     Palpations: Abdomen is soft.     Tenderness: There is no abdominal tenderness. There is no guarding or rebound.  Genitourinary:     Vagina: No vaginal discharge.  Musculoskeletal:        General: Normal range of motion.     Cervical back: Neck supple.  Skin:    General: Skin is dry.     Capillary Refill: Capillary refill takes less than 2 seconds.     Findings: No erythema or rash.  Neurological:     General: No focal deficit present.     Mental Status: She is alert and oriented to person, place, and time.     Deep Tendon Reflexes: Reflexes normal.  Psychiatric:        Mood and Affect: Mood normal.        Behavior: Behavior normal.     ED Results / Procedures / Treatments   Labs (all labs ordered are listed, but only abnormal results are displayed) Results for orders placed or performed during the hospital encounter of 01/21/23  CBC with Differential  Result Value Ref Range   WBC 11.6 (H) 4.0 - 10.5 K/uL   RBC 5.22 (H) 3.87 - 5.11 MIL/uL   Hemoglobin 14.0 12.0 - 15.0 g/dL   HCT 42.9 36.0 - 46.0 %   MCV 82.2 80.0 - 100.0 fL   MCH 26.8 26.0 - 34.0 pg   MCHC 32.6 30.0 - 36.0 g/dL   RDW 15.0 11.5 - 15.5 %   Platelets 316 150 - 400 K/uL   nRBC 0.0 0.0 - 0.2 %   Neutrophils Relative % 54 %   Neutro Abs 6.2 1.7 - 7.7 K/uL   Lymphocytes Relative 40 %   Lymphs Abs 4.7 (H) 0.7 - 4.0 K/uL   Monocytes Relative 5 %   Monocytes Absolute 0.6 0.1 - 1.0 K/uL   Eosinophils Relative 1 %   Eosinophils Absolute 0.1 0.0 - 0.5 K/uL   Basophils Relative 0 %   Basophils Absolute 0.0 0.0 - 0.1 K/uL   Immature Granulocytes 0 %   Abs Immature Granulocytes 0.03 0.00 - 0.07 K/uL  Comprehensive metabolic panel  Result Value Ref Range   Sodium 137 135 - 145 mmol/L   Potassium 3.4 (L) 3.5 - 5.1 mmol/L   Chloride 106 98 - 111 mmol/L   CO2 24 22 - 32 mmol/L   Glucose, Bld 89 70 - 99 mg/dL   BUN 11 6 - 20 mg/dL   Creatinine, Ser 0.75 0.44 - 1.00 mg/dL   Calcium 8.5 (L) 8.9 - 10.3 mg/dL   Total Protein 8.1 6.5 - 8.1 g/dL   Albumin 4.3 3.5 - 5.0 g/dL   AST 20 15 - 41 U/L   ALT 17 0 - 44 U/L   Alkaline Phosphatase 65 38 -  126  U/L   Total Bilirubin 1.3 (H) 0.3 - 1.2 mg/dL   GFR, Estimated >60 >60 mL/min   Anion gap 7 5 - 15  Urinalysis, Routine w reflex microscopic -Urine, Clean Catch  Result Value Ref Range   Color, Urine YELLOW YELLOW   APPearance CLEAR CLEAR   Specific Gravity, Urine 1.020 1.005 - 1.030   pH 7.5 5.0 - 8.0   Glucose, UA NEGATIVE NEGATIVE mg/dL   Hgb urine dipstick LARGE (A) NEGATIVE   Bilirubin Urine NEGATIVE NEGATIVE   Ketones, ur NEGATIVE NEGATIVE mg/dL   Protein, ur NEGATIVE NEGATIVE mg/dL   Nitrite NEGATIVE NEGATIVE   Leukocytes,Ua NEGATIVE NEGATIVE  Urinalysis, Microscopic (reflex)  Result Value Ref Range   RBC / HPF >50 0 - 5 RBC/hpf   WBC, UA 0-5 0 - 5 WBC/hpf   Bacteria, UA RARE (A) NONE SEEN   Squamous Epithelial / HPF 0-5 0 - 5 /HPF   US PELVIC COMPLETE W TRANSVAGINAL AND TORSION R/O  Result Date: 01/21/2023 CLINICAL DATA:  Heavy vaginal bleeding for 2 weeks. Diffuse pelvic pain. Left ovarian cyst and history of endometriosis. EXAM: TRANSABDOMINAL AND TRANSVAGINAL ULTRASOUND OF PELVIS DOPPLER ULTRASOUND OF OVARIES TECHNIQUE: Both transabdominal and transvaginal ultrasound examinations of the pelvis were performed. Transabdominal technique was performed for global imaging of the pelvis including uterus, ovaries, adnexal regions, and pelvic cul-de-sac. It was necessary to proceed with endovaginal exam following the transabdominal exam to visualize the uterus, endometrium, and bilateral adnexa. Color and duplex Doppler ultrasound was utilized to evaluate blood flow to the ovaries. COMPARISON:  None Available. FINDINGS: Uterus Measurements: 11.6 x 6.7 x 8.1 cm = volume: 629.5 mL. There is a fibroid at the uterine fundus on the right measuring 4.6 x 2.9 x 3.9 cm Endometrium Thickness: 18.8 mm. Hyperechoic structure is noted in the lower uterine segment/cervix measuring 12.7 x 5.6 x 7.5 mm. No internal vascularity is seen. Right ovary Not seen. Left ovary Measurements: 8.2 x 8.6 x 9.4  cm = volume: 662.9 mL. There is a cyst with diffuse low-level internal echoes structure measuring 7.7 x 6.9 x 8.6 cm. Pulsed Doppler evaluation of the left ovary demonstrates normal low-resistance arterial and venous waveforms. Other findings No abnormal free fluid. Limited evaluation due to patient's body habitus. IMPRESSION: 1. No evidence of ovarian torsion on the left. The right ovary is not visualized on exam. 2. Complex hypoechoic structure in the left ovary measuring 7.7 x 6.9 x 8.6 cm, possible hemorrhagic cyst or endometrioma and not significantly changed from the prior exam. OBGYN consultation is recommended. 3. Uterine fibroid. 4. Hyperechoic lesion in the lower uterine segment/cervix, possible polyp. 5. Mildly thickened endometrium. Appropriate follow-up is recommended. Electronically Signed   By: Brett Fairy M.D.   On: 01/21/2023 00:33     Radiology US PELVIC COMPLETE W TRANSVAGINAL AND TORSION R/O  Result Date: 01/21/2023 CLINICAL DATA:  Heavy vaginal bleeding for 2 weeks. Diffuse pelvic pain. Left ovarian cyst and history of endometriosis. EXAM: TRANSABDOMINAL AND TRANSVAGINAL ULTRASOUND OF PELVIS DOPPLER ULTRASOUND OF OVARIES TECHNIQUE: Both transabdominal and transvaginal ultrasound examinations of the pelvis were performed. Transabdominal technique was performed for global imaging of the pelvis including uterus, ovaries, adnexal regions, and pelvic cul-de-sac. It was necessary to proceed with endovaginal exam following the transabdominal exam to visualize the uterus, endometrium, and bilateral adnexa. Color and duplex Doppler ultrasound was utilized to evaluate blood flow to the ovaries. COMPARISON:  None Available. FINDINGS: Uterus Measurements: 11.6 x 6.7 x 8.1 cm = volume:  629.5 mL. There is a fibroid at the uterine fundus on the right measuring 4.6 x 2.9 x 3.9 cm Endometrium Thickness: 18.8 mm. Hyperechoic structure is noted in the lower uterine segment/cervix measuring 12.7 x 5.6 x 7.5  mm. No internal vascularity is seen. Right ovary Not seen. Left ovary Measurements: 8.2 x 8.6 x 9.4 cm = volume: 662.9 mL. There is a cyst with diffuse low-level internal echoes structure measuring 7.7 x 6.9 x 8.6 cm. Pulsed Doppler evaluation of the left ovary demonstrates normal low-resistance arterial and venous waveforms. Other findings No abnormal free fluid. Limited evaluation due to patient's body habitus. IMPRESSION: 1. No evidence of ovarian torsion on the left. The right ovary is not visualized on exam. 2. Complex hypoechoic structure in the left ovary measuring 7.7 x 6.9 x 8.6 cm, possible hemorrhagic cyst or endometrioma and not significantly changed from the prior exam. OBGYN consultation is recommended. 3. Uterine fibroid. 4. Hyperechoic lesion in the lower uterine segment/cervix, possible polyp. 5. Mildly thickened endometrium. Appropriate follow-up is recommended. Electronically Signed   By: Brett Fairy M.D.   On: 01/21/2023 00:33    Procedures Procedures    Medications Ordered in ED Medications - No data to display  ED Course/ Medical Decision Making/ A&P                             Medical Decision Making Patient with ongoing pelvic pain and cyst and endometriosis as well as recurrent vaginal bleeding  Amount and/or Complexity of Data Reviewed External Data Reviewed: notes.    Details: Previous notes  Labs: ordered.    Details: All labs reviewed: urine without UTI.  Normal sodium 137, potassium slight low 3.4, normal creatinine .75, normal AST and ALT. White count slight elevation white count 11.6, normal hemoglobin 14, normal platelets  Radiology: ordered and independent interpretation performed.    Details: Fibroid by me on [place]  Risk Prescription drug management. Risk Details: Very well appearing with normal hemoglobin. Findings on [place] appear to be chronic.  They can contribute to bleeding.  I have advised close follow up with GYN for ongoing testing and treatment.   Stable for discharge.  Strict return.      Final Clinical Impression(s) / ED Diagnoses Final diagnoses:  Dysfunctional uterine bleeding  Cyst of left ovary  Thickened endometrium  Uterine leiomyoma, unspecified location  Uterine polyp   Return for intractable cough, coughing up blood, fevers > 100.4 unrelieved by medication, shortness of breath, intractable vomiting, chest pain, shortness of breath, weakness, numbness, changes in speech, facial asymmetry, abdominal pain, passing out, Inability to tolerate liquids or food, cough, altered mental status or any concerns. No signs of systemic illness or infection. The patient is nontoxic-appearing on exam and vital signs are within normal limits.  I have reviewed the triage vital signs and the nursing notes. Pertinent labs & imaging results that were available during my care of the patient were reviewed by me and considered in my medical decision making (see chart for details). After history, exam, and medical workup I feel the patient has been appropriately medically screened and is safe for discharge home. Pertinent diagnoses were discussed with the patient. Patient was given return precautions.     Kenton Fortin, MD 01/21/23 [phone]

## 2023-01-21 NOTE — Telephone Encounter (Signed)
Please stress the importance of getting her started on Lupron to help with her symptoms and pain. She should continue to use tylenol, ibuprofen and heat for pain.  Given her abnormal uterine bleeding, we will need to get her in for an endometrial biopsy. Ideally, we would try to do this within the next 2 weeks if possible. If there is only one surgery on 2/29, we can bring her in that day.

## 2023-01-22 ENCOUNTER — Encounter: Payer: Self-pay | Admitting: Gynecologic Oncology

## 2023-02-04 ENCOUNTER — Inpatient Hospital Stay: Payer: Commercial Managed Care - HMO | Attending: Gynecologic Oncology | Admitting: Gynecologic Oncology

## 2023-02-04 ENCOUNTER — Encounter: Payer: Self-pay | Admitting: Gynecologic Oncology

## 2023-02-04 ENCOUNTER — Ambulatory Visit (INDEPENDENT_AMBULATORY_CARE_PROVIDER_SITE_OTHER): Payer: Commercial Managed Care - HMO | Admitting: Obstetrics and Gynecology

## 2023-02-04 ENCOUNTER — Encounter: Payer: Self-pay | Admitting: Obstetrics and Gynecology

## 2023-02-04 VITALS — BP 130/84 | HR 82 | Ht 69.0 in | Wt 324.0 lb

## 2023-02-04 DIAGNOSIS — Z7189 Other specified counseling: Secondary | ICD-10-CM | POA: Diagnosis not present

## 2023-02-04 DIAGNOSIS — N809 Endometriosis, unspecified: Secondary | ICD-10-CM | POA: Diagnosis not present

## 2023-02-04 DIAGNOSIS — D259 Leiomyoma of uterus, unspecified: Secondary | ICD-10-CM | POA: Diagnosis not present

## 2023-02-04 NOTE — Progress Notes (Signed)
42 y.o New GYN presents to establish care. C/o heavy bleeding since 01/06/2023, bleeding stopped 2 weeks ago, pain 8-10/10.  Last PAP 10/11/2021 NILM

## 2023-02-04 NOTE — Progress Notes (Signed)
Gynecologic Oncology Telehealth Note: Gyn-Onc  I connected with [name] on 02/04/23 at  4:00 PM EST by telephone and verified that I am speaking with the correct person using two identifiers.  I discussed the limitations, risks, security and privacy concerns of performing an evaluation and management service by telemedicine and the availability of in-person appointments. I also discussed with the patient that there may be a patient responsible charge related to this service. The patient expressed understanding and agreed to proceed.  Other persons participating in the visit and their role in the encounter: none.  Patient's location: home Provider's location: [facility]  Reason for Visit: follow-up endometriosis  Treatment History: Patient's history is notable for an endometrial ablation at the age of 58.  She describes her periods as being quite heavy at this time and occurring sometimes multiple times a month.  She then had intermittent pain after the ablation but then in 2019 started to have progressive pain.  She ultimately underwent imaging with CT scan and pelvic ultrasound in August 2020 which showed an 8.3 x 6.6 x 8.9 cm complex cystic and solid left adnexal mass.  Follow-up ultrasound showed a complex and cystic lesion in the left adnexa with a tubular shape measuring 12.6 x 5.6 x 7.4 cm; mass on imaging was felt to perhaps represent a tubo-ovarian abscess.  CA125 was obtained at that time and was mildly elevated at 51.  The plan at that time was for referral to GYN oncology, which did not appear to have happened.     She then had multiple visits in the emergency department with persistence of the mass noted.  Most recently, she had a consultation with Dr. Gwenlyn Found at [facility] with a plan to proceed with surgical excision.  Most recent imaging by ultrasound in mid October showed a left adnexal mass measuring 10.3 x 11.4 x 11.2 cm with associated septation and vascularity  although poorly visualized.  Patient was seen in our emergency department on the fifth of this month.  She underwent CT and pelvic ultrasound which show persistent mass without significant change in size since most recent ultrasound.     11/12/2021: Diagnostic surgery for a complex adnexal mass and thickened endometrium.  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   She was on Orilissa for some time with good relief but unfortunately this stopped being covered by her insurance.  She then tried norethindrone (didn't provide much relief).   See in the ED on 2/13 with pain.  Pelvic ultrasound at that time showed complex hypoechoic structure in the left ovary measuring 7.7 x 6.9 x 8.6, possible hemorrhagic cyst or endometrioma.  Uterine fibroid also noted.  Mildly thickened endometrium.  Interval History: Seen by Dr. Rip Harbour today.  Her authorization for Lupron has been started.  Notes overall doing better from a pain standpoint using Myfembree.  Past Medical/Surgical History: Past Medical History:  Diagnosis Date   Arthritis    Chronic knee pain    Chronic pelvic pain in female    Hypertension    Kidney stones    Obese    Ovarian cyst    Pneumonia due to COVID-19 virus 05/26/2019   Postoperative state 11/12/2021   Vaginal Pap smear, abnormal     Past Surgical History:  Procedure Laterality Date   CESAREAN SECTION     CHOLECYSTECTOMY     DILATION AND CURETTAGE OF UTERUS N/A 11/12/2021  Procedure: ATTEMPTED HYSTEROSCOPY;  Surgeon: Lafonda Mosses, MD;  Location: [facility];  Service: Gynecology;  Laterality: N/A;   ENDOMETRIAL ABLATION     WISDOM TOOTH EXTRACTION     2 removed   XI ROBOTIC ASSISTED SALPINGECTOMY N/A 11/12/2021   Procedure: DIAGNOSTIC LAPAROSCOPY, PERITONEAL BIOPSIES;  Surgeon: Lafonda Mosses, MD;  Location: [facility];  Service: Gynecology;  Laterality:  N/A;    Family History  Problem Relation Age of Onset   Hypertension Father    Diabetes Father    Colon cancer Neg Hx    Breast cancer Neg Hx    Ovarian cancer Neg Hx    Endometrial cancer Neg Hx    Pancreatic cancer Neg Hx    Prostate cancer Neg Hx     Social History   Socioeconomic History   Marital status: Single    Spouse name: Not on file   Number of children: 2   Years of education: Not on file   Highest education level: Not on file  Occupational History   Not on file  Tobacco Use   Smoking status: Every Day    Types: Cigarettes   Smokeless tobacco: Never  Vaping Use   Vaping Use: Never used  Substance and Sexual Activity   Alcohol use: Yes    Comment: occasional   Drug use: Never   Sexual activity: Not Currently    Birth control/protection: Surgical  Other Topics Concern   Not on file  Social History Narrative   Not on file   Social Determinants of Health   Financial Resource Strain: Not on file  Food Insecurity: Not on file  Transportation Needs: Not on file  Physical Activity: Not on file  Stress: Not on file  Social Connections: Not on file    Current Medications:  Current Outpatient Medications:    amLODipine (NORVASC) 10 MG tablet, Take 1 tablet (10 mg total) by mouth daily. (Patient not taking: Reported on 02/04/2023), Disp: 30 tablet, Rfl: 0   ibuprofen (ADVIL) 800 MG tablet, Take 1 tablet (800 mg total) by mouth every 8 (eight) hours as needed for moderate pain., Disp: 21 tablet, Rfl: 0   lidocaine (LIDODERM) 5 %, Place 1 patch onto the skin daily. Remove & Discard patch within 12 hours or as directed by MD, Disp: 30 patch, Rfl: 0   oxyCODONE-acetaminophen (PERCOCET/ROXICET) 5-325 MG tablet, Take 1 tablet by mouth every 6 (six) hours as needed for severe pain., Disp: 10 tablet, Rfl: 0  Review of Symptoms: Pertinent positives as per HPI.  Physical Exam: Deferred given limitations of phone visit.  Laboratory & Radiologic Studies: None  new  Assessment & Plan: Kristin Boyd is a 42 y.o. woman with endometriosis.  Patient is doing better on ovarian suppression.  Unfortunately, this has not been a reliable prescription for her.  I have been working on getting her added on Lupron.  She had an appointment today to establish with an OB/GYN with the hope of getting Lupron approved through her insurance.  Given significant surgical morbidity, I have discussed with her trial of Lupron prior to reconsideration of surgical excision.  Asked her to call me when she gets the dose of Lupron.  I will plan to have a follow-up phone visit with her 4-6 weeks afterwards.  At that phone visit, we will discuss possible add back therapy.  I discussed the assessment and treatment plan with the patient. The patient was provided with an opportunity to ask questions and all were answered.  The patient agreed with the plan and demonstrated an understanding of the instructions.   The patient was advised to call back or see an in-person evaluation if the symptoms worsen or if the condition fails to improve as anticipated.   8 minutes of total time was spent for this patient encounter, including preparation, phone counseling with the patient and coordination of care, and documentation of the encounter.   Jeral Pinch, MD  Division of Gynecologic Oncology  Department of Obstetrics and Gynecology  [facility]

## 2023-02-04 NOTE — Progress Notes (Signed)
Kristin Boyd presents at the request of Dr Raliegh Ip. Berline Lopes  ( GYN Onc) for assistance in obtaining Depo Lupron for her endometriosis. Please see medical records for additional information  PE  AF VSS Lungs clear Heart RRR Abd soft + BS obese  A/P Endometriosis and uterine fibroids  Paperwork for Depo Lupron completed today. Pt advised that outpt pharmacy that prescribes medication will be in contact with her. They will then submit for insurance approval. Pt to continue care with Dr. Dorian Pod as well. F/U with me in 3 months

## 2023-02-10 ENCOUNTER — Encounter: Payer: Self-pay | Admitting: Gynecologic Oncology

## 2023-02-11 ENCOUNTER — Telehealth: Payer: Self-pay

## 2023-02-11 ENCOUNTER — Other Ambulatory Visit (HOSPITAL_COMMUNITY): Payer: Self-pay

## 2023-02-11 NOTE — Telephone Encounter (Signed)
ENCOUNTER OPENED IN ERROR

## 2023-02-12 ENCOUNTER — Telehealth: Payer: Self-pay

## 2023-02-12 NOTE — Telephone Encounter (Signed)
Per Joylene John NP, in regards to Ms. Simmon's Mychart message.   Can someone please reach out to Dr. Marjory Lies office at [facility] to see if her lupron has been approved yet? You can let his staff know she is having symptoms.      I reached out to [facility] and spoke to [place], (answering service) she will pass along the message to Dr.Ervin's nurse to call me back regarding status of Lupron [place].

## 2023-02-13 ENCOUNTER — Telehealth: Payer: Self-pay

## 2023-02-13 ENCOUNTER — Encounter: Payer: Self-pay | Admitting: Gynecologic Oncology

## 2023-02-13 NOTE — Telephone Encounter (Signed)
Kim CMA for Dr. Berline Lopes with [facility] stating that she needed some additional information regarding patients last appt with Dr. Rip Harbour.  Called Kim back and left a message.  Her contact number is [phone]

## 2023-02-13 NOTE — Telephone Encounter (Signed)
LVM on nurse line, for return call, at the [facility] location where the patient was seen on 2/28.

## 2023-02-13 NOTE — Telephone Encounter (Signed)
Per Donalda Ewings, from [facility], status of Lupron is still pending

## 2023-02-23 ENCOUNTER — Telehealth: Payer: Self-pay

## 2023-02-23 NOTE — Telephone Encounter (Signed)
I LVM on nurse line at [facility] in [place] regarding the status of Kristin Boyd. Once pt receives injection she needs a follow up phone visit 4-6 weeks afterwards with Dr.Tucker.

## 2023-02-24 ENCOUNTER — Telehealth: Payer: Self-pay | Admitting: Emergency Medicine

## 2023-02-24 NOTE — Telephone Encounter (Signed)
Attempted return call to Maudie Mercury at Dr. Berline Lopes GYN ONC x2. Lupron is here in office, pt scheduled injection on 3/21.

## 2023-02-24 NOTE — Telephone Encounter (Signed)
Left another voicemail on nurse line requesting a call back to update [place] on the status of pt getting her Lupron injection

## 2023-02-25 ENCOUNTER — Encounter: Payer: Self-pay | Admitting: Gynecologic Oncology

## 2023-02-25 NOTE — Telephone Encounter (Signed)
Per Delana Meyer at [facility], pt has an appointment for first Lupron injection tomorrow 3/21.   Per Dr.Tucker pt is scheduled for a phone visit for a follow up in 6 weeks on 5/2.  Dr. Berline Lopes notified

## 2023-02-26 ENCOUNTER — Ambulatory Visit (INDEPENDENT_AMBULATORY_CARE_PROVIDER_SITE_OTHER): Payer: Commercial Managed Care - HMO

## 2023-02-26 VITALS — BP 154/90 | HR 60 | Ht 69.0 in | Wt 324.4 lb

## 2023-02-26 DIAGNOSIS — Z5181 Encounter for therapeutic drug level monitoring: Secondary | ICD-10-CM

## 2023-02-26 DIAGNOSIS — N809 Endometriosis, unspecified: Secondary | ICD-10-CM

## 2023-02-26 DIAGNOSIS — Z79818 Long term (current) use of other agents affecting estrogen receptors and estrogen levels: Secondary | ICD-10-CM | POA: Diagnosis not present

## 2023-02-26 LAB — POCT URINE PREGNANCY: Preg Test, Ur: NEGATIVE

## 2023-02-26 MED ORDER — LEUPROLIDE ACETATE (3 MONTH) 11.25 MG IM KIT
11.2500 mg | PACK | Freq: Once | INTRAMUSCULAR | Status: AC
  Administered 2023-02-26: 11.25 mg via INTRAMUSCULAR

## 2023-02-26 NOTE — Progress Notes (Signed)
Patient presents for Lupron injection. BP high in office today. Pt encouraged to follow-up with PCP regarding high BP. Lupron given in [place]. Pt tolerated well. Pt to return in 3 months for next injection.

## 2023-03-25 ENCOUNTER — Encounter: Payer: Self-pay | Admitting: Gynecologic Oncology

## 2023-03-30 ENCOUNTER — Encounter: Payer: Commercial Managed Care - HMO | Admitting: Obstetrics and Gynecology

## 2023-04-09 ENCOUNTER — Encounter: Payer: Self-pay | Admitting: Gynecologic Oncology

## 2023-04-09 ENCOUNTER — Inpatient Hospital Stay: Payer: Medicaid Other | Attending: Gynecologic Oncology | Admitting: Gynecologic Oncology

## 2023-04-09 DIAGNOSIS — N809 Endometriosis, unspecified: Secondary | ICD-10-CM

## 2023-04-09 NOTE — Progress Notes (Unsigned)
Attempted to call patient and scheduled phone visit. Tried her number on multiple different lines - got message that patient can not accept calls at this time. Mychart message sent.  Eugene Garnet MD Gynecologic Oncology  This encounter was created in error - please disregard.

## 2023-04-18 ENCOUNTER — Other Ambulatory Visit: Payer: Self-pay

## 2023-04-18 ENCOUNTER — Emergency Department (HOSPITAL_BASED_OUTPATIENT_CLINIC_OR_DEPARTMENT_OTHER): Payer: Medicaid Other

## 2023-04-18 ENCOUNTER — Emergency Department (HOSPITAL_BASED_OUTPATIENT_CLINIC_OR_DEPARTMENT_OTHER)
Admission: EM | Admit: 2023-04-18 | Discharge: 2023-04-18 | Disposition: A | Discharge status: Home / Self Care | Payer: Medicaid Other | Attending: Emergency Medicine | Admitting: Emergency Medicine

## 2023-04-18 ENCOUNTER — Encounter (HOSPITAL_BASED_OUTPATIENT_CLINIC_OR_DEPARTMENT_OTHER): Payer: Self-pay

## 2023-04-18 DIAGNOSIS — M79672 Pain in left foot: Secondary | ICD-10-CM | POA: Diagnosis not present

## 2023-04-18 DIAGNOSIS — I10 Essential (primary) hypertension: Secondary | ICD-10-CM | POA: Diagnosis not present

## 2023-04-18 MED ORDER — MELOXICAM 15 MG PO TABS: 15.0000 mg | ORAL_TABLET | Freq: Every day | ORAL | 0 refills | Status: AC

## 2023-04-18 MED ORDER — DICLOFENAC SODIUM 1 % EX GEL: 2.0000 g | Freq: Four times a day (QID) | CUTANEOUS | 0 refills | Status: DC | PRN

## 2023-04-18 MED ORDER — KETOROLAC TROMETHAMINE 30 MG/ML IJ SOLN
30.0000 mg | Freq: Once | INTRAMUSCULAR | Status: AC
  Administered 2023-04-18: 30 mg via INTRAMUSCULAR
  Filled 2023-04-18: qty 1

## 2023-04-18 NOTE — ED Provider Notes (Signed)
Emergency Department Provider Note   I have reviewed the triage vital signs and the nursing notes.   HISTORY  Chief Complaint Foot Pain   HPI Kristin Boyd is a 42 y.o. female past history reviewed below presents emergency department with left foot pain and swelling.  Symptoms have been worsening over the past 2 to 3 days.  She does have a job where she is on her feet throughout the day.  Denies any injuries or falls.  No numbness or weakness.  No pain into the calf or leg swelling.  Most of her pain is in the top of the left foot and radiates to the toes. Denies rash.    Past Medical History:  Diagnosis Date   Arthritis    Chronic knee pain    Chronic pelvic pain in female    Hypertension    Kidney stones    Obese    Ovarian cyst    Pneumonia due to COVID-19 virus 05/26/2019   Postoperative state 11/12/2021   Vaginal Pap smear, abnormal     Review of Systems  Constitutional: No fever/chills Cardiovascular: Denies chest pain. Respiratory: Denies shortness of breath. Gastrointestinal: No abdominal pain.  Musculoskeletal: Positive left foot pain.  Skin: Negative for rash. Neurological: Negative for numbness/weakness.   ____________________________________________   PHYSICAL EXAM:  VITAL SIGNS: ED Triage Vitals  Enc Vitals Group     BP 04/18/23 0028 (!) 180/111     Pulse Rate 04/18/23 0028 98     Resp 04/18/23 0028 16     Temp 04/18/23 0028 98 F (36.7 C)     Temp Source 04/18/23 0028 Oral     SpO2 04/18/23 0028 100 %     Weight 04/18/23 0031 (!) 325 lb (147.4 kg)     Height 04/18/23 0031 5\' 9"  (1.753 m)   Constitutional: Alert and oriented. Well appearing and in no acute distress. Eyes: Conjunctivae are normal Head: Atraumatic. Nose: No congestion/rhinnorhea. Mouth/Throat: Mucous membranes are moist. Neck: No stridor.   Cardiovascular: Good peripheral circulation. 2+ DP and PT pulses on the left.  Respiratory: Normal respiratory effort.    Gastrointestinal: No distention.  Musculoskeletal: Tenderness to the dorsal aspect of the left foot without crepitus or deformity.  No lateral or medial malleolus tenderness.  No tenderness over the Achilles tendon.  Normal range of motion of the left ankle.  No tenderness to the calf or appreciable leg swelling. Neurologic:  Normal speech and language. No gross focal neurologic deficits are appreciated.  Skin:  Skin is warm, dry and intact. No rash noted.  ____________________________________________  RADIOLOGY  DG Foot Complete Left  Result Date: 04/18/2023 CLINICAL DATA:  Left foot pain for several days, no known injury, initial encounter EXAM: LEFT FOOT - COMPLETE 3+ VIEW COMPARISON:  None Available. FINDINGS: Calcaneal spurring is noted. Tarsal degenerative changes are seen as well. No acute fracture or dislocation is noted. No soft tissue changes are noted. IMPRESSION: Degenerative change without acute abnormality. Electronically Signed   By: Alcide Clever M.D.   On: 04/18/2023 00:52    ____________________________________________   PROCEDURES  Procedure(s) performed:   Procedures  None  ____________________________________________   INITIAL IMPRESSION / ASSESSMENT AND PLAN / ED COURSE  Pertinent labs & imaging results that were available during my care of the patient were reviewed by me and considered in my medical decision making (see chart for details).   This patient is Presenting for Evaluation of foot pain, which does require a range of  treatment options, and is a complaint that involves a moderate risk of morbidity and mortality.  The Differential Diagnoses include fracture, sprain, arthritis, DVT, etc.  Critical Interventions-    Medications  ketorolac (TORADOL) 30 MG/ML injection 30 mg (has no administration in time range)    Reassessment after intervention: pain improved.   Radiologic Tests Ordered, included foot XR. I independently interpreted the images  and agree with radiology interpretation.   Medical Decision Making: Summary:  Patient presents emergency department left foot pain and mild swelling.  Symptoms are isolated to the dorsal aspect of the left foot.  No cellulitis.  No pain in the calf or appreciable leg swelling to strongly suspect DVT.  X-ray shows degenerative changes but no acute fracture. Plan for RICE, Meloxicam, and podiatry follow up. Contact information provided.   Patient's presentation is most consistent with acute, uncomplicated illness.   Disposition: discharge  ____________________________________________  FINAL CLINICAL IMPRESSION(S) / ED DIAGNOSES  Final diagnoses:  Foot pain, left     NEW OUTPATIENT MEDICATIONS STARTED DURING THIS VISIT:  New Prescriptions   DICLOFENAC SODIUM (VOLTAREN) 1 % GEL    Apply 2 g topically 4 (four) times daily as needed.   MELOXICAM (MOBIC) 15 MG TABLET    Take 1 tablet (15 mg total) by mouth daily for 7 days.    Note:  This document was prepared using Dragon voice recognition software and may include unintentional dictation errors.  Alona Bene, MD, [facility]    Lititia Sen, Arlyss Repress, MD 04/18/23 [phone]

## 2023-04-18 NOTE — ED Notes (Signed)
Discharge instructions reviewed with patient. Patient questions answered and opportunity for education reviewed. Patient voices understanding of discharge instructions with no further questions. Patient ambulatory with steady gait to lobby.  

## 2023-04-18 NOTE — ED Triage Notes (Signed)
Complaining of left foot pain that started 2-3 days ago.  York Spaniel it hurts on the top of her foot down toward her toes. Denies any injury to the foot.

## 2023-04-18 NOTE — Discharge Instructions (Signed)
Your x-ray does not show any broken bones.  I would like for you to follow with the podiatrist for further evaluation.  I have called in meloxicam for your pain.  This is similar to ibuprofen/Aleve and you should not take additional of these medicines while taking meloxicam.  You may take Tylenol for additional pain relief or use the gel provided as needed.

## 2023-04-24 ENCOUNTER — Encounter: Payer: Self-pay | Admitting: Gynecologic Oncology

## 2023-04-24 ENCOUNTER — Telehealth: Payer: Self-pay | Admitting: *Deleted

## 2023-04-24 NOTE — Telephone Encounter (Signed)
Attempted to reach out to Kristin Boyd in regards to message left from Dr. Pricilla Holm. Pt's only number listed [phone] - states, "your call can not be completed as dialed"

## 2023-04-27 ENCOUNTER — Encounter: Payer: Self-pay | Admitting: Gynecologic Oncology

## 2023-05-05 ENCOUNTER — Ambulatory Visit (INDEPENDENT_AMBULATORY_CARE_PROVIDER_SITE_OTHER): Payer: Medicaid Other

## 2023-05-05 ENCOUNTER — Encounter: Payer: Self-pay | Admitting: Podiatry

## 2023-05-05 ENCOUNTER — Ambulatory Visit (INDEPENDENT_AMBULATORY_CARE_PROVIDER_SITE_OTHER): Payer: Medicaid Other | Admitting: Podiatry

## 2023-05-05 DIAGNOSIS — M19072 Primary osteoarthritis, left ankle and foot: Secondary | ICD-10-CM

## 2023-05-05 DIAGNOSIS — M778 Other enthesopathies, not elsewhere classified: Secondary | ICD-10-CM

## 2023-05-05 DIAGNOSIS — M7752 Other enthesopathy of left foot: Secondary | ICD-10-CM

## 2023-05-05 MED ORDER — METHYLPREDNISOLONE 4 MG PO TBPK: ORAL_TABLET | ORAL | 0 refills | Status: DC

## 2023-05-05 MED ORDER — MELOXICAM 15 MG PO TABS: 15.0000 mg | ORAL_TABLET | Freq: Every day | ORAL | 0 refills | Status: DC

## 2023-05-05 NOTE — Progress Notes (Signed)
  Subjective:  Patient ID: Kristin Boyd, female    DOB: Nov 22, 1981,   MRN: [id number]  Chief Complaint  Patient presents with   Foot Pain    Left foot pain , patient denies any injury     42 y.o. female presents for concern of left foot pain that started about 3 weeks ago. Denies any injury. Relates swelling and pain on the top of her foot. She was seen in ED and no fractures found. She works at [employer] on her feet all day. Relates she has been using topical icy hot and voltaren gel with minimal relief. Wears crocs at work. Relates today it is doing better.  . Denies any other pedal complaints. Denies n/v/f/c.   Past Medical History:  Diagnosis Date   Arthritis    Chronic knee pain    Chronic pelvic pain in female    Hypertension    Kidney stones    Obese    Ovarian cyst    Pneumonia due to COVID-19 virus 05/26/2019   Postoperative state 11/12/2021   Vaginal Pap smear, abnormal     Objective:  Physical Exam: Vascular: DP/PT pulses 2/4 bilateral. CFT <3 seconds. Normal hair growth on digits. No edema.  Skin. No lacerations or abrasions bilateral feet.  Musculoskeletal: MMT 5/5 bilateral lower extremities in DF, PF, Inversion and Eversion. Deceased ROM in DF of ankle joint. Mildly tender to dorsum of foot around TMTJ on left. No pain with Rom of the foot today. Mild edema noted.  Neurological: Sensation intact to light touch.   Assessment:   1. Capsulitis of left foot   2. Arthritis of left midfoot      Plan:  Patient was evaluated and treated and all questions answered. -Xrays reviewed. No acute fractures or dislocations noted. Some degenerative changes note throughout the midfoot.  -Discussed treatement options for capsulitis vs arthritis vs gouty arthritis and gout education provided. Reviewed notes from ED. -Patient deferred injection -Discussed diet and modifications.  -Rx medrol dose pack and meloxicam provided.  -Ordered arthritic lab panel; will call patient  with results if abnormal -Advised patient to call if symptoms are not improved within 1 week -Patient to return in 4 weeks for re-check/further discussion for long term management of gout or sooner if condition worsens.   Louann Sjogren, DPM

## 2023-05-13 ENCOUNTER — Encounter: Payer: Self-pay | Admitting: Gynecologic Oncology

## 2023-05-20 ENCOUNTER — Encounter: Payer: Self-pay | Admitting: Obstetrics and Gynecology

## 2023-06-03 ENCOUNTER — Telehealth: Payer: Self-pay | Admitting: *Deleted

## 2023-06-03 NOTE — Telephone Encounter (Signed)
TC to confirm that pt had RTC to [employer] and approved shipment of Lupron Depot injection to the office. Pt confirms that call was made over the weekend. Pt advised that the office would call her once medication has arrived.

## 2023-06-04 ENCOUNTER — Telehealth: Payer: Self-pay | Admitting: *Deleted

## 2023-06-04 NOTE — Telephone Encounter (Signed)
Called on behalf of pt regarding shipment of Depo Lupron from [facility]. Representative states they have been unable to reach the pt to confirm shipment of the med to our office. The number they had on file differed from the number in her chart. I gave them the number from her chart. I also messaged the pt via [facility] and advised her to answer when they call. Patient direct number to [facility] [phone]) also given.

## 2023-06-08 ENCOUNTER — Ambulatory Visit (INDEPENDENT_AMBULATORY_CARE_PROVIDER_SITE_OTHER): Payer: Medicaid Other | Admitting: Podiatry

## 2023-06-08 ENCOUNTER — Encounter: Payer: Self-pay | Admitting: Podiatry

## 2023-06-08 DIAGNOSIS — M7752 Other enthesopathy of left foot: Secondary | ICD-10-CM

## 2023-06-08 DIAGNOSIS — M19072 Primary osteoarthritis, left ankle and foot: Secondary | ICD-10-CM | POA: Diagnosis not present

## 2023-06-08 DIAGNOSIS — M778 Other enthesopathies, not elsewhere classified: Secondary | ICD-10-CM

## 2023-06-08 MED ORDER — DEXAMETHASONE SODIUM PHOSPHATE 120 MG/30ML IJ SOLN
4.0000 mg | Freq: Once | INTRAMUSCULAR | Status: AC
  Administered 2023-06-08: 4 mg via INTRA_ARTICULAR

## 2023-06-08 MED ORDER — TRIAMCINOLONE ACETONIDE 10 MG/ML IJ SUSP
10.0000 mg | Freq: Once | INTRAMUSCULAR | Status: AC
  Administered 2023-06-08: 10 mg

## 2023-06-08 NOTE — Progress Notes (Signed)
  Subjective:  Patient ID: Kristin Boyd, female    DOB: 1981-06-20,   MRN: [id number]  Chief Complaint  Patient presents with   Foot Pain    Left foot pain  no improvement since the last visit     42 y.o. female presents for follow-up of left foot pain.  Relates she has continued to have pain relates the steroid pack may have helped but relates she did not take it properly and spaced out the doses over days. Relates not sure if the melxoicam helps She works at [employer] on her feet all day.  . Denies any other pedal complaints. Denies n/v/f/c.   Past Medical History:  Diagnosis Date   Arthritis    Chronic knee pain    Chronic pelvic pain in female    Hypertension    Kidney stones    Obese    Ovarian cyst    Pneumonia due to COVID-19 virus 05/26/2019   Postoperative state 11/12/2021   Vaginal Pap smear, abnormal     Objective:  Physical Exam: Vascular: DP/PT pulses 2/4 bilateral. CFT <3 seconds. Normal hair growth on digits. No edema.  Skin. No lacerations or abrasions bilateral feet.  Musculoskeletal: MMT 5/5 bilateral lower extremities in DF, PF, Inversion and Eversion. Deceased ROM in DF of ankle joint. Mildly tender to dorsum of foot around TMTJ on left. No pain with Rom of the foot today. Mild edema noted.  Neurological: Sensation intact to light touch.   Assessment:   1. Capsulitis of left foot   2. Arthritis of left midfoot       Plan:  Patient was evaluated and treated and all questions answered. -Xrays reviewed. No acute fractures or dislocations noted. Some degenerative changes note throughout the midfoot.  -Discussed treatement options for capsulitis vs arthritis vs gouty arthritis and gout education provided. Reviewed notes from ED. -Injection offered today. Procedure below.  -Discussed diet and modifications.  -[place]ie with meloxicam.  -Ordered arthritic lab panel; will call patient with results if abnormal -Advised patient to call if symptoms are not  improved within 1 week -Patient to return in 6 weeks for re-check/further discussion for long term management of gout or sooner if condition worsens.   Procedure: Injection Tendon/Ligament Discussed alternatives, risks, complications and verbal consent was obtained.  Location: Left second tarsometatarsal joint. Skin Prep: Alcohol. Injectate: 1cc 0.5% marcaine plain, 1 cc dexamethasone 0.5 cc kenalog  Disposition: Patient tolerated procedure well. Injection site dressed with a band-aid.  Post-injection care was discussed and return precautions discussed.    Louann Sjogren, DPM

## 2023-06-10 LAB — ARTHRITIS PANEL
Anti Nuclear Antibody (ANA): NEGATIVE
Rheumatoid fact SerPl-aCnc: 31 IU/mL — ABNORMAL HIGH (ref <14.0)
Sed Rate: 26 mm/hr (ref 0–32)
Uric Acid: 4.5 mg/dL (ref 2.6–6.2)

## 2023-06-11 ENCOUNTER — Other Ambulatory Visit: Payer: Self-pay | Admitting: Podiatry

## 2023-06-11 DIAGNOSIS — M19072 Primary osteoarthritis, left ankle and foot: Secondary | ICD-10-CM

## 2023-06-11 DIAGNOSIS — M778 Other enthesopathies, not elsewhere classified: Secondary | ICD-10-CM

## 2023-06-15 ENCOUNTER — Other Ambulatory Visit: Payer: Self-pay | Admitting: Podiatry

## 2023-06-16 ENCOUNTER — Encounter: Payer: Self-pay | Admitting: Emergency Medicine

## 2023-06-18 ENCOUNTER — Encounter: Payer: Self-pay | Admitting: Gynecologic Oncology

## 2023-06-19 ENCOUNTER — Ambulatory Visit (INDEPENDENT_AMBULATORY_CARE_PROVIDER_SITE_OTHER): Payer: 59

## 2023-06-19 ENCOUNTER — Telehealth: Payer: Self-pay | Admitting: *Deleted

## 2023-06-19 VITALS — BP 170/93 | HR 87 | Ht 69.0 in | Wt 343.7 lb

## 2023-06-19 DIAGNOSIS — D259 Leiomyoma of uterus, unspecified: Secondary | ICD-10-CM

## 2023-06-19 DIAGNOSIS — Z3202 Encounter for pregnancy test, result negative: Secondary | ICD-10-CM | POA: Diagnosis not present

## 2023-06-19 DIAGNOSIS — Z79818 Long term (current) use of other agents affecting estrogen receptors and estrogen levels: Secondary | ICD-10-CM

## 2023-06-19 LAB — POCT URINE PREGNANCY: Preg Test, Ur: NEGATIVE

## 2023-06-19 MED ORDER — LEUPROLIDE ACETATE 3.75 MG IM KIT: 3.7500 mg | PACK | Freq: Once | INTRAMUSCULAR | Status: DC

## 2023-06-19 MED ORDER — LEUPROLIDE ACETATE (3 MONTH) 11.25 MG IM KIT
11.2500 mg | PACK | Freq: Once | INTRAMUSCULAR | Status: AC
  Administered 2023-06-19: 11.25 mg via INTRAMUSCULAR

## 2023-06-19 NOTE — Progress Notes (Signed)
Patient presents for Lupron injection. Blood pressure high in office today. Pt encouraged to establish care with a PCP regarding blood pressures. Patient also advised to report to the hospital if she starts to experience vision changes or uncontrolled headaches.   Lupron given in LUOQ. Pt tolerated well. Pt to follow up with Dr. Alysia Penna for refills.

## 2023-06-19 NOTE — Telephone Encounter (Signed)
Spoke with Kristin Boyd who called the office to schedule a phone visit with Dr. Pricilla Holm to follow up in regards to her lupron injections.   Patient currently denies any symptoms and received her second lupron injection today. Patient was given a phone visit appointment for Friday, July 26 at 4:15 pm. Patient agreed to date and time and had no further concerns or questions at this time.

## 2023-07-03 ENCOUNTER — Inpatient Hospital Stay: Payer: Medicaid Other | Attending: Gynecologic Oncology | Admitting: Gynecologic Oncology

## 2023-07-03 ENCOUNTER — Telehealth: Payer: Self-pay | Admitting: Gynecologic Oncology

## 2023-07-03 ENCOUNTER — Encounter: Payer: Self-pay | Admitting: Gynecologic Oncology

## 2023-07-03 DIAGNOSIS — N809 Endometriosis, unspecified: Secondary | ICD-10-CM | POA: Diagnosis not present

## 2023-07-03 DIAGNOSIS — I10 Essential (primary) hypertension: Secondary | ICD-10-CM

## 2023-07-03 DIAGNOSIS — M545 Low back pain, unspecified: Secondary | ICD-10-CM | POA: Diagnosis not present

## 2023-07-03 DIAGNOSIS — N9489 Other specified conditions associated with female genital organs and menstrual cycle: Secondary | ICD-10-CM

## 2023-07-03 MED ORDER — AMLODIPINE BESYLATE 10 MG PO TABS: 10.0000 mg | ORAL_TABLET | Freq: Every day | ORAL | 0 refills | Status: DC

## 2023-07-03 NOTE — Progress Notes (Signed)
Gynecologic Oncology Telehealth Note: Gyn-Onc  I connected with Kristin Boyd on 07/03/23 at  4:15 PM EDT by telephone and verified that I am speaking with the correct person using two identifiers.  I discussed the limitations, risks, security and privacy concerns of performing an evaluation and management service by telemedicine and the availability of in-person appointments. I also discussed with the patient that there may be a patient responsible charge related to this service. The patient expressed understanding and agreed to proceed.  Other persons participating in the visit and their role in the encounter: none.  Patient's location: home Provider's location: [facility]  Reason for Visit: follow-up  Treatment History: Patient's history is notable for an endometrial ablation at the age of 35.  She describes her periods as being quite heavy at this time and occurring sometimes multiple times a month.  She then had intermittent pain after the ablation but then in 2019 started to have progressive pain.  She ultimately underwent imaging with CT scan and pelvic ultrasound in August 2020 which showed an 8.3 x 6.6 x 8.9 cm complex cystic and solid left adnexal mass.  Follow-up ultrasound showed a complex and cystic lesion in the left adnexa with a tubular shape measuring 12.6 x 5.6 x 7.4 cm; mass on imaging was felt to perhaps represent a tubo-ovarian abscess.  CA125 was obtained at that time and was mildly elevated at 51.  The plan at that time was for referral to GYN oncology, which did not appear to have happened.     She then had multiple visits in the emergency department with persistence of the mass noted.  Most recently, she had a consultation with Dr. Allyson Sabal at [facility] with a plan to proceed with surgical excision.  Most recent imaging by ultrasound in mid October showed a left adnexal mass measuring 10.3 x 11.4 x 11.2 cm with associated septation and vascularity although poorly  visualized.  Patient was seen in our emergency department on the fifth of this month.  She underwent CT and pelvic ultrasound which show persistent mass without significant change in size since most recent ultrasound.     11/12/2021: Diagnostic surgery for a complex adnexal mass and thickened endometrium.  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   She was on Orilissa for some time with good relief but unfortunately this stopped being covered by her insurance.  She then tried norethindrone (didn't provide much relief).    See in the ED on 2/13 with pain.  Pelvic ultrasound at that time showed complex hypoechoic structure in the left ovary measuring 7.7 x 6.9 x 8.6, possible hemorrhagic cyst or endometrioma.  Uterine fibroid also noted.  Mildly thickened endometrium.  Started Lupron 02/26/23  Interval History: Back pain started 2-3 days after second injection.  Describes this as low back pain along the midline.  Bothers a little, not like it used to. Tylenol not helping much. Using heat treatment somewhat. Reports normal bowel and bladder function. Going for walks on daily.  Denies vaginal bleeding.   Past Medical/Surgical History: Past Medical History:  Diagnosis Date   Arthritis    Chronic knee pain    Chronic pelvic pain in female    Hypertension    Kidney stones    Obese    Ovarian cyst    Pneumonia due to COVID-19 virus 05/26/2019   Postoperative state 11/12/2021   Vaginal Pap smear, abnormal  Past Surgical History:  Procedure Laterality Date   CESAREAN SECTION     CHOLECYSTECTOMY     DILATION AND CURETTAGE OF UTERUS N/A 11/12/2021   Procedure: ATTEMPTED HYSTEROSCOPY;  Surgeon: Carver Fila, MD;  Location: [facility];  Service: Gynecology;  Laterality: N/A;   ENDOMETRIAL ABLATION     WISDOM TOOTH EXTRACTION     2 removed   XI ROBOTIC ASSISTED  SALPINGECTOMY N/A 11/12/2021   Procedure: DIAGNOSTIC LAPAROSCOPY, PERITONEAL BIOPSIES;  Surgeon: Carver Fila, MD;  Location: [facility];  Service: Gynecology;  Laterality: N/A;    Family History  Problem Relation Age of Onset   Hypertension Father    Diabetes Father    Colon cancer Neg Hx    Breast cancer Neg Hx    Ovarian cancer Neg Hx    Endometrial cancer Neg Hx    Pancreatic cancer Neg Hx    Prostate cancer Neg Hx     Social History   Socioeconomic History   Marital status: Single    Spouse name: Not on file   Number of children: 2   Years of education: Not on file   Highest education level: Not on file  Occupational History   Not on file  Tobacco Use   Smoking status: Every Day    Types: Cigarettes   Smokeless tobacco: Never  Vaping Use   Vaping status: Never Used  Substance and Sexual Activity   Alcohol use: Yes    Comment: occasional   Drug use: Never   Sexual activity: Not Currently    Birth control/protection: Surgical  Other Topics Concern   Not on file  Social History Narrative   Not on file   Social Determinants of Health   Financial Resource Strain: Not on file  Food Insecurity: Not on file  Transportation Needs: Not on file  Physical Activity: Not on file  Stress: Not on file  Social Connections: Unknown (04/22/2022)   [facility]   Social Network    Social Network: Not on file    Current Medications:  Current Outpatient Medications:    amLODipine (NORVASC) 10 MG tablet, Take 1 tablet (10 mg total) by mouth daily. (Patient not taking: Reported on 02/04/2023), Disp: 30 tablet, Rfl: 0   diclofenac Sodium (VOLTAREN) 1 % GEL, Apply 2 g topically 4 (four) times daily as needed. (Patient not taking: Reported on 06/19/2023), Disp: 100 g, Rfl: 0   ibuprofen (ADVIL) 800 MG tablet, Take 1 tablet (800 mg total) by mouth every 8 (eight) hours as needed for moderate pain., Disp: 21 tablet, Rfl: 0   lidocaine (LIDODERM) 5 %, Place 1 patch  onto the skin daily. Remove & Discard patch within 12 hours or as directed by MD, Disp: 30 patch, Rfl: 0   meloxicam (MOBIC) 15 MG tablet, TAKE 1 TABLET(15 MG) BY MOUTH DAILY, Disp: 30 tablet, Rfl: 0   methylPREDNISolone (MEDROL DOSEPAK) 4 MG TBPK tablet, Take as directed (Patient not taking: Reported on 06/19/2023), Disp: 21 tablet, Rfl: 0   oxyCODONE-acetaminophen (PERCOCET/ROXICET) 5-325 MG tablet, Take 1 tablet by mouth every 6 (six) hours as needed for severe pain. (Patient not taking: Reported on 06/19/2023), Disp: 10 tablet, Rfl: 0  Review of Symptoms: Pertinent positives as per HPI.  Physical Exam: Deferred given limitations of phone visit.  Laboratory & Radiologic Studies: None new  Assessment & Plan: Kristin Boyd is a 42 y.o. woman with symptomatic endometriosis now on suppression with Lupron.  Patient is overall doing well  from an endometriosis standpoint.  She developed some low back pain a couple of days after her last Lupron injection.  I suspect that this may not be related to her endometriosis.  As of her last visit, she had gained 15 pounds.  She is not having any abdominal symptoms including pain and is not having any bleeding.  I discussed with her referral to physical therapy as well as plan to get a pelvic ultrasound to reassess the size of her endometrioma within the next 1 to 2 months.  Blood pressure was quite elevated at her last visit with OBGYN.  She denies any symptoms but is not currently taking antihypertensive.  She had previously been on 10 mg of Norvasc.  I sent in a refill for this today.  She is working to establish with a primary care provider but currently does not have 1.  I will ask Clydie Braun in my office to see if she can help identify a clinic where the patient could establish care.  I will call the patient with her ultrasound results.  Given well she is doing on the Lupron, I think it would be reasonable to start a little add back therapy.  I discussed  the assessment and treatment plan with the patient. The patient was provided with an opportunity to ask questions and all were answered. The patient agreed with the plan and demonstrated an understanding of the instructions.   The patient was advised to call back or see an in-person evaluation if the symptoms worsen or if the condition fails to improve as anticipated.   8 minutes of total time was spent for this patient encounter, including preparation, phone counseling with the patient and coordination of care, and documentation of the encounter.   Eugene Garnet, MD  Division of Gynecologic Oncology  Department of Obstetrics and Gynecology  [facility]

## 2023-07-03 NOTE — Telephone Encounter (Signed)
Called patient at her scheduled phone visit time today.  No answer.  Voicemail left requesting callback.  Eugene Garnet MD Gynecologic Oncology

## 2023-07-06 ENCOUNTER — Telehealth: Payer: Self-pay | Admitting: Oncology

## 2023-07-06 ENCOUNTER — Encounter: Payer: Self-pay | Admitting: Oncology

## 2023-07-06 DIAGNOSIS — N809 Endometriosis, unspecified: Secondary | ICD-10-CM

## 2023-07-06 NOTE — Telephone Encounter (Signed)
Left a message with appointment to see Dr. Randa Evens at [facility] tomorrow at 1:50.  Requested a return call to go over the details.

## 2023-07-07 ENCOUNTER — Encounter (INDEPENDENT_AMBULATORY_CARE_PROVIDER_SITE_OTHER): Payer: Self-pay

## 2023-07-07 ENCOUNTER — Ambulatory Visit (INDEPENDENT_AMBULATORY_CARE_PROVIDER_SITE_OTHER): Payer: Medicaid Other | Admitting: Primary Care

## 2023-07-07 NOTE — Telephone Encounter (Signed)
Left another message regarding appointment today with Dr. Randa Evens.  Requested a return call to confirm.

## 2023-07-08 ENCOUNTER — Telehealth: Payer: Self-pay

## 2023-07-08 NOTE — Telephone Encounter (Signed)
Called and left voicemail for patient to call back. I need to inform her of her [place] appointment which is on 8/26 @ 1:30pm.  She needs to arrive at 1:15pm with a full bladder.

## 2023-07-20 ENCOUNTER — Ambulatory Visit: Payer: Medicaid Other | Admitting: Physician Assistant

## 2023-07-27 ENCOUNTER — Ambulatory Visit (INDEPENDENT_AMBULATORY_CARE_PROVIDER_SITE_OTHER): Payer: Medicaid Other | Admitting: Podiatry

## 2023-07-27 ENCOUNTER — Encounter: Payer: Self-pay | Admitting: Podiatry

## 2023-07-27 DIAGNOSIS — M19072 Primary osteoarthritis, left ankle and foot: Secondary | ICD-10-CM | POA: Diagnosis not present

## 2023-07-27 DIAGNOSIS — M778 Other enthesopathies, not elsewhere classified: Secondary | ICD-10-CM | POA: Diagnosis not present

## 2023-07-27 MED ORDER — METHYLPREDNISOLONE 4 MG PO TBPK: ORAL_TABLET | ORAL | 0 refills | Status: DC

## 2023-07-27 NOTE — Progress Notes (Signed)
  Subjective:  Patient ID: Kristin Boyd, female    DOB: 08-26-1981,   MRN: [id number]  Chief Complaint  Patient presents with   Foot Pain    Painful left foot, the pain is more on the top of the foot and also the ankle.    42 y.o. female presents for follow-up of left foot pain. Relates the injection last time did help for a while but the past few days the pain has returned. She relates getting more pain after being on her feet at [facility].   . Denies any other pedal complaints. Denies n/v/f/c.   Past Medical History:  Diagnosis Date   Arthritis    Chronic knee pain    Chronic pelvic pain in female    Hypertension    Kidney stones    Obese    Ovarian cyst    Pneumonia due to COVID-19 virus 05/26/2019   Postoperative state 11/12/2021   Vaginal Pap smear, abnormal     Objective:  Physical Exam: Vascular: DP/PT pulses 2/4 bilateral. CFT <3 seconds. Normal hair growth on digits. No edema.  Skin. No lacerations or abrasions bilateral feet.  Musculoskeletal: MMT 5/5 bilateral lower extremities in DF, PF, Inversion and Eversion. Deceased ROM in DF of ankle joint. Mildly tender to dorsum of foot around TMTJ on left. No pain with Rom of the foot today. Mild edema noted. Pain around the right ankle and midfoot as well as some pain to the forefoot areas mostly on the right. Relates some pain in knees as well.  Neurological: Sensation intact to light touch.   Assessment:   1. Arthritis of left midfoot   2. Capsulitis of left foot        Plan:  Patient was evaluated and treated and all questions answered. -Xrays reviewed. No acute fractures or dislocations noted. Some degenerative changes note throughout the midfoot.  -Discussed treatement options for capsulitis vs arthritis vs  and education provided. Reviewed notes from ED. -Medrol dose pack provided today given multiple areas of pain.  -Discussed diet and modifications.  -Cotninuie with meloxicam.  -Arthritis panel reviewed  and RF elevated. Rheumatology appointment in [place] for further evaluation.  -Discussed supportive shoes and inserts and will consider CMO.  -Patient to return as needed   Louann Sjogren, DPM

## 2023-07-28 NOTE — Therapy (Signed)
OUTPATIENT PHYSICAL THERAPY THORACOLUMBAR EVALUATION   Patient Name: Kristin Boyd MRN: [id number] DOB:December 27, 1980, 42 y.o., female Today's Date: 07/29/2023  END OF SESSION:  PT End of Session - 07/29/23 1213     Visit Number 1    Date for PT Re-Evaluation 09/09/23    Authorization Type [employer]    PT Start Time [phone]    PT Stop Time 1015    PT Time Calculation (min) 38 min             Past Medical History:  Diagnosis Date   Arthritis    Chronic knee pain    Chronic pelvic pain in female    Hypertension    Kidney stones    Obese    Ovarian cyst    Pneumonia due to COVID-19 virus 05/26/2019   Postoperative state 11/12/2021   Vaginal Pap smear, abnormal    Past Surgical History:  Procedure Laterality Date   CESAREAN SECTION     CHOLECYSTECTOMY     DILATION AND CURETTAGE OF UTERUS N/A 11/12/2021   Procedure: ATTEMPTED HYSTEROSCOPY;  Surgeon: Carver Fila, MD;  Location: [facility];  Service: Gynecology;  Laterality: N/A;   ENDOMETRIAL ABLATION     WISDOM TOOTH EXTRACTION     2 removed   XI ROBOTIC ASSISTED SALPINGECTOMY N/A 11/12/2021   Procedure: DIAGNOSTIC LAPAROSCOPY, PERITONEAL BIOPSIES;  Surgeon: Carver Fila, MD;  Location: [facility];  Service: Gynecology;  Laterality: N/A;   Patient Active Problem List   Diagnosis Date Noted   Screening mammogram for breast cancer 07/29/2023   Encounter for monitoring Lupron therapy 07/29/2023   Fibroid, uterine 02/04/2023   Endometriosis 11/13/2021   Adnexal mass 10/23/2021   Morbid obesity with BMI of 50.0-59.9, adult (HCC) 06/06/2019    PCP: Pcp, No   REFERRING PROVIDER: Carver Fila, MD  REFERRING DIAG: M54.50 (ICD-10-CM) - Acute midline low back pain without sciatica  Rationale for Evaluation and Treatment: Rehabilitation  THERAPY DIAG:  Other low back pain  Cramp and spasm  Muscle weakness (generalized)  ONSET DATE:  chronic, recent exacerbation started 1 week ago.    SUBJECTIVE:                                                                                                                                                                                           SUBJECTIVE STATEMENT: Patient reported she had come to hospital for severe low back pain and that's when she found out she had severe endemetriosis and a cyst on her ovary.  Since she's been on lupron shots, she hasn't had the stomach pain, but still getting the low  back pain.  Tries to tough it out at work, but it gets severe and has to sit down.   She reports she has had back on and off since 2000, but this current episode started a few days ago.    PERTINENT HISTORY:  Severe endometriosis, uterine fibroids, C-section, arthritis, obesity  PAIN:  Are you having pain? Yes: NPRS scale: 4-9/10 Pain location: across lower back Pain description: feels like she got punched in back, occasionally shoots down legs but not common Aggravating factors: prolonged standing on hard surfaces at work Relieving factors: icy hot patches  PRECAUTIONS: None  RED FLAGS: None   WEIGHT BEARING RESTRICTIONS: No  FALLS:  Has patient fallen in last 6 months? No  LIVING ENVIRONMENT: Lives with: lives with their family Lives in: House/apartment Stairs: No Has following equipment at home: None  OCCUPATION: work at [employer] as Conservation officer, nature  PLOF: Independent  PATIENT GOALS: decrease pain  NEXT MD VISIT: 08/03/23  OBJECTIVE:   DIAGNOSTIC FINDINGS:  No imaging available of lumbar spine.   PATIENT SURVEYS:  Modified Oswestry 11/50 = 22%   COGNITION: Overall cognitive status: Within functional limits for tasks assessed     SENSATION: WFL  MUSCLE LENGTH: Hamstrings: mild tightness bil  Quadriceps: moderate tightness bil   POSTURE: No Significant postural limitations  PALPATION: Tenderness throughout bil QL, glutes, piriformis, SIJ, lumbar paraspinals, tenderness L2-5 with PA mobs.    LUMBAR ROM:    AROM eval  Flexion Almost to ankles, WNL  Extension WNL, relieves pain  Right lateral flexion WNL, discomfort R SIJ  Left lateral flexion WNL  Right rotation WNL  Left rotation WNL   (Blank rows = not tested)  LOWER EXTREMITY ROM:   decreased hip IR bil  LOWER EXTREMITY MMT:    MMT Right eval Left eval  Hip flexion 5 5  Hip extension 5 5  Hip abduction 5 5  Hip adduction 5 5  Knee flexion    Knee extension 5 5  Ankle dorsiflexion 5 5  Ankle plantarflexion     (Blank rows = not tested)  LUMBAR SPECIAL TESTS:  Straight leg raise test: Negative, FABER test: Positive, and Long sit test: Positive  FUNCTIONAL TESTS:    GAIT: Distance walked: 10' Assistive device utilized: None Level of assistance: Complete Independence Comments: visually slow, feet externally rotated   TODAY'S TREATMENT:                                                                                                                              DATE:     PATIENT EDUCATION:  Education details: findings, POC Person educated: Patient Education method: Explanation Education comprehension: verbalized understanding  HOME EXERCISE PROGRAM: TBD  ASSESSMENT:  CLINICAL IMPRESSION: Kendricka Feasel is a 42 y.o. female who was seen today for physical therapy evaluation and treatment for acute on chronic low back pain.  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  Discussed she would be a good candidate for TrDN, possibly with Estim to stimulate multifidi, as well as decrease spasm, but also needed core strengthening due to history of C-section and endometriosis.  Also provided her with information on TENS unit today so could watch for sale pricing.  [name] would benefit from skilled physical therapy to decrease low back pain and improve quality of life.    OBJECTIVE IMPAIRMENTS: decreased activity tolerance, decreased strength, increased fascial  restrictions, increased muscle spasms, and pain.   ACTIVITY LIMITATIONS: lifting, bending, standing, and locomotion level  PARTICIPATION LIMITATIONS: meal prep, cleaning, laundry, and occupation  PERSONAL FACTORS: Time since onset of injury/illness/exacerbation and 3+ comorbidities: Severe endometriosis, uterine fibroids, C-section, arthritis, obesity  are also affecting patient's functional outcome.   REHAB POTENTIAL: Good  CLINICAL DECISION MAKING: Evolving/moderate complexity  EVALUATION COMPLEXITY: Moderate   GOALS: Goals reviewed with patient? Yes  SHORT TERM GOALS: Target date: 08/12/2023   Patient will be independent with initial HEP.  Baseline: needs Goal status: INITIAL  LONG TERM GOALS: Target date: 09/09/2023    Patient will be independent with advanced/ongoing HEP to improve outcomes and carryover.  Baseline:  Goal status: INITIAL  2.  Patient will report 75% improvement in low back pain to improve QOL.  Baseline:  Goal status: INITIAL  3.  Patient will report 6 points improvement on modified Oswestry to demonstrate improved functional ability.  Baseline: 11/50 Goal status: INITIAL   4.  Patient will tolerate 1 hour standing without increased LBP to perform job duties. Baseline: standing increases pain Goal status: INITIAL  PLAN:  PT FREQUENCY: 1-2x/week  PT DURATION: 6 weeks  PLANNED INTERVENTIONS: Therapeutic exercises, Therapeutic activity, Neuromuscular re-education, Balance training, Gait training, Patient/Family education, Self Care, Joint mobilization, Dry Needling, Electrical stimulation, Spinal manipulation, Spinal mobilization, Cryotherapy, Moist heat, Taping, Traction, Ultrasound, Manual therapy, and Re-evaluation.  PLAN FOR NEXT SESSION: address SIJ dysfunction, core strengthening, TENS education, TrDN, may try TrDN with estim to multifidi.     Jena Gauss, PT, DPT 07/29/2023, 12:37 PM

## 2023-07-29 ENCOUNTER — Ambulatory Visit: Payer: 59 | Attending: Gynecologic Oncology | Admitting: Physical Therapy

## 2023-07-29 ENCOUNTER — Encounter: Payer: Self-pay | Admitting: Obstetrics and Gynecology

## 2023-07-29 ENCOUNTER — Encounter: Payer: Self-pay | Admitting: Physical Therapy

## 2023-07-29 ENCOUNTER — Ambulatory Visit (INDEPENDENT_AMBULATORY_CARE_PROVIDER_SITE_OTHER): Payer: 59 | Admitting: Obstetrics and Gynecology

## 2023-07-29 VITALS — BP 139/81 | HR 88 | Ht 69.0 in | Wt 360.0 lb

## 2023-07-29 DIAGNOSIS — N809 Endometriosis, unspecified: Secondary | ICD-10-CM

## 2023-07-29 DIAGNOSIS — M5459 Other low back pain: Secondary | ICD-10-CM | POA: Insufficient documentation

## 2023-07-29 DIAGNOSIS — M545 Low back pain, unspecified: Secondary | ICD-10-CM | POA: Insufficient documentation

## 2023-07-29 DIAGNOSIS — R252 Cramp and spasm: Secondary | ICD-10-CM | POA: Insufficient documentation

## 2023-07-29 DIAGNOSIS — Z1231 Encounter for screening mammogram for malignant neoplasm of breast: Secondary | ICD-10-CM | POA: Insufficient documentation

## 2023-07-29 DIAGNOSIS — Z5181 Encounter for therapeutic drug level monitoring: Secondary | ICD-10-CM | POA: Diagnosis not present

## 2023-07-29 DIAGNOSIS — Z79818 Long term (current) use of other agents affecting estrogen receptors and estrogen levels: Secondary | ICD-10-CM | POA: Diagnosis not present

## 2023-07-29 DIAGNOSIS — M6281 Muscle weakness (generalized): Secondary | ICD-10-CM | POA: Insufficient documentation

## 2023-07-29 MED ORDER — ESTROGENS CONJUGATED 0.625 MG PO TABS: 0.6250 mg | ORAL_TABLET | Freq: Every day | ORAL | 5 refills | Status: DC

## 2023-07-29 MED ORDER — IBUPROFEN 800 MG PO TABS: 800.0000 mg | ORAL_TABLET | Freq: Three times a day (TID) | ORAL | 3 refills | Status: DC | PRN

## 2023-07-29 NOTE — Progress Notes (Signed)
Ms Avena presents  for f/u of Lupron suppression in setting of endometriosis. Following in conjunction with GYN [facility] ( Dr Pricilla Holm). She is doing well with the Lupron. Pain is well controlled. She denies any side affects Pap smear UTD Needs mammogram  Has f/u appt with Dr Pricilla Holm next week  PE AF VSS Lungs clear Heart RRR Abd soft + BS  A/P Endometriosis         Uterine fibroids        Screening Mammogram  Screening mammogram ordered Will continue with Depo Lupron in conjunction with GYN ONC. Will start add back therapy. Add back therapy reviewed with pt. F/U in 6 months

## 2023-07-29 NOTE — Progress Notes (Addendum)
42 y.o. GYN presents for FU of Endometriosis.  Last Lupron Injection was 06/19/23.  Pt has no complaints today.  Last PAP 10/11/2021

## 2023-07-31 ENCOUNTER — Encounter: Payer: Self-pay | Admitting: Gynecologic Oncology

## 2023-08-03 ENCOUNTER — Ambulatory Visit (HOSPITAL_COMMUNITY)
Admission: RE | Admit: 2023-08-03 | Discharge: 2023-08-03 | Disposition: A | Discharge status: Home / Self Care | Payer: 59 | Source: Ambulatory Visit | Attending: Gynecologic Oncology | Admitting: Gynecologic Oncology

## 2023-08-03 DIAGNOSIS — N9489 Other specified conditions associated with female genital organs and menstrual cycle: Secondary | ICD-10-CM | POA: Diagnosis not present

## 2023-08-03 DIAGNOSIS — N809 Endometriosis, unspecified: Secondary | ICD-10-CM | POA: Insufficient documentation

## 2023-08-04 ENCOUNTER — Other Ambulatory Visit: Payer: Self-pay | Admitting: Gynecologic Oncology

## 2023-08-04 DIAGNOSIS — I10 Essential (primary) hypertension: Secondary | ICD-10-CM

## 2023-08-05 ENCOUNTER — Ambulatory Visit: Payer: 59 | Admitting: Physical Therapy

## 2023-08-05 ENCOUNTER — Encounter: Payer: Self-pay | Admitting: Physical Therapy

## 2023-08-05 DIAGNOSIS — M545 Low back pain, unspecified: Secondary | ICD-10-CM | POA: Diagnosis not present

## 2023-08-05 DIAGNOSIS — M5459 Other low back pain: Secondary | ICD-10-CM

## 2023-08-05 DIAGNOSIS — M6281 Muscle weakness (generalized): Secondary | ICD-10-CM

## 2023-08-05 DIAGNOSIS — R252 Cramp and spasm: Secondary | ICD-10-CM

## 2023-08-05 NOTE — Therapy (Signed)
OUTPATIENT PHYSICAL THERAPY TREATMENT   Patient Name: Kristin Boyd MRN: [id number] DOB:07/08/1981, 42 y.o., female Today's Date: 08/05/2023  END OF SESSION:  PT End of Session - 08/05/23 1107     Visit Number 2    Date for PT Re-Evaluation 09/09/23    Authorization Type [employer]    PT Start Time 1104    PT Stop Time 1158    PT Time Calculation (min) 54 min             Past Medical History:  Diagnosis Date   Arthritis    Chronic knee pain    Chronic pelvic pain in female    Hypertension    Kidney stones    Obese    Ovarian cyst    Pneumonia due to COVID-19 virus 05/26/2019   Postoperative state 11/12/2021   Vaginal Pap smear, abnormal    Past Surgical History:  Procedure Laterality Date   CESAREAN SECTION     CHOLECYSTECTOMY     DILATION AND CURETTAGE OF UTERUS N/A 11/12/2021   Procedure: ATTEMPTED HYSTEROSCOPY;  Surgeon: Carver Fila, MD;  Location: [facility];  Service: Gynecology;  Laterality: N/A;   ENDOMETRIAL ABLATION     WISDOM TOOTH EXTRACTION     2 removed   XI ROBOTIC ASSISTED SALPINGECTOMY N/A 11/12/2021   Procedure: DIAGNOSTIC LAPAROSCOPY, PERITONEAL BIOPSIES;  Surgeon: Carver Fila, MD;  Location: [facility];  Service: Gynecology;  Laterality: N/A;   Patient Active Problem List   Diagnosis Date Noted   Screening mammogram for breast cancer 07/29/2023   Encounter for monitoring Lupron therapy 07/29/2023   Fibroid, uterine 02/04/2023   Endometriosis 11/13/2021   Adnexal mass 10/23/2021   Morbid obesity with BMI of 50.0-59.9, adult (HCC) 06/06/2019    PCP: Patient, No Pcp Per   REFERRING PROVIDER: Carver Fila, MD  REFERRING DIAG: M54.50 (ICD-10-CM) - Acute midline low back pain without sciatica  Rationale for Evaluation and Treatment: Rehabilitation  THERAPY DIAG:  Other low back pain  Cramp and spasm  Muscle weakness (generalized)  ONSET DATE:  chronic, recent exacerbation started 1 week ago.    SUBJECTIVE:                                                                                                                                                                                           SUBJECTIVE STATEMENT: Back pain not to bad today.   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   PERTINENT HISTORY:  Severe endometriosis, uterine fibroids, C-section, arthritis, obesity  PAIN:  Are you having pain? Yes: NPRS scale: 4/10 Pain location: across  lower back Pain description: feels like she got punched in back, occasionally shoots down legs but not common Aggravating factors: prolonged standing on hard surfaces at work Relieving factors: icy hot patches  PRECAUTIONS: None  RED FLAGS: None   WEIGHT BEARING RESTRICTIONS: No  FALLS:  Has patient fallen in last 6 months? No  LIVING ENVIRONMENT: Lives with: lives with their family Lives in: House/apartment Stairs: No Has following equipment at home: None  OCCUPATION: work at [employer] as Conservation officer, nature  PLOF: Independent  PATIENT GOALS: decrease pain  NEXT MD VISIT: 08/03/23  OBJECTIVE:   DIAGNOSTIC FINDINGS:  No imaging available of lumbar spine.   PATIENT SURVEYS:  Modified Oswestry 11/50 = 22%   COGNITION: Overall cognitive status: Within functional limits for tasks assessed     SENSATION: WFL  MUSCLE LENGTH: Hamstrings: mild tightness bil  Quadriceps: moderate tightness bil   POSTURE: No Significant postural limitations  PALPATION: Tenderness throughout bil QL, glutes, piriformis, SIJ, lumbar paraspinals, tenderness L2-5 with PA mobs.    LUMBAR ROM:   AROM eval  Flexion Almost to ankles, WNL  Extension WNL, relieves pain  Right lateral flexion WNL, discomfort R SIJ  Left lateral flexion WNL  Right rotation WNL  Left rotation WNL   (Blank rows = not tested)  LOWER EXTREMITY ROM:   decreased hip IR bil  LOWER EXTREMITY MMT:    MMT Right eval Left eval  Hip flexion 5 5  Hip extension 5  5  Hip abduction 5 5  Hip adduction 5 5  Knee flexion    Knee extension 5 5  Ankle dorsiflexion 5 5  Ankle plantarflexion     (Blank rows = not tested)  LUMBAR SPECIAL TESTS:  Straight leg raise test: Negative, FABER test: Positive, and Long sit test: Positive  FUNCTIONAL TESTS:    GAIT: Distance walked: 72' Assistive device utilized: None Level of assistance: Complete Independence Comments: visually slow, feet externally rotated   TODAY'S TREATMENT:                                                                                                                              DATE:   08/05/23 Therapeutic Exercise: to improve strength and mobility.  Demo, verbal and tactile cues throughout for technique. Muscle energy with resisted hip extension (R) to correct SIJ PPT x 20 LTR x 10 small amplitude  Bent knee falls outs x 10  Prone on elbows x 2 min  Prone knee flexion x 10 each side Manual Therapy: to decrease muscle spasm and pain and improve mobility IASTM with foam roller to bil glutes and lumbar paraspinals, PA mobs to lumbar spine.  Modalities: Estim (IFC) to lumbar region with MHP x 10 min in prone.      PATIENT EDUCATION:  Education details: HEP, info on [employer] Person educated: Patient Education method: Explanation, Demonstration, Verbal cues, and Handouts Education comprehension: verbalized understanding and returned demonstration  HOME EXERCISE PROGRAM: Access Code:  [email or web address] URL: [email or web address] Date: 08/05/2023 Prepared by: Harrie Foreman  Exercises - Supine Lower Trunk Rotation  - 1 x daily - 7 x weekly - 2 sets - 10 reps - Supine Posterior Pelvic Tilt  - 1 x daily - 7 x weekly - 2 sets - 10 reps - Bent Knee Fallouts  - 1 x daily - 7 x weekly - 2 sets - 10 reps - Prone Press Up On Elbows  - 1 x daily - 7 x weekly - 1 sets - 10 reps - Prone Knee Flexion  - 1 x daily - 7 x weekly - 1 sets - 10 reps  Patient Education - TENS UNIT -  AUVON Dual Channel TENS Unit  ASSESSMENT:  CLINICAL IMPRESSION: [name] reports no changes since IE.  Today used MET to correct SIJ, with negative long sit test afterwards.  Also started core strengthening exercises with good tolerance, although cues needed throughout to prevent breathholding and needed repositioning due to discomfort laying in one position too long.  Discussed TrDN but declined today.  Manual to decrease muscle tension followed by Estim and MHP, reported decreased pain following.  Provided information on inexpensive TENS unit that can be purchased on [place] if interested.  Luthien Dhanraj continues to demonstrate potential for improvement and would benefit from continued skilled therapy to address impairments.       OBJECTIVE IMPAIRMENTS: decreased activity tolerance, decreased strength, increased fascial restrictions, increased muscle spasms, and pain.   ACTIVITY LIMITATIONS: lifting, bending, standing, and locomotion level  PARTICIPATION LIMITATIONS: meal prep, cleaning, laundry, and occupation  PERSONAL FACTORS: Time since onset of injury/illness/exacerbation and 3+ comorbidities: Severe endometriosis, uterine fibroids, C-section, arthritis, obesity  are also affecting patient's functional outcome.   REHAB POTENTIAL: Good  CLINICAL DECISION MAKING: Evolving/moderate complexity  EVALUATION COMPLEXITY: Moderate   GOALS: Goals reviewed with patient? Yes  SHORT TERM GOALS: Target date: 08/12/2023   Patient will be independent with initial HEP.  Baseline: needs Goal status: IN PROGRESS 08/05/23- given  LONG TERM GOALS: Target date: 09/09/2023    Patient will be independent with advanced/ongoing HEP to improve outcomes and carryover.  Baseline:  Goal status: IN PROGRESS  2.  Patient will report 75% improvement in low back pain to improve QOL.  Baseline:  Goal status: IN PROGRESS  3.  Patient will report 6 points improvement on modified Oswestry to  demonstrate improved functional ability.  Baseline: 11/50 Goal status: IN PROGRESS   4.  Patient will tolerate 1 hour standing without increased LBP to perform job duties. Baseline: standing increases pain Goal status: IN PROGRESS  PLAN:  PT FREQUENCY: 1-2x/week  PT DURATION: 6 weeks  PLANNED INTERVENTIONS: Therapeutic exercises, Therapeutic activity, Neuromuscular re-education, Balance training, Gait training, Patient/Family education, Self Care, Joint mobilization, Dry Needling, Electrical stimulation, Spinal manipulation, Spinal mobilization, Cryotherapy, Moist heat, Taping, Traction, Ultrasound, Manual therapy, and Re-evaluation.  PLAN FOR NEXT SESSION:  progress HEP,  liked Estim, show how to set up our TENS, PT - TrDN if interested, may try TrDN with estim to multifidi.     Jena Gauss, PT, DPT 08/05/2023, 12:05 PM

## 2023-08-09 DIAGNOSIS — R55 Syncope and collapse: Secondary | ICD-10-CM | POA: Diagnosis not present

## 2023-08-11 DIAGNOSIS — R55 Syncope and collapse: Secondary | ICD-10-CM | POA: Diagnosis not present

## 2023-08-12 ENCOUNTER — Other Ambulatory Visit: Payer: Self-pay | Admitting: Gynecologic Oncology

## 2023-08-12 DIAGNOSIS — N9489 Other specified conditions associated with female genital organs and menstrual cycle: Secondary | ICD-10-CM

## 2023-08-12 DIAGNOSIS — N809 Endometriosis, unspecified: Secondary | ICD-10-CM

## 2023-08-12 NOTE — Progress Notes (Signed)
Please schedule this patient for follow-up ultrasound in 4 months followed by phone visit with me. Thank you

## 2023-08-13 ENCOUNTER — Ambulatory Visit: Payer: 59 | Attending: Gynecologic Oncology

## 2023-08-13 ENCOUNTER — Telehealth: Payer: Self-pay

## 2023-08-13 DIAGNOSIS — M6281 Muscle weakness (generalized): Secondary | ICD-10-CM | POA: Insufficient documentation

## 2023-08-13 DIAGNOSIS — M5459 Other low back pain: Secondary | ICD-10-CM | POA: Insufficient documentation

## 2023-08-13 DIAGNOSIS — R252 Cramp and spasm: Secondary | ICD-10-CM | POA: Diagnosis not present

## 2023-08-13 NOTE — Telephone Encounter (Signed)
-----   Message from Carver Fila sent at 08/12/2023  5:29 PM EDT ----- Please schedule this patient for follow-up ultrasound in 4 months followed by phone visit with me. Thank you

## 2023-08-13 NOTE — Therapy (Signed)
OUTPATIENT PHYSICAL THERAPY TREATMENT   Patient Name: Kristin Boyd MRN: [id number] DOB:[birth year], 42 y.o., female Today's Date: 08/13/2023  END OF SESSION:  PT End of Session - 08/13/23 0949     Visit Number 3    Date for PT Re-Evaluation 09/09/23    Authorization Type [employer]    PT Start Time 0935    PT Stop Time 1023    PT Time Calculation (min) 48 min    Activity Tolerance Patient tolerated treatment well    Behavior During Therapy [facility] for tasks assessed/performed              Past Medical History:  Diagnosis Date   Arthritis    Chronic knee pain    Chronic pelvic pain in female    Hypertension    Kidney stones    Obese    Ovarian cyst    Pneumonia due to COVID-19 virus 05/26/2019   Postoperative state 11/12/2021   Vaginal Pap smear, abnormal    Past Surgical History:  Procedure Laterality Date   CESAREAN SECTION     CHOLECYSTECTOMY     DILATION AND CURETTAGE OF UTERUS N/A 11/12/2021   Procedure: ATTEMPTED HYSTEROSCOPY;  Surgeon: Carver Fila, MD;  Location: [facility];  Service: Gynecology;  Laterality: N/A;   ENDOMETRIAL ABLATION     WISDOM TOOTH EXTRACTION     2 removed   XI ROBOTIC ASSISTED SALPINGECTOMY N/A 11/12/2021   Procedure: DIAGNOSTIC LAPAROSCOPY, PERITONEAL BIOPSIES;  Surgeon: Carver Fila, MD;  Location: [facility];  Service: Gynecology;  Laterality: N/A;   Patient Active Problem List   Diagnosis Date Noted   Screening mammogram for breast cancer 07/29/2023   Encounter for monitoring Lupron therapy 07/29/2023   Fibroid, uterine 02/04/2023   Endometriosis 11/13/2021   Adnexal mass 10/23/2021   Morbid obesity with BMI of 50.0-59.9, adult (HCC) 06/06/2019    PCP: Patient, No Pcp Per   REFERRING PROVIDER: Carver Fila, MD  REFERRING DIAG: M54.50 (ICD-10-CM) - Acute midline low back pain without sciatica  Rationale for Evaluation and Treatment: Rehabilitation  THERAPY DIAG:  Other low back pain  Cramp and  spasm  Muscle weakness (generalized)  ONSET DATE:  chronic, recent exacerbation started 1 week ago.   SUBJECTIVE:                                                                                                                                                                                           SUBJECTIVE STATEMENT: Pt  PERTINENT HISTORY:  Severe endometriosis, uterine fibroids, C-section, arthritis, obesity  PAIN:  Are you having pain? Yes: NPRS scale: 4/10 Pain location:  across lower back Pain description: feels like she got punched in back, occasionally shoots down legs but not common Aggravating factors: prolonged standing on hard surfaces at work Relieving factors: icy hot patches  PRECAUTIONS: None  RED FLAGS: None   WEIGHT BEARING RESTRICTIONS: No  FALLS:  Has patient fallen in last 6 months? No  LIVING ENVIRONMENT: Lives with: lives with their family Lives in: House/apartment Stairs: No Has following equipment at home: None  OCCUPATION: work at [employer] as Conservation officer, nature  PLOF: Independent  PATIENT GOALS: decrease pain  NEXT MD VISIT: 08/03/23  OBJECTIVE:   DIAGNOSTIC FINDINGS:  No imaging available of lumbar spine.   PATIENT SURVEYS:  Modified Oswestry 11/50 = 22%   COGNITION: Overall cognitive status: Within functional limits for tasks assessed     SENSATION: WFL  MUSCLE LENGTH: Hamstrings: mild tightness bil  Quadriceps: moderate tightness bil   POSTURE: No Significant postural limitations  PALPATION: Tenderness throughout bil QL, glutes, piriformis, SIJ, lumbar paraspinals, tenderness L2-5 with PA mobs.    LUMBAR ROM:   AROM eval  Flexion Almost to ankles, WNL  Extension WNL, relieves pain  Right lateral flexion WNL, discomfort R SIJ  Left lateral flexion WNL  Right rotation WNL  Left rotation WNL   (Blank rows = not tested)  LOWER EXTREMITY ROM:   decreased hip IR bil  LOWER EXTREMITY MMT:    MMT Right eval Left eval  Hip  flexion 5 5  Hip extension 5 5  Hip abduction 5 5  Hip adduction 5 5  Knee flexion    Knee extension 5 5  Ankle dorsiflexion 5 5  Ankle plantarflexion     (Blank rows = not tested)  LUMBAR SPECIAL TESTS:  Straight leg raise test: Negative, FABER test: Positive, and Long sit test: Positive  FUNCTIONAL TESTS:    GAIT: Distance walked: 64' Assistive device utilized: None Level of assistance: Complete Independence Comments: visually slow, feet externally rotated   TODAY'S TREATMENT:                                                                                                                              DATE:  08/13/23 Therapeutic Exercise: to improve strength and mobility.  Demo, verbal and tactile cues throughout for technique. Bike L1x60min Standing lumbar extension 10x3" Standing hip extension 2x10 Standing hip abduction 2x10 Modified downward dog at counter x 5 Hip ADD ball squeeze 10x3" Seated hip ABD blue TB 10x5"  Modalities: Estim (IFC) to lumbar region with MHP x 10 min in prone.    08/05/23 Therapeutic Exercise: to improve strength and mobility.  Demo, verbal and tactile cues throughout for technique. Muscle energy with resisted hip extension (R) to correct SIJ PPT x 20 LTR x 10 small amplitude  Bent knee falls outs x 10  Prone on elbows x 2 min  Prone knee flexion x 10 each side Manual Therapy: to decrease muscle spasm and pain and improve mobility IASTM  with foam roller to bil glutes and lumbar paraspinals, PA mobs to lumbar spine.  Modalities: Estim (IFC) to lumbar region with MHP x 10 min in prone.      PATIENT EDUCATION:  Education details: HEP, info on [employer] Person educated: Patient Education method: Explanation, Demonstration, Verbal cues, and Handouts Education comprehension: verbalized understanding and returned demonstration  HOME EXERCISE PROGRAM: Access Code: [email or web address] URL: [email or web address] Date: 08/05/2023 Prepared by:  Harrie Foreman  Exercises - Supine Lower Trunk Rotation  - 1 x daily - 7 x weekly - 2 sets - 10 reps - Supine Posterior Pelvic Tilt  - 1 x daily - 7 x weekly - 2 sets - 10 reps - Bent Knee Fallouts  - 1 x daily - 7 x weekly - 2 sets - 10 reps - Prone Press Up On Elbows  - 1 x daily - 7 x weekly - 1 sets - 10 reps - Prone Knee Flexion  - 1 x daily - 7 x weekly - 1 sets - 10 reps  Patient Education - TENS UNIT - AUVON Dual Channel TENS Unit  ASSESSMENT:  CLINICAL IMPRESSION:  Progressed patient through standing exercises as this is more convenient for her to be more compliant with HEP. She demonstrated a good response to the treatment. Has looked into TENS unit but unable to purchase right now d/t financial reasons. Estim post session with moist heat to address pain. Starlit Schantz continues to demonstrate potential for improvement and would benefit from continued skilled therapy to address impairments.       OBJECTIVE IMPAIRMENTS: decreased activity tolerance, decreased strength, increased fascial restrictions, increased muscle spasms, and pain.   ACTIVITY LIMITATIONS: lifting, bending, standing, and locomotion level  PARTICIPATION LIMITATIONS: meal prep, cleaning, laundry, and occupation  PERSONAL FACTORS: Time since onset of injury/illness/exacerbation and 3+ comorbidities: Severe endometriosis, uterine fibroids, C-section, arthritis, obesity  are also affecting patient's functional outcome.   REHAB POTENTIAL: Good  CLINICAL DECISION MAKING: Evolving/moderate complexity  EVALUATION COMPLEXITY: Moderate   GOALS: Goals reviewed with patient? Yes  SHORT TERM GOALS: Target date: 08/12/2023   Patient will be independent with initial HEP.  Baseline: needs Goal status: IN PROGRESS 08/05/23- given  LONG TERM GOALS: Target date: 09/09/2023    Patient will be independent with advanced/ongoing HEP to improve outcomes and carryover.  Baseline:  Goal status: IN PROGRESS  2.   Patient will report 75% improvement in low back pain to improve QOL.  Baseline:  Goal status: IN PROGRESS  3.  Patient will report 6 points improvement on modified Oswestry to demonstrate improved functional ability.  Baseline: 11/50 Goal status: IN PROGRESS   4.  Patient will tolerate 1 hour standing without increased LBP to perform job duties. Baseline: standing increases pain Goal status: IN PROGRESS  PLAN:  PT FREQUENCY: 1-2x/week  PT DURATION: 6 weeks  PLANNED INTERVENTIONS: Therapeutic exercises, Therapeutic activity, Neuromuscular re-education, Balance training, Gait training, Patient/Family education, Self Care, Joint mobilization, Dry Needling, Electrical stimulation, Spinal manipulation, Spinal mobilization, Cryotherapy, Moist heat, Taping, Traction, Ultrasound, Manual therapy, and Re-evaluation.  PLAN FOR NEXT SESSION:  progress HEP,  liked Estim, show how to set up our TENS, PT - TrDN if interested, may try TrDN with estim to multifidi.     Darleene Cleaver, PTA 08/13/2023, 12:07 PM

## 2023-08-13 NOTE — Telephone Encounter (Signed)
Ultrasound scheduled for 01/01/24 @ 11:00 with 10:45 arrival time.   Follow up phone visit scheduled with Pricilla Holm on 01/07/24 @ 4:30.   Pt is aware and agrees to date and time

## 2023-08-17 ENCOUNTER — Ambulatory Visit: Payer: 59 | Admitting: Physical Therapy

## 2023-08-17 ENCOUNTER — Encounter: Payer: Self-pay | Admitting: Physical Therapy

## 2023-08-17 DIAGNOSIS — M5459 Other low back pain: Secondary | ICD-10-CM | POA: Diagnosis not present

## 2023-08-17 DIAGNOSIS — R252 Cramp and spasm: Secondary | ICD-10-CM | POA: Diagnosis not present

## 2023-08-17 DIAGNOSIS — M6281 Muscle weakness (generalized): Secondary | ICD-10-CM | POA: Diagnosis not present

## 2023-08-17 NOTE — Therapy (Signed)
OUTPATIENT PHYSICAL THERAPY TREATMENT   Patient Name: Jesus Cioni MRN: [id number] DOB:11/19/1981, 42 y.o., female Today's Date: 08/17/2023  END OF SESSION:  PT End of Session - 08/17/23 1026     Visit Number 4    Date for PT Re-Evaluation 09/09/23    Authorization Type [employer]    PT Start Time 1021    PT Stop Time 1115    PT Time Calculation (min) 54 min    Activity Tolerance Patient tolerated treatment well    Behavior During Therapy [facility] for tasks assessed/performed              Past Medical History:  Diagnosis Date   Arthritis    Chronic knee pain    Chronic pelvic pain in female    Hypertension    Kidney stones    Obese    Ovarian cyst    Pneumonia due to COVID-19 virus 05/26/2019   Postoperative state 11/12/2021   Vaginal Pap smear, abnormal    Past Surgical History:  Procedure Laterality Date   CESAREAN SECTION     CHOLECYSTECTOMY     DILATION AND CURETTAGE OF UTERUS N/A 11/12/2021   Procedure: ATTEMPTED HYSTEROSCOPY;  Surgeon: Carver Fila, MD;  Location: [facility];  Service: Gynecology;  Laterality: N/A;   ENDOMETRIAL ABLATION     WISDOM TOOTH EXTRACTION     2 removed   XI ROBOTIC ASSISTED SALPINGECTOMY N/A 11/12/2021   Procedure: DIAGNOSTIC LAPAROSCOPY, PERITONEAL BIOPSIES;  Surgeon: Carver Fila, MD;  Location: [facility];  Service: Gynecology;  Laterality: N/A;   Patient Active Problem List   Diagnosis Date Noted   Screening mammogram for breast cancer 07/29/2023   Encounter for monitoring Lupron therapy 07/29/2023   Fibroid, uterine 02/04/2023   Endometriosis 11/13/2021   Adnexal mass 10/23/2021   Morbid obesity with BMI of 50.0-59.9, adult (HCC) 06/06/2019    PCP: Patient, No Pcp Per   REFERRING PROVIDER: Carver Fila, MD  REFERRING DIAG: M54.50 (ICD-10-CM) - Acute midline low back pain without sciatica  Rationale for Evaluation and Treatment: Rehabilitation  THERAPY DIAG:  Other low back pain  Cramp and  spasm  Muscle weakness (generalized)  ONSET DATE:  chronic, recent exacerbation started 1 week ago.   SUBJECTIVE:                                                                                                                                                                                           SUBJECTIVE STATEMENT: Pt  reports a little pain when she woke up this morning, better now.    PERTINENT HISTORY:  Severe endometriosis, uterine fibroids, C-section,  arthritis, obesity  PAIN:  Are you having pain? Yes: NPRS scale: 3/10 Pain location: across lower back Pain description: feels like she got punched in back, occasionally shoots down legs but not common Aggravating factors: prolonged standing on hard surfaces at work Relieving factors: icy hot patches  PRECAUTIONS: None  RED FLAGS: None   WEIGHT BEARING RESTRICTIONS: No  FALLS:  Has patient fallen in last 6 months? No  LIVING ENVIRONMENT: Lives with: lives with their family Lives in: House/apartment Stairs: No Has following equipment at home: None  OCCUPATION: work at [employer] as Conservation officer, nature  PLOF: Independent  PATIENT GOALS: decrease pain  NEXT MD VISIT: 08/03/23  OBJECTIVE:   DIAGNOSTIC FINDINGS:  No imaging available of lumbar spine.   PATIENT SURVEYS:  Modified Oswestry 11/50 = 22%   COGNITION: Overall cognitive status: Within functional limits for tasks assessed     SENSATION: WFL  MUSCLE LENGTH: Hamstrings: mild tightness bil  Quadriceps: moderate tightness bil   POSTURE: No Significant postural limitations  PALPATION: Tenderness throughout bil QL, glutes, piriformis, SIJ, lumbar paraspinals, tenderness L2-5 with PA mobs.    LUMBAR ROM:   AROM eval  Flexion Almost to ankles, WNL  Extension WNL, relieves pain  Right lateral flexion WNL, discomfort R SIJ  Left lateral flexion WNL  Right rotation WNL  Left rotation WNL   (Blank rows = not tested)  LOWER EXTREMITY ROM:   decreased hip IR  bil  LOWER EXTREMITY MMT:    MMT Right eval Left eval  Hip flexion 5 5  Hip extension 5 5  Hip abduction 5 5  Hip adduction 5 5  Knee flexion    Knee extension 5 5  Ankle dorsiflexion 5 5  Ankle plantarflexion     (Blank rows = not tested)  LUMBAR SPECIAL TESTS:  Straight leg raise test: Negative, FABER test: Positive, and Long sit test: Positive  FUNCTIONAL TESTS:    GAIT: Distance walked: 11' Assistive device utilized: None Level of assistance: Complete Independence Comments: visually slow, feet externally rotated   TODAY'S TREATMENT:                                                                                                                              DATE:   08/17/23 Therapeutic Exercise: to improve strength and mobility.  Demo, verbal and tactile cues throughout for technique.  Nustep L4 x 6 min  Seated PPT Seated Step overs Seated LAQ Therapeutic Activity:  Therapeutic Activity:  education on lifting mechanics, with demo  lifting box from floor through squatting, keeping heavy items close to spine while carrying, avoiding twisting with load, raising head before lifting to make sure spine is straight.  Pt. Verbalized understanding.  Manual Therapy: to decrease muscle spasm and pain and improve mobility IASTM with foam roller to bil glutes and lumbar paraspinals.  Modalities: Estim (IFC) to lumbar region with MHP x 10 min in prone to decrease muscle spasm and pain.  08/13/23 Therapeutic Exercise: to improve strength and mobility.  Demo, verbal and tactile cues throughout for technique. Bike L1x30min Standing lumbar extension 10x3" Standing hip extension 2x10 Standing hip abduction 2x10 Modified downward dog at counter x 5 Hip ADD ball squeeze 10x3" Seated hip ABD blue TB 10x5"  Modalities: Estim (IFC) to lumbar region with MHP x 10 min in prone.    08/05/23 Therapeutic Exercise: to improve strength and mobility.  Demo, verbal and tactile cues throughout for  technique. Muscle energy with resisted hip extension (R) to correct SIJ PPT x 20 LTR x 10 small amplitude  Bent knee falls outs x 10  Prone on elbows x 2 min  Prone knee flexion x 10 each side Manual Therapy: to decrease muscle spasm and pain and improve mobility IASTM with foam roller to bil glutes and lumbar paraspinals, PA mobs to lumbar spine.  Modalities: Estim (IFC) to lumbar region with MHP x 10 min in prone.      PATIENT EDUCATION:  Education details: HEP review  Person educated: Patient Education method: Programmer, multimedia, Facilities manager, Verbal cues, and Handouts Education comprehension: verbalized understanding and returned demonstration  HOME EXERCISE PROGRAM: Access Code: [email or web address] URL: [email or web address] Date: 08/17/2023 Prepared by: Harrie Foreman  Exercises - Supine Lower Trunk Rotation  - 1 x daily - 7 x weekly - 2 sets - 10 reps - Supine Posterior Pelvic Tilt  - 1 x daily - 7 x weekly - 2 sets - 10 reps - Bent Knee Fallouts  - 1 x daily - 7 x weekly - 2 sets - 10 reps - Prone Press Up On Elbows  - 1 x daily - 7 x weekly - 1 sets - 10 reps - Prone Knee Flexion  - 1 x daily - 7 x weekly - 1 sets - 10 reps - Standing Lumbar Extension at Wall - Forearms  - 1 x daily - 7 x weekly - 3 sets - 10 reps - Standing Hip Extension with Counter Support  - 1 x daily - 7 x weekly - 3 sets - 10 reps - Standing Hip Abduction with Counter Support  - 1 x daily - 7 x weekly - 3 sets - 10 reps - Seated Hip Abduction with Resistance  - 1 x daily - 7 x weekly - 3 sets - 10 reps - Seated March with Resistance  - 1 x daily - 7 x weekly - 3 sets - 10 reps - Seated Pelvic Tilt  - 1 x daily - 7 x weekly - 1-3 sets - 10 reps - Seated Long Arc Quad  - 1 x daily - 7 x weekly - 1-3 sets - 10 reps  Patient Education - TENS UNIT - AUVON Dual Channel TENS Unit  ASSESSMENT:  CLINICAL IMPRESSION: Reviewed HEP, discussed exercise "snacks" doing standing exercises for pain  throughout the day as needed, also sitting exercises as able for short periods so not to feel overwhelmed trying to find time to do many exercises.  She reported good core engagement with seated exercises today.  Since she is currently packing up in preparation for moving this week, also reviewed body mechanics for safety with lifting to avoid back strain.  Lavra Petri continues to demonstrate potential for improvement and would benefit from continued skilled therapy to address impairments.       OBJECTIVE IMPAIRMENTS: decreased activity tolerance, decreased strength, increased fascial restrictions, increased muscle spasms, and pain.   ACTIVITY LIMITATIONS: lifting, bending, standing, and locomotion level  PARTICIPATION LIMITATIONS:  meal prep, cleaning, laundry, and occupation  PERSONAL FACTORS: Time since onset of injury/illness/exacerbation and 3+ comorbidities: Severe endometriosis, uterine fibroids, C-section, arthritis, obesity  are also affecting patient's functional outcome.   REHAB POTENTIAL: Good  CLINICAL DECISION MAKING: Evolving/moderate complexity  EVALUATION COMPLEXITY: Moderate   GOALS: Goals reviewed with patient? Yes  SHORT TERM GOALS: Target date: 08/12/2023   Patient will be independent with initial HEP.  Baseline: needs Goal status: IN PROGRESS 08/05/23- given  LONG TERM GOALS: Target date: 09/09/2023    Patient will be independent with advanced/ongoing HEP to improve outcomes and carryover.  Baseline:  Goal status: IN PROGRESS  2.  Patient will report 75% improvement in low back pain to improve QOL.  Baseline:  Goal status: IN PROGRESS  3.  Patient will report 6 points improvement on modified Oswestry to demonstrate improved functional ability.  Baseline: 11/50 Goal status: IN PROGRESS   4.  Patient will tolerate 1 hour standing without increased LBP to perform job duties. Baseline: standing increases pain Goal status: IN PROGRESS  PLAN:  PT  FREQUENCY: 1-2x/week  PT DURATION: 6 weeks  PLANNED INTERVENTIONS: Therapeutic exercises, Therapeutic activity, Neuromuscular re-education, Balance training, Gait training, Patient/Family education, Self Care, Joint mobilization, Dry Needling, Electrical stimulation, Spinal manipulation, Spinal mobilization, Cryotherapy, Moist heat, Taping, Traction, Ultrasound, Manual therapy, and Re-evaluation.  PLAN FOR NEXT SESSION:  progress HEP,  liked Estim, show how to set up our TENS, PT - TrDN if interested, may try TrDN with estim to multifidi.     Jena Gauss, PT, DPT 08/17/2023, 12:16 PM

## 2023-08-18 ENCOUNTER — Encounter: Payer: Self-pay | Admitting: Gynecologic Oncology

## 2023-08-20 ENCOUNTER — Ambulatory Visit: Payer: 59

## 2023-08-21 ENCOUNTER — Encounter: Payer: Self-pay | Admitting: Gynecologic Oncology

## 2023-08-24 ENCOUNTER — Ambulatory Visit: Payer: 59 | Admitting: Physical Therapy

## 2023-08-27 ENCOUNTER — Ambulatory Visit: Payer: 59

## 2023-08-31 ENCOUNTER — Ambulatory Visit: Payer: 59

## 2023-09-02 ENCOUNTER — Ambulatory Visit: Payer: 59 | Admitting: Physical Therapy

## 2023-09-09 ENCOUNTER — Ambulatory Visit: Payer: 59 | Attending: Gynecologic Oncology | Admitting: Physical Therapy

## 2023-09-09 ENCOUNTER — Encounter: Payer: Self-pay | Admitting: Physical Therapy

## 2023-09-09 DIAGNOSIS — R252 Cramp and spasm: Secondary | ICD-10-CM | POA: Diagnosis not present

## 2023-09-09 DIAGNOSIS — M5459 Other low back pain: Secondary | ICD-10-CM | POA: Diagnosis not present

## 2023-09-09 DIAGNOSIS — M6281 Muscle weakness (generalized): Secondary | ICD-10-CM | POA: Insufficient documentation

## 2023-09-09 NOTE — Therapy (Addendum)
OUTPATIENT PHYSICAL THERAPY TREATMENT/Discharge summary   Patient Name: Kristin Boyd MRN: [id number] DOB:12-02-1981, 42 y.o., female Today's Date: 09/09/2023  END OF SESSION:  PT End of Session - 09/09/23 0933     Visit Number 5    Date for PT Re-Evaluation 09/09/23    Authorization Type [employer]    PT Start Time 0932    PT Stop Time 1023    PT Time Calculation (min) 51 min    Activity Tolerance Patient tolerated treatment well    Behavior During Therapy [facility] for tasks assessed/performed              Past Medical History:  Diagnosis Date   Arthritis    Chronic knee pain    Chronic pelvic pain in female    Hypertension    Kidney stones    Obese    Ovarian cyst    Pneumonia due to COVID-19 virus 05/26/2019   Postoperative state 11/12/2021   Vaginal Pap smear, abnormal    Past Surgical History:  Procedure Laterality Date   CESAREAN SECTION     CHOLECYSTECTOMY     DILATION AND CURETTAGE OF UTERUS N/A 11/12/2021   Procedure: ATTEMPTED HYSTEROSCOPY;  Surgeon: Carver Fila, MD;  Location: [facility];  Service: Gynecology;  Laterality: N/A;   ENDOMETRIAL ABLATION     WISDOM TOOTH EXTRACTION     2 removed   XI ROBOTIC ASSISTED SALPINGECTOMY N/A 11/12/2021   Procedure: DIAGNOSTIC LAPAROSCOPY, PERITONEAL BIOPSIES;  Surgeon: Carver Fila, MD;  Location: [facility];  Service: Gynecology;  Laterality: N/A;   Patient Active Problem List   Diagnosis Date Noted   Screening mammogram for breast cancer 07/29/2023   Encounter for monitoring Lupron therapy 07/29/2023   Fibroid, uterine 02/04/2023   Endometriosis 11/13/2021   Adnexal mass 10/23/2021   Morbid obesity with BMI of 50.0-59.9, adult (HCC) 06/06/2019    PCP: Patient, No Pcp Per   REFERRING PROVIDER: Carver Fila, MD  REFERRING DIAG: M54.50 (ICD-10-CM) - Acute midline low back pain without sciatica  Rationale for Evaluation and Treatment: Rehabilitation  THERAPY DIAG:  Other low  back pain  Cramp and spasm  Muscle weakness (generalized)  ONSET DATE:  chronic, recent exacerbation started 1 week ago.   SUBJECTIVE:                                                                                                                                                                                           SUBJECTIVE STATEMENT: Patient moved last week, lot of packing and carrying boxes, also slipped while unloading boxes, so that didn't help her back.  PERTINENT HISTORY:  Severe endometriosis, uterine fibroids, C-section, arthritis, obesity  PAIN:  Are you having pain? Yes: NPRS scale: 5/10 Pain location: across lower back Pain description: feels like she got punched in back, occasionally shoots down legs but not common Aggravating factors: prolonged standing on hard surfaces at work Relieving factors: icy hot patches  PRECAUTIONS: None  RED FLAGS: None   WEIGHT BEARING RESTRICTIONS: No  FALLS:  Has patient fallen in last 6 months? No  LIVING ENVIRONMENT: Lives with: lives with their family Lives in: House/apartment Stairs: No Has following equipment at home: None  OCCUPATION: work at [employer] as Conservation officer, nature  PLOF: Independent  PATIENT GOALS: decrease pain  NEXT MD VISIT: 08/03/23  OBJECTIVE:   DIAGNOSTIC FINDINGS:  No imaging available of lumbar spine.   PATIENT SURVEYS:  Modified Oswestry 11/50 = 22%   COGNITION: Overall cognitive status: Within functional limits for tasks assessed     SENSATION: WFL  MUSCLE LENGTH: Hamstrings: mild tightness bil  Quadriceps: moderate tightness bil   POSTURE: No Significant postural limitations  PALPATION: Tenderness throughout bil QL, glutes, piriformis, SIJ, lumbar paraspinals, tenderness L2-5 with PA mobs.    LUMBAR ROM:   AROM eval  Flexion Almost to ankles, WNL  Extension WNL, relieves pain  Right lateral flexion WNL, discomfort R SIJ  Left lateral flexion WNL  Right rotation WNL  Left  rotation WNL   (Blank rows = not tested)  LOWER EXTREMITY ROM:   decreased hip IR bil  LOWER EXTREMITY MMT:    MMT Right eval Left eval  Hip flexion 5 5  Hip extension 5 5  Hip abduction 5 5  Hip adduction 5 5  Knee flexion    Knee extension 5 5  Ankle dorsiflexion 5 5  Ankle plantarflexion     (Blank rows = not tested)  LUMBAR SPECIAL TESTS:  Straight leg raise test: Negative, FABER test: Positive, and Long sit test: Positive  FUNCTIONAL TESTS:    GAIT: Distance walked: 21' Assistive device utilized: None Level of assistance: Complete Independence Comments: visually slow, feet externally rotated   TODAY'S TREATMENT:                                                                                                                              DATE:   09/09/23 Therapeutic Exercise: to improve strength and mobility.  Demo, verbal and tactile cues throughout for technique. Supine LTR x 10  Bridges 2 x 10  Prone press-ups 2 x 10  Prone leg extensions 2 x 5 each side Manual Therapy: to decrease muscle spasm and pain and improve mobility STM/TPR to bil QL, lumbar erector spinea, glutes/pirifiromis, IASTM with foam roller to bil glutes Modalities: Estim (TENS) to lumbar region with MHP x 10 min in prone to decrease muscle spasm and pain.   08/17/23 Therapeutic Exercise: to improve strength and mobility.  Demo, verbal and tactile cues throughout for technique.  Nustep L4 x 6 min  Seated PPT Seated Step overs Seated LAQ Therapeutic Activity:  Therapeutic Activity:  education on lifting mechanics, with demo  lifting box from floor through squatting, keeping heavy items close to spine while carrying, avoiding twisting with load, raising head before lifting to make sure spine is straight.  Pt. Verbalized understanding.  Manual Therapy: to decrease muscle spasm and pain and improve mobility IASTM with foam roller to bil glutes and lumbar paraspinals.  Modalities: Estim (IFC) to  lumbar region with MHP x 10 min in prone to decrease muscle spasm and pain.    08/13/23 Therapeutic Exercise: to improve strength and mobility.  Demo, verbal and tactile cues throughout for technique. Bike L1x55min Standing lumbar extension 10x3" Standing hip extension 2x10 Standing hip abduction 2x10 Modified downward dog at counter x 5 Hip ADD ball squeeze 10x3" Seated hip ABD blue TB 10x5"  Modalities: Estim (IFC) to lumbar region with MHP x 10 min in prone.     PATIENT EDUCATION:  Education details: HEP review  Person educated: Patient Education method: Programmer, multimedia, Facilities manager, Verbal cues, and Handouts Education comprehension: verbalized understanding and returned demonstration  HOME EXERCISE PROGRAM: Access Code: [email or web address] URL: [email or web address] Date: 08/17/2023 Prepared by: Harrie Foreman  Exercises - Supine Lower Trunk Rotation  - 1 x daily - 7 x weekly - 2 sets - 10 reps - Supine Posterior Pelvic Tilt  - 1 x daily - 7 x weekly - 2 sets - 10 reps - Bent Knee Fallouts  - 1 x daily - 7 x weekly - 2 sets - 10 reps - Prone Press Up On Elbows  - 1 x daily - 7 x weekly - 1 sets - 10 reps - Prone Knee Flexion  - 1 x daily - 7 x weekly - 1 sets - 10 reps - Standing Lumbar Extension at Wall - Forearms  - 1 x daily - 7 x weekly - 3 sets - 10 reps - Standing Hip Extension with Counter Support  - 1 x daily - 7 x weekly - 3 sets - 10 reps - Standing Hip Abduction with Counter Support  - 1 x daily - 7 x weekly - 3 sets - 10 reps - Seated Hip Abduction with Resistance  - 1 x daily - 7 x weekly - 3 sets - 10 reps - Seated March with Resistance  - 1 x daily - 7 x weekly - 3 sets - 10 reps - Seated Pelvic Tilt  - 1 x daily - 7 x weekly - 1-3 sets - 10 reps - Seated Long Arc Quad  - 1 x daily - 7 x weekly - 1-3 sets - 10 reps  Patient Education - TENS UNIT - AUVON Dual Channel TENS Unit  ASSESSMENT:  CLINICAL IMPRESSION: Makena Guevara reported more pain  today after move and fall.  She tolerated continued core strengthening with focus on extension, followed by manual therapy and TENS, with decreased pain reported following.   Navi Speltz continues to demonstrate potential for improvement and would benefit from continued skilled therapy to address impairments.       OBJECTIVE IMPAIRMENTS: decreased activity tolerance, decreased strength, increased fascial restrictions, increased muscle spasms, and pain.   ACTIVITY LIMITATIONS: lifting, bending, standing, and locomotion level  PARTICIPATION LIMITATIONS: meal prep, cleaning, laundry, and occupation  PERSONAL FACTORS: Time since onset of injury/illness/exacerbation and 3+ comorbidities: Severe endometriosis, uterine fibroids, C-section, arthritis, obesity  are also affecting patient's functional outcome.   REHAB POTENTIAL: Good  CLINICAL DECISION MAKING: Evolving/moderate complexity  EVALUATION COMPLEXITY: Moderate   GOALS: Goals reviewed with patient? Yes  SHORT TERM GOALS: Target date: 08/12/2023   Patient will be independent with initial HEP.  Baseline: needs Goal status: IN PROGRESS 08/05/23- given   LONG TERM GOALS: Target date: 09/09/2023    Patient will be independent with advanced/ongoing HEP to improve outcomes and carryover.  Baseline:  Goal status: MET 09/09/23- met, consistent with extension exercises throughout the day for pain relief.   2.  Patient will report 75% improvement in low back pain to improve QOL.  Baseline:  Goal status: IN PROGRESS 09/09/23- improved but aggravated by recent move and fall.   3.  Patient will report 6 points improvement on modified Oswestry to demonstrate improved functional ability.  Baseline: 11/50 Goal status: IN PROGRESS   4.  Patient will tolerate 1 hour standing without increased LBP to perform job duties. Baseline: standing increases pain Goal status: IN PROGRESS 09/09/23- improved tolerance with extension exercises.    PLAN:  PT FREQUENCY: 1-2x/week  PT DURATION: 6 weeks  PLANNED INTERVENTIONS: Therapeutic exercises, Therapeutic activity, Neuromuscular re-education, Balance training, Gait training, Patient/Family education, Self Care, Joint mobilization, Dry Needling, Electrical stimulation, Spinal manipulation, Spinal mobilization, Cryotherapy, Moist heat, Taping, Traction, Ultrasound, Manual therapy, and Re-evaluation.  PLAN FOR NEXT SESSION:  30 day hold    Jena Gauss, PT, DPT 09/09/2023, 12:19 PM   PHYSICAL THERAPY DISCHARGE SUMMARY  Visits from Start of Care: 5  Current functional level related to goals / functional outcomes: Decreased back pain, improved functional mobility    Remaining deficits: Back pain   Education / Equipment: HEP  Plan: Refer to above clinical impression and goal assessment for status as of last visit on 09/09/23. Patient did not return to therapy after this visit and as it has been 30+ days, will proceed with discharge from PT for this episode.      Jena Gauss, PT  11/18/2023 3:03 PM

## 2023-10-12 ENCOUNTER — Other Ambulatory Visit: Payer: Self-pay | Admitting: Gynecologic Oncology

## 2023-10-12 DIAGNOSIS — I10 Essential (primary) hypertension: Secondary | ICD-10-CM

## 2023-10-15 ENCOUNTER — Ambulatory Visit
Admission: RE | Admit: 2023-10-15 | Discharge: 2023-10-15 | Disposition: A | Discharge status: Home / Self Care | Payer: 59 | Source: Ambulatory Visit | Attending: Obstetrics and Gynecology | Admitting: Obstetrics and Gynecology

## 2023-10-15 DIAGNOSIS — Z1231 Encounter for screening mammogram for malignant neoplasm of breast: Secondary | ICD-10-CM

## 2023-11-04 ENCOUNTER — Encounter: Payer: Medicaid Other | Admitting: Internal Medicine

## 2023-11-18 ENCOUNTER — Telehealth: Payer: Self-pay | Admitting: *Deleted

## 2023-11-18 NOTE — Telephone Encounter (Signed)
Per provider moved appt from 1/30 to 2/5 patient aware

## 2023-12-22 ENCOUNTER — Encounter: Payer: Self-pay | Admitting: Gynecologic Oncology

## 2024-01-01 ENCOUNTER — Telehealth: Payer: Self-pay | Admitting: *Deleted

## 2024-01-01 ENCOUNTER — Ambulatory Visit (HOSPITAL_COMMUNITY): Payer: 59

## 2024-01-01 NOTE — Telephone Encounter (Signed)
Patient called and canceled appts for the [place] today and phone visit on 2/5. Patient stated that she had no transportation and no current insurance. Patient stated that she will call back once she gets new insurance.

## 2024-01-01 NOTE — Telephone Encounter (Signed)
Spoke with Ms. Herberger and asked about her symptoms. Pt states she is doing well and denies pelvic/abdominal pain and she is not having any symptoms other than some lower back pain. Pt states she last saw Dr. Alysia Penna in August 2024, and per his progress note pt last received her Lupron injection on 06/19/2023. Pt states she is due to see him again next month but is waiting for her new health insurance to begin before scheduling an appointment. Advised patient to call the office once she has her insurance plan. Pt thanked the office and message relayed to provider.

## 2024-01-07 ENCOUNTER — Telehealth: Payer: Medicaid Other | Admitting: Gynecologic Oncology

## 2024-01-13 ENCOUNTER — Telehealth: Payer: Medicaid Other | Admitting: Gynecologic Oncology

## 2024-01-24 ENCOUNTER — Emergency Department (HOSPITAL_COMMUNITY)
Admission: EM | Admit: 2024-01-24 | Discharge: 2024-01-24 | Disposition: A | Discharge status: Home / Self Care | Payer: 59 | Attending: Emergency Medicine | Admitting: Emergency Medicine

## 2024-01-24 ENCOUNTER — Other Ambulatory Visit: Payer: Self-pay

## 2024-01-24 DIAGNOSIS — Z91010 Allergy to peanuts: Secondary | ICD-10-CM | POA: Insufficient documentation

## 2024-01-24 DIAGNOSIS — M545 Low back pain, unspecified: Secondary | ICD-10-CM | POA: Insufficient documentation

## 2024-01-24 DIAGNOSIS — F17210 Nicotine dependence, cigarettes, uncomplicated: Secondary | ICD-10-CM | POA: Insufficient documentation

## 2024-01-24 MED ORDER — MELOXICAM 15 MG PO TABS: 15.0000 mg | ORAL_TABLET | Freq: Every day | ORAL | 0 refills | Status: DC | PRN

## 2024-01-24 MED ORDER — KETOROLAC TROMETHAMINE 30 MG/ML IJ SOLN
30.0000 mg | Freq: Once | INTRAMUSCULAR | Status: AC
  Administered 2024-01-24: 30 mg via INTRAMUSCULAR
  Filled 2024-01-24: qty 1

## 2024-01-24 MED ORDER — CYCLOBENZAPRINE HCL 10 MG PO TABS: 10.0000 mg | ORAL_TABLET | Freq: Two times a day (BID) | ORAL | 0 refills | Status: DC | PRN

## 2024-01-24 MED ORDER — LIDOCAINE 5 % EX PTCH
2.0000 | MEDICATED_PATCH | CUTANEOUS | Status: DC
  Administered 2024-01-24: 2 via TRANSDERMAL
  Filled 2024-01-24: qty 2

## 2024-01-24 MED ORDER — LIDOCAINE 5 % EX PTCH: 1.0000 | MEDICATED_PATCH | CUTANEOUS | 0 refills | Status: DC

## 2024-01-24 MED ORDER — CYCLOBENZAPRINE HCL 10 MG PO TABS
10.0000 mg | ORAL_TABLET | Freq: Once | ORAL | Status: AC
  Administered 2024-01-24: 10 mg via ORAL
  Filled 2024-01-24: qty 1

## 2024-01-24 NOTE — ED Provider Notes (Signed)
[place] EMERGENCY DEPARTMENT AT [facility] Provider Note   CSN: [id number] Arrival date & time: 01/24/24  1625     History  Chief Complaint  Patient presents with   Back Pain    Kristin Boyd is a 43 y.o. female.   Back Pain   43 year old female presents emergency department because of low back pain.  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  States that she works at [employer] and was bending over moving objects when her symptoms started..  Reports some radiation to bilateral hip.  Reports history of similar symptoms in the past and it was related to a muscular strain.  Denies any urinary symptoms, weakness/sensory deficits in lower extremities, saddle anesthesia, bowel/bladder dysfunction, history of IV drug use, prolonged corticosteroid use, known malignancy.  Denies abdominal pain, nausea, vomiting, fever.  Has tried topical Biofreeze which has helped some.  Presents emergency department for further assessment.  Past medical history significant for morbid obesity, endometriosis, adnexal mass  Home Medications Prior to Admission medications   Medication Sig Start Date End Date Taking? Authorizing Provider  amLODipine (NORVASC) 10 MG tablet TAKE 1 TABLET(10 MG) BY MOUTH DAILY 10/12/23   Carver Fila, MD  estrogens, conjugated, (PREMARIN) 0.625 MG tablet Take 1 tablet (0.625 mg total) by mouth daily. Take daily for 21 days then do not take for 7 days. 07/29/23   Hermina Staggers, MD  ibuprofen (ADVIL) 800 MG tablet Take 1 tablet (800 mg total) by mouth every 8 (eight) hours as needed for moderate pain. 07/29/23   Hermina Staggers, MD  meloxicam (MOBIC) 15 MG tablet TAKE 1 TABLET(15 MG) BY MOUTH DAILY 06/15/23   Louann Sjogren, DPM      Allergies    Peanut (diagnostic) and Penicillins    Review of Systems   Review of Systems  Musculoskeletal:  Positive for back pain.  All other systems reviewed and are  negative.   Physical Exam Updated Vital Signs BP (!) 150/100 (BP Location: Left Arm)   Pulse 66   Temp 98.2 F (36.8 C) (Oral)   Resp 16   SpO2 95%  Physical Exam Vitals and nursing note reviewed.  Constitutional:      General: She is not in acute distress.    Appearance: She is well-developed.  HENT:     Head: Normocephalic and atraumatic.  Eyes:     Conjunctiva/sclera: Conjunctivae normal.  Cardiovascular:     Rate and Rhythm: Normal rate and regular rhythm.     Pulses: Normal pulses.     Heart sounds: No murmur heard. Pulmonary:     Effort: Pulmonary effort is normal. No respiratory distress.     Breath sounds: Normal breath sounds.  Abdominal:     Palpations: Abdomen is soft.     Tenderness: There is no abdominal tenderness.  Musculoskeletal:        General: No swelling.     Cervical back: Neck supple.     Comments: No midline tenderness lumbar spine.  Paraspinal tenderness noted bilaterally lumbar spine.  Muscular strength 5 out of 5 lower extremities.  No sensory deficits along major nerve distributions of lower extremities.  Reflexes symmetric 2+ at patella/Achilles.  Pedal posterior tibial pulses 2+ bilaterally.  Skin:    General: Skin is warm and dry.     Capillary Refill: Capillary refill takes less than 2 seconds.  Neurological:     Mental Status: She is alert.  Psychiatric:        Mood and Affect: Mood normal.     ED Results / Procedures / Treatments   Labs (all labs ordered are listed, but only abnormal results are displayed) Labs Reviewed  URINALYSIS, ROUTINE W REFLEX MICROSCOPIC    EKG None  Radiology No results found.  Procedures Procedures    Medications Ordered in ED Medications  ketorolac (TORADOL) 30 MG/ML injection 30 mg (has no administration in time range)  cyclobenzaprine (FLEXERIL) tablet 10 mg (has no administration in time range)  lidocaine (LIDODERM) 5 % 2 patch (has no administration in time range)    ED Course/ Medical  Decision Making/ A&P Clinical Course as of 01/24/24 2003  Sun Jan 24, 2024  1927 UA not sufficient sample.  Patient attempting to give another urine sample. [CR]  2002 Patient's second urine sample not sufficient.  Offered patient continued oral hydration with UA sample but she declined.  Would prefer to manage symptoms at home with medications given while the emergency department.  Will plan for discharge with follow-up with PCP. [CR]    Clinical Course User Index [CR] Peter Garter, PA                                 Medical Decision Making Amount and/or Complexity of Data Reviewed Labs: ordered.  Risk Prescription drug management.   This patient presents to the ED for concern of back pain, this involves an extensive number of treatment options, and is a complaint that carries with it a high risk of complications and morbidity.  The differential diagnosis includes fracture, strain/pain, ligamentous/tendinous injury, cauda equina, spinal epidural abscess, other spinal cord compression/impingement, AAA, pyelonephritis, nephrolithiasis, other   Co morbidities that complicate the patient evaluation  See HPI   Additional history obtained:  Additional history obtained from EMR External records from outside source obtained and reviewed including hospital records   Lab Tests:  I Ordered, and personally interpreted labs.  The pertinent results include: UA unable to be obtained.   Imaging Studies ordered:  N/a   Cardiac Monitoring: / EKG:  The patient was maintained on a cardiac monitor.  I personally viewed and interpreted the cardiac monitored which showed an underlying rhythm of: sinus rhythm   Consultations Obtained:  N/a   Problem List / ED Course / Critical interventions / Medication management  Back pain I ordered medication including, Lidoderm, Flexeril   Reevaluation of the patient after these medicines showed that the patient improved I have reviewed  the patients home medicines and have made adjustments as needed   Social Determinants of Health:  Chronic cigarette use.  Denies illicit drug use.   Test / Admission - Considered:  Low back pain Vitals signs significant for hypertension. Otherwise within normal range and stable throughout visit. Laboratory studies significant for: See above 43 year old female presents emergency department with back pain.  Back pain began when she was trying to restock her working area bending over lifting objects.  On exam, paraspinal tenderness noted bilaterally in lumbar region.  No red flag signs for back pain on HPI/PE; low suspicion for cauda equina, spinal epidural abscess, other spinal cord compression/impingement.  Patient without abdominal pain rating to back, pulse deficits, neurodeficits; low suspicion for aortic dissection.  Patient without urinary symptoms or CVA tenderness; low suspicion for pyelonephritis/nephrolithiasis.  Treated with medication to no improvement of symptoms on the ED.  Suspect muscular cause  of symptoms.  Will treat patient's symptoms similar in the outpatient setting and recommend follow-up with PCP.  Treatment plan discussed length with patient and she acknowledged understanding was agreeable to said plan.  Patient overall well-appearing, afebrile in no acute distress. Worrisome signs and symptoms were discussed with the patient, and the patient acknowledged understanding to return to the ED if noticed. Patient was stable upon discharge.          Final Clinical Impression(s) / ED Diagnoses Final diagnoses:  None    Rx / DC Orders ED Discharge Orders     None         Peter Garter, [place] 01/24/24 2004    Vanetta Mulders, MD 01/27/24 1104

## 2024-01-24 NOTE — ED Triage Notes (Signed)
Pt c/o lower back pain that began yesterday. Pt states the pain goes across her entire back, denies any urinary symptoms or known injury. Pt BP is elevated in triage, pt states she has been prescribed meds, but is not currently taking them.

## 2024-01-24 NOTE — Discharge Instructions (Addendum)
As discussed, visit to the emergency department today overall reassuring.  Will treat your symptoms with continued NSAIDs in the outpatient setting, muscle laxer and numbing patch to use as needed.  Recommend follow-up with your primary care for reassessment.  Please do not hesitate to return if the worrisome signs and symptoms we discussed become apparent.

## 2024-02-29 ENCOUNTER — Encounter: Payer: Self-pay | Admitting: Gynecologic Oncology

## 2024-02-29 ENCOUNTER — Encounter (HOSPITAL_BASED_OUTPATIENT_CLINIC_OR_DEPARTMENT_OTHER): Payer: Self-pay | Admitting: Emergency Medicine

## 2024-02-29 ENCOUNTER — Other Ambulatory Visit: Payer: Self-pay

## 2024-02-29 ENCOUNTER — Emergency Department (HOSPITAL_BASED_OUTPATIENT_CLINIC_OR_DEPARTMENT_OTHER)
Admission: EM | Admit: 2024-02-29 | Discharge: 2024-03-01 | Disposition: A | Discharge status: Home / Self Care | Attending: Emergency Medicine | Admitting: Emergency Medicine

## 2024-02-29 DIAGNOSIS — I10 Essential (primary) hypertension: Secondary | ICD-10-CM | POA: Diagnosis not present

## 2024-02-29 DIAGNOSIS — Z91010 Allergy to peanuts: Secondary | ICD-10-CM | POA: Insufficient documentation

## 2024-02-29 DIAGNOSIS — Z79899 Other long term (current) drug therapy: Secondary | ICD-10-CM | POA: Insufficient documentation

## 2024-02-29 DIAGNOSIS — M545 Low back pain, unspecified: Secondary | ICD-10-CM | POA: Diagnosis present

## 2024-02-29 DIAGNOSIS — F17210 Nicotine dependence, cigarettes, uncomplicated: Secondary | ICD-10-CM | POA: Insufficient documentation

## 2024-02-29 HISTORY — DX: Endometriosis, unspecified: N80.9

## 2024-02-29 LAB — URINALYSIS, ROUTINE W REFLEX MICROSCOPIC
Bilirubin Urine: NEGATIVE
Glucose, UA: NEGATIVE mg/dL
Hgb urine dipstick: NEGATIVE
Ketones, ur: NEGATIVE mg/dL
Leukocytes,Ua: NEGATIVE
Nitrite: NEGATIVE
Protein, ur: 30 mg/dL — AB
Specific Gravity, Urine: >=1.03 (ref 1.005–1.030)
pH: 6 (ref 5.0–8.0)

## 2024-02-29 LAB — URINALYSIS, MICROSCOPIC (REFLEX)
RBC / HPF: NONE SEEN RBC/hpf (ref 0–5)
WBC, UA: NONE SEEN WBC/hpf (ref 0–5)

## 2024-02-29 MED ORDER — OXYCODONE-ACETAMINOPHEN 5-325 MG PO TABS
1.0000 | ORAL_TABLET | ORAL | Status: DC | PRN
  Administered 2024-02-29: 1 via ORAL
  Filled 2024-02-29: qty 1

## 2024-02-29 NOTE — ED Provider Notes (Signed)
 [facility] [facility] Provider Note  CSN: [id number] Arrival date & time: 02/29/24 1920  Chief Complaint(s) Back Pain  HPI [name] is a 43 y.o. female with past medical history as below, significant for chronic pelvic pain, chronic knee pain, endometriosis, obesity, arthritis who presents to the ED with complaint of low back pain  Symptoms began around lunchtime before going to work, while at work her back pain worsened she tried Motrin with some relief, no trauma, no fevers, no IV drug use, no recent spinal surgeries or injections.  No numbness or weakness to extremities.  No urinary overflow or incontinence, no saddle paresthesias.  No pain to her abdomen, no nausea or vomiting, change in bowel or bladder function.  No weakness to her legs.  Past Medical History Past Medical History:  Diagnosis Date   Arthritis    Chronic knee pain    Chronic pelvic pain in female    Endometriosis    Hypertension    Kidney stones    Obese    Ovarian cyst    Pneumonia due to COVID-19 virus 05/26/2019   Postoperative state 11/12/2021   Vaginal Pap smear, abnormal    Patient Active Problem List   Diagnosis Date Noted   Screening mammogram for breast cancer 07/29/2023   Encounter for monitoring Lupron therapy 07/29/2023   Fibroid, uterine 02/04/2023   Endometriosis 11/13/2021   Adnexal mass 10/23/2021   Morbid obesity with BMI of 50.0-59.9, adult (HCC) 06/06/2019   Home Medication(s) Prior to Admission medications   Medication Sig Start Date End Date Taking? Authorizing Provider  amLODipine (NORVASC) 10 MG tablet TAKE 1 TABLET(10 MG) BY MOUTH DAILY 10/12/23   Carver Fila, MD  cyclobenzaprine (FLEXERIL) 10 MG tablet Take 1 tablet (10 mg total) by mouth 2 (two) times daily as needed for muscle spasms. 01/24/24   Peter Garter, PA  estrogens, conjugated, (PREMARIN) 0.625 MG tablet Take 1 tablet (0.625 mg total) by mouth daily. Take daily for  21 days then do not take for 7 days. 07/29/23   Hermina Staggers, MD  lidocaine (LIDODERM) 5 % Place 1 patch onto the skin daily. Remove & Discard patch within 12 hours or as directed by MD 01/24/24   Peter Garter, PA  meloxicam (MOBIC) 15 MG tablet Take 1 tablet (15 mg total) by mouth daily as needed for pain. 01/24/24   Peter Garter, PA                                                                                                                                    Past Surgical History Past Surgical History:  Procedure Laterality Date   CESAREAN SECTION     CHOLECYSTECTOMY     DILATION AND CURETTAGE OF UTERUS N/A 11/12/2021   Procedure: ATTEMPTED HYSTEROSCOPY;  Surgeon: Carver Fila, MD;  Location: [facility];  Service: Gynecology;  Laterality:  N/A;   ENDOMETRIAL ABLATION     WISDOM TOOTH EXTRACTION     2 removed   XI ROBOTIC ASSISTED SALPINGECTOMY N/A 11/12/2021   Procedure: DIAGNOSTIC LAPAROSCOPY, PERITONEAL BIOPSIES;  Surgeon: Carver Fila, MD;  Location: [facility];  Service: Gynecology;  Laterality: N/A;   Family History Family History  Problem Relation Age of Onset   Hypertension Father    Diabetes Father    Colon cancer Neg Hx    Breast cancer Neg Hx    Ovarian cancer Neg Hx    Endometrial cancer Neg Hx    Pancreatic cancer Neg Hx    Prostate cancer Neg Hx     Social History Social History   Tobacco Use   Smoking status: Every Day    Types: Cigarettes   Smokeless tobacco: Never  Vaping Use   Vaping status: Never Used  Substance Use Topics   Alcohol use: Yes    Comment: occasional   Drug use: Never   Allergies Peanut (diagnostic) and Penicillins  Review of Systems A thorough review of systems was obtained and all systems are negative except as noted in the HPI and PMH.   Physical Exam Vital Signs  I have reviewed the triage vital signs BP (!) 182/108   Pulse 85   Temp 98.2 F (36.8 C)   Resp 20   Ht 5\' 9"  (1.753 m)   SpO2 100%   BMI  53.16 kg/m  Physical Exam Vitals and nursing note reviewed.  Constitutional:      General: She is not in acute distress.    Appearance: Normal appearance. She is well-developed. She is obese. She is not ill-appearing.  HENT:     Head: Normocephalic and atraumatic.     Right Ear: External ear normal.     Left Ear: External ear normal.     Nose: Nose normal.     Mouth/Throat:     Mouth: Mucous membranes are moist.  Eyes:     General: No scleral icterus.       Right eye: No discharge.        Left eye: No discharge.  Cardiovascular:     Rate and Rhythm: Normal rate.  Pulmonary:     Effort: Pulmonary effort is normal. No respiratory distress.     Breath sounds: No stridor.  Abdominal:     General: Abdomen is flat. There is no distension.     Palpations: Abdomen is soft.     Tenderness: There is no abdominal tenderness. There is no guarding.  Musculoskeletal:        General: No deformity.       Arms:     Cervical back: No rigidity.     Comments: Paraspinal muscles are TTP bilateral  No midline spinous process tenderness to palpation or percussion, no crepitus or step-off.    Skin:    General: Skin is warm and dry.     Coloration: Skin is not cyanotic, jaundiced or pale.  Neurological:     Mental Status: She is alert and oriented to person, place, and time.     GCS: GCS eye subscore is 4. GCS verbal subscore is 5. GCS motor subscore is 6.  Psychiatric:        Speech: Speech normal.        Behavior: Behavior normal. Behavior is cooperative.     ED Results and Treatments Labs (all labs ordered are listed, but only abnormal results are displayed) Labs Reviewed  URINALYSIS, ROUTINE W  REFLEX MICROSCOPIC - Abnormal; Notable for the following components:      Result Value   APPearance HAZY (*)    Protein, ur 30 (*)    All other components within normal limits  URINALYSIS, MICROSCOPIC (REFLEX) - Abnormal; Notable for the following components:   Bacteria, UA RARE (*)    All  other components within normal limits                                                                                                                          Radiology No results found.  Pertinent labs & imaging results that were available during my care of the patient were reviewed by me and considered in my medical decision making (see MDM for details).  Medications Ordered in ED Medications  lidocaine (LIDODERM) 5 % 2 patch (2 patches Transdermal Patch Applied 03/01/24 0024)  ketorolac (TORADOL) 30 MG/ML injection 30 mg (30 mg Intramuscular Given 03/01/24 0025)  cyclobenzaprine (FLEXERIL) tablet 5 mg (5 mg Oral Given 03/01/24 0023)  oxyCODONE (Oxy IR/ROXICODONE) immediate release tablet 5 mg (5 mg Oral Given 03/01/24 0023)  acetaminophen (TYLENOL) tablet 1,000 mg (1,000 mg Oral Given 03/01/24 0022)                                                                                                                                     Procedures Procedures  (including critical care time)  Medical Decision Making / ED Course    Medical Decision Making:    Kristin Boyd is a 43 y.o. female with past medical history as below, significant for chronic pelvic pain, chronic knee pain, endometriosis, obesity, arthritis who presents to the ED with complaint of low back pain. The complaint involves an extensive differential diagnosis and also carries with it a high risk of complications and morbidity.  Serious etiology was considered. Ddx includes but is not limited to: Differential diagnosis includes but is not exclusive to musculoskeletal back pain, renal colic, urinary tract infection, pyelonephritis, intra-abdominal causes of back pain, aortic aneurysm or dissection, cauda equina syndrome, sciatica, lumbar disc disease, thoracic disc disease, etc.   Complete initial physical exam performed, notably the patient was in no acute distress, HDS Reviewed and confirmed nursing documentation for past  medical history, family history, social history.  Vital signs reviewed.         Brief summary: 43 year old female with history as above  here with thoracic and low back pain.  No trauma.  Has gradually worsened throughout the day.  She tried Motrin at home which did provide some relief.  Pain just continue to worsen since then.  Sharp and stabbing pain to her back.  No numbness or weakness to her lower extremities.  No fevers.  Her exam is revealing for paraspinal TTP through her mid and low back.  No midline TTP.  Strength symmetric and sensation symmetric lower extremities.  She was given Percocet in triage which did provide some relief.  Give further analgesics and multimodal pain control.  On recheck she is feeling much better, gait steady, LE NVI.  Patient presents with low back pain without signs of spinal cord compression, cauda equina syndrome, infection, aneurysm, or other serious etiology. The patient is neurologically intact. Given the extremely low risk of these diagnoses further testing and evaluation for these possibilities does not appear to be indicated at this time. Detailed discussions were had with the patient and/or family and caregivers, regarding current findings, and need for close f/u with PCP or on call doctor. The patient has been instructed to return immediately if the symptoms worsen in any way. Patient verbalized understanding and is in agreement with current care plan. All questions answered prior to discharge.             Additional history obtained: -Additional history obtained from na -External records from outside source obtained and reviewed including: Chart review including previous notes, labs, imaging, consultation notes including  Prior ER evaluation, home medications, PDMP   Lab Tests: -I ordered, reviewed, and interpreted labs.   The pertinent results include:   Labs Reviewed  URINALYSIS, ROUTINE W REFLEX MICROSCOPIC - Abnormal; Notable for  the following components:      Result Value   APPearance HAZY (*)    Protein, ur 30 (*)    All other components within normal limits  URINALYSIS, MICROSCOPIC (REFLEX) - Abnormal; Notable for the following components:   Bacteria, UA RARE (*)    All other components within normal limits    Notable for urinalysis reassuring, urine pregnancy negative  EKG   EKG Interpretation Date/Time:    Ventricular Rate:    PR Interval:    QRS Duration:    QT Interval:    QTC Calculation:   R Axis:      Text Interpretation:           Imaging Studies ordered: na   Medicines ordered and prescription drug management: Meds ordered this encounter  Medications   DISCONTD: oxyCODONE-acetaminophen (PERCOCET/ROXICET) 5-325 MG per tablet 1 tablet    Refill:  0   lidocaine (LIDODERM) 5 % 2 patch   ketorolac (TORADOL) 30 MG/ML injection 30 mg   cyclobenzaprine (FLEXERIL) tablet 5 mg   oxyCODONE (Oxy IR/ROXICODONE) immediate release tablet 5 mg    Refill:  0   acetaminophen (TYLENOL) tablet 1,000 mg    -I have reviewed the patients home medicines and have made adjustments as needed   Consultations Obtained: na   Cardiac Monitoring: Continuous pulse oximetry interpreted by myself, 100% on RA.    Social Determinants of Health:  Diagnosis or treatment significantly limited by social determinants of health: current smoker and obesity   Reevaluation: After the interventions noted above, I reevaluated the patient and found that they have improved  Co morbidities that complicate the patient evaluation  Past Medical History:  Diagnosis Date   Arthritis    Chronic knee pain  Chronic pelvic pain in female    Endometriosis    Hypertension    Kidney stones    Obese    Ovarian cyst    Pneumonia due to COVID-19 virus 05/26/2019   Postoperative state 11/12/2021   Vaginal Pap smear, abnormal       Dispostion: Disposition decision including need for hospitalization was considered,  and patient discharged from emergency department.    Final Clinical Impression(s) / ED Diagnoses Final diagnoses:  None        Sloan Leiter, DO 03/01/24 0240

## 2024-02-29 NOTE — ED Triage Notes (Signed)
 PT POV steady gait- c/o lower back pain that radiates to BL sides, down legs starting today appx 1830.   Hx of endometriosis, ovarian cysts. Denies falls, known injury. Denies urinary sx.  Hx of kidney stones.   Ibuprofen taken earlier today, ineffective.

## 2024-03-01 MED ORDER — KETOROLAC TROMETHAMINE 30 MG/ML IJ SOLN
30.0000 mg | Freq: Once | INTRAMUSCULAR | Status: AC
  Administered 2024-03-01: 30 mg via INTRAMUSCULAR
  Filled 2024-03-01: qty 1

## 2024-03-01 MED ORDER — LIDOCAINE 5 % EX PTCH
2.0000 | MEDICATED_PATCH | Freq: Once | CUTANEOUS | Status: DC
  Administered 2024-03-01: 2 via TRANSDERMAL
  Filled 2024-03-01: qty 2

## 2024-03-01 MED ORDER — OXYCODONE HCL 5 MG PO TABS: 5.0000 mg | ORAL_TABLET | Freq: Four times a day (QID) | ORAL | 0 refills | Status: DC | PRN

## 2024-03-01 MED ORDER — OXYCODONE HCL 5 MG PO TABS
5.0000 mg | ORAL_TABLET | Freq: Once | ORAL | Status: AC
  Administered 2024-03-01: 5 mg via ORAL
  Filled 2024-03-01: qty 1

## 2024-03-01 MED ORDER — ACETAMINOPHEN 325 MG PO TABS: 650.0000 mg | ORAL_TABLET | Freq: Four times a day (QID) | ORAL | 0 refills | Status: DC | PRN

## 2024-03-01 MED ORDER — CYCLOBENZAPRINE HCL 10 MG PO TABS: 10.0000 mg | ORAL_TABLET | Freq: Two times a day (BID) | ORAL | 0 refills | Status: DC | PRN

## 2024-03-01 MED ORDER — CYCLOBENZAPRINE HCL 5 MG PO TABS
5.0000 mg | ORAL_TABLET | Freq: Once | ORAL | Status: AC
  Administered 2024-03-01: 5 mg via ORAL
  Filled 2024-03-01: qty 1

## 2024-03-01 MED ORDER — ACETAMINOPHEN 500 MG PO TABS
1000.0000 mg | ORAL_TABLET | Freq: Once | ORAL | Status: AC
  Administered 2024-03-01: 1000 mg via ORAL
  Filled 2024-03-01: qty 2

## 2024-03-01 MED ORDER — LIDOCAINE 5 % EX PTCH: 1.0000 | MEDICATED_PATCH | Freq: Every day | CUTANEOUS | 0 refills | Status: DC | PRN

## 2024-03-01 MED ORDER — IBUPROFEN 600 MG PO TABS: 600.0000 mg | ORAL_TABLET | Freq: Four times a day (QID) | ORAL | 0 refills | Status: DC | PRN

## 2024-03-01 NOTE — Discharge Instructions (Signed)
 Please follow-up with your Primary Care Physician within the next week. Please take your medications as instructed and discuss any changes to your medications with your primary care physician.    Please return to the Emergency Department if you have any leg numbness, leg weakness, difficulty walking, fevers, worsening of pain, lightheadedness, lose consciousness, severe abdominal pain, severe headache, difficulty urinating, or difficulty having a bowel movement.   Please return to the emergency department immediately for any new or concerning symptoms, or if you get worse.

## 2024-03-16 ENCOUNTER — Encounter: Payer: Self-pay | Admitting: Gynecologic Oncology

## 2024-04-04 ENCOUNTER — Encounter: Payer: Medicaid Other | Admitting: Internal Medicine

## 2024-04-09 ENCOUNTER — Encounter (HOSPITAL_BASED_OUTPATIENT_CLINIC_OR_DEPARTMENT_OTHER): Payer: Self-pay | Admitting: Emergency Medicine

## 2024-04-09 ENCOUNTER — Emergency Department (HOSPITAL_BASED_OUTPATIENT_CLINIC_OR_DEPARTMENT_OTHER)

## 2024-04-09 ENCOUNTER — Emergency Department (HOSPITAL_BASED_OUTPATIENT_CLINIC_OR_DEPARTMENT_OTHER)
Admission: EM | Admit: 2024-04-09 | Discharge: 2024-04-09 | Disposition: A | Discharge status: Home / Self Care | Attending: Emergency Medicine | Admitting: Emergency Medicine

## 2024-04-09 DIAGNOSIS — M545 Low back pain, unspecified: Secondary | ICD-10-CM | POA: Diagnosis present

## 2024-04-09 DIAGNOSIS — Z91010 Allergy to peanuts: Secondary | ICD-10-CM | POA: Insufficient documentation

## 2024-04-09 DIAGNOSIS — M5432 Sciatica, left side: Secondary | ICD-10-CM

## 2024-04-09 DIAGNOSIS — I10 Essential (primary) hypertension: Secondary | ICD-10-CM | POA: Diagnosis not present

## 2024-04-09 DIAGNOSIS — Z79899 Other long term (current) drug therapy: Secondary | ICD-10-CM | POA: Diagnosis not present

## 2024-04-09 DIAGNOSIS — M5442 Lumbago with sciatica, left side: Secondary | ICD-10-CM | POA: Diagnosis not present

## 2024-04-09 LAB — PREGNANCY, URINE: Preg Test, Ur: NEGATIVE

## 2024-04-09 MED ORDER — PREDNISONE 10 MG PO TABS: ORAL_TABLET | ORAL | 0 refills | Status: AC

## 2024-04-09 MED ORDER — KETOROLAC TROMETHAMINE 60 MG/2ML IM SOLN
30.0000 mg | Freq: Once | INTRAMUSCULAR | Status: AC
  Administered 2024-04-09: 30 mg via INTRAMUSCULAR
  Filled 2024-04-09: qty 2

## 2024-04-09 MED ORDER — OXYCODONE-ACETAMINOPHEN 5-325 MG PO TABS: 1.0000 | ORAL_TABLET | Freq: Four times a day (QID) | ORAL | 0 refills | Status: DC | PRN

## 2024-04-09 MED ORDER — CYCLOBENZAPRINE HCL 10 MG PO TABS: 10.0000 mg | ORAL_TABLET | Freq: Two times a day (BID) | ORAL | 0 refills | Status: DC | PRN

## 2024-04-09 MED ORDER — AMLODIPINE BESYLATE 5 MG PO TABS
10.0000 mg | ORAL_TABLET | Freq: Once | ORAL | Status: AC
  Administered 2024-04-09: 10 mg via ORAL
  Filled 2024-04-09: qty 2

## 2024-04-09 NOTE — Discharge Instructions (Addendum)
 Take the medications as prescribed.  Follow up with a primary care doctor or consider seeing a spine doctor for further evaluation.

## 2024-04-09 NOTE — ED Notes (Signed)
 Pt has lower back pain with a hx of abd pain with endometriosis, but recently it's been going from her lower back down both of her hamstrings. Bilateral weakness and pain, worse this AM, but going on for 2x days.

## 2024-04-09 NOTE — ED Notes (Signed)
 Pt sts she takes BP meds on occasion

## 2024-04-09 NOTE — ED Triage Notes (Signed)
 Pt c/o lower back pain; sts has had the pain for a while. But it has worsened over the last few days; denies injury; pain radiates down both hips

## 2024-04-09 NOTE — ED Provider Notes (Signed)
 3:19 PM Patient signed out to me by previous ED physician. Pt is a 43 yo female presenting for low back pain with some left sciatica. Previous visits with no imaging.   Lumbar xray pending Steroids and pain meds given.  Physical Exam  BP (!) 155/92   Pulse 91   Temp 97.9 F (36.6 C)   Resp (!) 24   SpO2 91%   Physical Exam  Procedures  Procedures  ED Course / MDM    Medical Decision Making Amount and/or Complexity of Data Reviewed Labs: ordered. Radiology: ordered.  Risk Prescription drug management.   Lumbar x-ray demonstrates degenerative changes only.  Symptoms likely a result of degeneration and sciatica.  Patient also recently diagnosed with endometriosis and kidney stones which do not help with the back pain.  Patient states her symptoms are improved after steroids and pain control at this time.  Prescription sent to pharmacy.  Follow-up recommended.  Neurovascular deficits on exam.  No signs or symptoms of transverse myelitis or cauda equina syndrome.  Patient safe for discharge.  Patient in no distress and overall condition improved here in the ED. Detailed discussions were had with the patient regarding current findings, and need for close f/u with PCP or on call doctor. The patient has been instructed to return immediately if the symptoms worsen in any way for re-evaluation. Patient verbalized understanding and is in agreement with current care plan. All questions answered prior to discharge.        Owen Blowers P, DO 04/09/24 [phone]

## 2024-04-09 NOTE — ED Provider Notes (Signed)
 [name] [facility] Provider Note   CSN: [id number] Arrival date & time: 04/09/24  1202     History  Chief Complaint  Patient presents with   Back Pain    Kristin Boyd is a 43 y.o. female.   Back Pain    Patient has a history of elevated BMI, hypertension.  She presents ED with complaints of low back pain.  Patient states she has had symptoms for a while now but over the last few days it has worsened.  She feels the pain shooting to her left hip.  She denies any numbness or weakness. Records indicate PT visits for back pain back in auguest and September.  Ed visits for back pain in feb and march  Home Medications Prior to Admission medications   Medication Sig Start Date End Date Taking? Authorizing Provider  cyclobenzaprine  (FLEXERIL ) 10 MG tablet Take 1 tablet (10 mg total) by mouth 2 (two) times daily as needed for muscle spasms. 04/09/24  Yes Trish Furl, MD  oxyCODONE -acetaminophen  (PERCOCET/ROXICET) 5-325 MG tablet Take 1 tablet by mouth every 6 (six) hours as needed for severe pain (pain score 7-10). 04/09/24  Yes Trish Furl, MD  predniSONE  (DELTASONE ) 10 MG tablet Take 5 tablets (50 mg total) by mouth daily with breakfast for 2 days, THEN 4 tablets (40 mg total) daily with breakfast for 2 days, THEN 3 tablets (30 mg total) daily with breakfast for 2 days, THEN 2 tablets (20 mg total) daily with breakfast for 2 days, THEN 1 tablet (10 mg total) daily with breakfast for 2 days. 04/09/24 04/19/24 Yes Trish Furl, MD  acetaminophen  (TYLENOL ) 325 MG tablet Take 2 tablets (650 mg total) by mouth every 6 (six) hours as needed. 03/01/24   Russella Courts A, DO  amLODipine  (NORVASC ) 10 MG tablet TAKE 1 TABLET(10 MG) BY MOUTH DAILY 10/12/23   Suzi Essex, MD  estrogens , conjugated, (PREMARIN ) 0.625 MG tablet Take 1 tablet (0.625 mg total) by mouth daily. Take daily for 21 days then do not take for 7 days. 07/29/23   Ervin, Michael L, MD  ibuprofen   (ADVIL ) 600 MG tablet Take 1 tablet (600 mg total) by mouth every 6 (six) hours as needed. 03/01/24   Teddi Favors, DO  lidocaine  (LIDODERM ) 5 % Place 1 patch onto the skin daily as needed. Remove & Discard patch within 12 hours or as directed by MD 03/01/24   Teddi Favors, DO  oxyCODONE  (ROXICODONE ) 5 MG immediate release tablet Take 1 tablet (5 mg total) by mouth every 6 (six) hours as needed for severe pain (pain score 7-10). 03/01/24   Russella Courts A, DO      Allergies    Peanut (diagnostic) and Penicillins    Review of Systems   Review of Systems  Musculoskeletal:  Positive for back pain.    Physical Exam Updated Vital Signs BP (!) 155/92   Pulse 91   Temp 97.9 F (36.6 C)   Resp (!) 24   SpO2 91%  Physical Exam Vitals and nursing note reviewed.  Constitutional:      General: She is not in acute distress.    Appearance: She is well-developed.  HENT:     Head: Normocephalic and atraumatic.     Right Ear: External ear normal.     Left Ear: External ear normal.  Eyes:     General: No scleral icterus.       Right eye: No discharge.  Left eye: No discharge.     Conjunctiva/sclera: Conjunctivae normal.  Neck:     Trachea: No tracheal deviation.  Cardiovascular:     Rate and Rhythm: Normal rate.  Pulmonary:     Effort: Pulmonary effort is normal. No respiratory distress.     Breath sounds: No stridor.  Abdominal:     General: There is no distension.  Musculoskeletal:        General: No swelling or deformity.     Cervical back: Neck supple.     Comments: No tenderness palpation lumbar spine, no straight leg raise,  Skin:    General: Skin is warm and dry.     Findings: No rash.  Neurological:     General: No focal deficit present.     Mental Status: She is alert. Mental status is at baseline.     Cranial Nerves: No dysarthria or facial asymmetry.     Motor: No seizure activity.     Comments: 5 out of 5 strength plantarflexion dorsiflexion, normal sensation      ED Results / Procedures / Treatments   Labs (all labs ordered are listed, but only abnormal results are displayed) Labs Reviewed  PREGNANCY, URINE    EKG None  Radiology No results found.  Procedures Procedures    Medications Ordered in ED Medications  ketorolac  (TORADOL ) injection 30 mg (30 mg Intramuscular Given 04/09/24 1417)  amLODipine  (NORVASC ) tablet 10 mg (10 mg Oral Given 04/09/24 1500)    ED Course/ Medical Decision Making/ A&P                                 Medical Decision Making Amount and/or Complexity of Data Reviewed Labs: ordered. Radiology: ordered.  Risk Prescription drug management.   Pt with recurrent low back pain.  Recent exacerbation.  Suspect component of sciatica.  No findings to suggest infection.  No acute neuro deficits.  Evaluation and diagnostic testing in the emergency department does not suggest an emergent condition requiring admission or immediate intervention beyond what has been performed at this time.  The patient is safe for discharge and has been instructed to return immediately for worsening symptoms, change in symptoms or any other concerns.         Final Clinical Impression(s) / ED Diagnoses Final diagnoses:  Sciatica of left side    Rx / DC Orders ED Discharge Orders          Ordered    oxyCODONE -acetaminophen  (PERCOCET/ROXICET) 5-325 MG tablet  Every 6 hours PRN        04/09/24 1513    predniSONE  (DELTASONE ) 10 MG tablet  Q breakfast        04/09/24 1513    cyclobenzaprine  (FLEXERIL ) 10 MG tablet  2 times daily PRN        04/09/24 1513              Trish Furl, MD 04/09/24 1514

## 2024-04-30 ENCOUNTER — Other Ambulatory Visit: Payer: Self-pay

## 2024-04-30 ENCOUNTER — Encounter (HOSPITAL_BASED_OUTPATIENT_CLINIC_OR_DEPARTMENT_OTHER): Payer: Self-pay

## 2024-04-30 ENCOUNTER — Emergency Department (HOSPITAL_BASED_OUTPATIENT_CLINIC_OR_DEPARTMENT_OTHER): Admission: EM | Admit: 2024-04-30 | Discharge: 2024-04-30 | Disposition: A | Discharge status: Home / Self Care

## 2024-04-30 DIAGNOSIS — Z91010 Allergy to peanuts: Secondary | ICD-10-CM | POA: Diagnosis not present

## 2024-04-30 DIAGNOSIS — M545 Low back pain, unspecified: Secondary | ICD-10-CM | POA: Insufficient documentation

## 2024-04-30 DIAGNOSIS — G8929 Other chronic pain: Secondary | ICD-10-CM

## 2024-04-30 MED ORDER — LIDOCAINE 5 % EX PTCH
1.0000 | MEDICATED_PATCH | Freq: Once | CUTANEOUS | Status: DC
  Administered 2024-04-30: 1 via TRANSDERMAL
  Filled 2024-04-30: qty 1

## 2024-04-30 MED ORDER — LIDOCAINE 5 % EX PTCH: 1.0000 | MEDICATED_PATCH | CUTANEOUS | 0 refills | Status: AC

## 2024-04-30 MED ORDER — METHOCARBAMOL 500 MG PO TABS: 500.0000 mg | ORAL_TABLET | Freq: Two times a day (BID) | ORAL | 0 refills | Status: AC | PRN

## 2024-04-30 NOTE — ED Provider Notes (Signed)
 [place] [facility] AT [facility] Provider Note   CSN: [id number] Arrival date & time: 04/30/24  1725     History  Chief Complaint  Patient presents with   Back Pain    Kristin Boyd is a 43 y.o. female.  Presents with low back pain.  Has had issues with low back pain for the past 2 to 3 years.  Symptoms flared up today.  No trauma, no constitutional symptoms, no weakness in lower extremities, no fevers.  No bowel or bladder dysfunction.  No saddle anesthesia.  No history of IV drug use.  sHe is not immunosuppressed.   Back Pain      Home Medications Prior to Admission medications   Medication Sig Start Date End Date Taking? Authorizing Provider  acetaminophen  (TYLENOL ) 325 MG tablet Take 2 tablets (650 mg total) by mouth every 6 (six) hours as needed. 03/01/24   Teddi Favors, DO  amLODipine  (NORVASC ) 10 MG tablet TAKE 1 TABLET(10 MG) BY MOUTH DAILY 10/12/23   Suzi Essex, MD  cyclobenzaprine  (FLEXERIL ) 10 MG tablet Take 1 tablet (10 mg total) by mouth 2 (two) times daily as needed for muscle spasms. 04/09/24   Trish Furl, MD  estrogens , conjugated, (PREMARIN ) 0.625 MG tablet Take 1 tablet (0.625 mg total) by mouth daily. Take daily for 21 days then do not take for 7 days. 07/29/23   Ervin, Michael L, MD  ibuprofen  (ADVIL ) 600 MG tablet Take 1 tablet (600 mg total) by mouth every 6 (six) hours as needed. 03/01/24   Teddi Favors, DO  lidocaine  (LIDODERM ) 5 % Place 1 patch onto the skin daily as needed. Remove & Discard patch within 12 hours or as directed by MD 03/01/24   Teddi Favors, DO  oxyCODONE  (ROXICODONE ) 5 MG immediate release tablet Take 1 tablet (5 mg total) by mouth every 6 (six) hours as needed for severe pain (pain score 7-10). 03/01/24   Russella Courts A, DO  oxyCODONE -acetaminophen  (PERCOCET/ROXICET) 5-325 MG tablet Take 1 tablet by mouth every 6 (six) hours as needed for severe pain (pain score 7-10). 04/09/24   Trish Furl, MD       Allergies    Peanut (diagnostic) and Penicillins    Review of Systems   Review of Systems  Musculoskeletal:  Positive for back pain.    Physical Exam Updated Vital Signs BP (!) 173/115   Pulse 87   Temp 97.9 F (36.6 C) (Oral)   Resp 18   SpO2 99%  Physical Exam Vitals and nursing note reviewed.  Constitutional:      Appearance: She is obese.  HENT:     Nose: Nose normal.     Mouth/Throat:     Mouth: Mucous membranes are moist.  Cardiovascular:     Rate and Rhythm: Normal rate.  Pulmonary:     Effort: Pulmonary effort is normal.  Abdominal:     General: Abdomen is flat. There is no distension.     Tenderness: There is no abdominal tenderness. There is no guarding or rebound.  Musculoskeletal:     Comments: 5 out of 5 plantarflexion, dorsiflexion.  Hip extension.  Normal sensation in lower extremities.  2+ pulses.  Neurological:     Mental Status: She is alert.  Psychiatric:        Mood and Affect: Mood normal.        Behavior: Behavior normal.     ED Results / Procedures / Treatments   Labs (all  labs ordered are listed, but only abnormal results are displayed) Labs Reviewed - No data to display  EKG None  Radiology No results found.  Procedures Procedures    Medications Ordered in ED Medications  lidocaine  (LIDODERM ) 5 % 1 patch (has no administration in time range)    ED Course/ Medical Decision Making/ A&P Clinical Course as of 04/30/24 1824  Sat Apr 30, 2024  1801 XR lumbar spine on 5/3 : "FINDINGS: Five non-rib-bearing lumbar vertebrae. Minimal anterior spur formation at multiple levels of the lumbar and lower thoracic spine. Mild facet degenerative changes throughout the lumbar spine. No fractures, pars defects or subluxations.   IMPRESSION: Mild degenerative changes " [TY]    Clinical Course User Index [TY] Rolinda Climes, DO                                 Medical Decision Making 43 year old female presents emergency  department with back pain.  Simile a chronic problem for her.  Vital signs are reassuring.  No red flags on history or physical.  Discussed pain regimen with Tylenol  alternate with ibuprofen .  Will give lidocaine  patch and Robaxin .  Discussed need to follow-up with primary doctor and physical therapy.  Patient voiced understanding.  Stable for discharge at this time.  Risk Prescription drug management.         Final Clinical Impression(s) / ED Diagnoses Final diagnoses:  None    Rx / DC Orders ED Discharge Orders     None         Rolinda Climes, DO 04/30/24 1824

## 2024-04-30 NOTE — Discharge Instructions (Addendum)
 As discussed take Tylenol  alternating with ibuprofen  with dosages as directed on the packaging.  We are prescribing lidocaine  patches and muscle relaxers that she can use as well.  Please use caution as muscle relaxers can cause you to be drowsy.  Do not drive or operate heavy machinery while using them.  Return if develop fevers, chills, severe pain, weakness in the legs, numbness in your perineum, have difficulty voiding or any urinary incontinence.  You may also return if develop any new or worsening symptoms that are concerning to you.  As discussed, you may benefit from physical therapy.  Please talk to your primary doctor regarding referral.

## 2024-04-30 NOTE — ED Triage Notes (Addendum)
 Pt reports lower back pain that shoots down both legs. Pain began again this morning but increased at work Denies injury. Recently seen in ED for similar symptoms

## 2024-07-07 ENCOUNTER — Encounter: Payer: Self-pay | Admitting: Gynecologic Oncology

## 2024-07-18 ENCOUNTER — Other Ambulatory Visit: Payer: Self-pay

## 2024-07-18 ENCOUNTER — Encounter (HOSPITAL_COMMUNITY): Payer: Self-pay

## 2024-07-18 ENCOUNTER — Emergency Department (HOSPITAL_COMMUNITY)
Admission: EM | Admit: 2024-07-18 | Discharge: 2024-07-19 | Disposition: A | Discharge status: Home / Self Care | Attending: Emergency Medicine | Admitting: Emergency Medicine

## 2024-07-18 DIAGNOSIS — Z91010 Allergy to peanuts: Secondary | ICD-10-CM | POA: Diagnosis not present

## 2024-07-18 DIAGNOSIS — N760 Acute vaginitis: Secondary | ICD-10-CM | POA: Insufficient documentation

## 2024-07-18 DIAGNOSIS — B9689 Other specified bacterial agents as the cause of diseases classified elsewhere: Secondary | ICD-10-CM | POA: Insufficient documentation

## 2024-07-18 DIAGNOSIS — N939 Abnormal uterine and vaginal bleeding, unspecified: Secondary | ICD-10-CM | POA: Diagnosis present

## 2024-07-18 LAB — CBC
HCT: 43.8 % (ref 36.0–46.0)
Hemoglobin: 13.9 g/dL (ref 12.0–15.0)
MCH: 26.4 pg (ref 26.0–34.0)
MCHC: 31.7 g/dL (ref 30.0–36.0)
MCV: 83.3 fL (ref 80.0–100.0)
Platelets: 377 K/uL (ref 150–400)
RBC: 5.26 MIL/uL — ABNORMAL HIGH (ref 3.87–5.11)
RDW: 15.7 % — ABNORMAL HIGH (ref 11.5–15.5)
WBC: 13.4 K/uL — ABNORMAL HIGH (ref 4.0–10.5)
nRBC: 0 % (ref 0.0–0.2)

## 2024-07-18 LAB — HCG, SERUM, QUALITATIVE: Preg, Serum: NEGATIVE

## 2024-07-18 NOTE — ED Triage Notes (Signed)
 Pt reports vaginal bleeding since Saturday. Pt was seen in ED at that time, but pt states that she is now passing clots and bleeding has increased.

## 2024-07-19 ENCOUNTER — Encounter: Admitting: Obstetrics and Gynecology

## 2024-07-19 LAB — GC/CHLAMYDIA PROBE AMP (~~LOC~~) NOT AT ARMC
Chlamydia: NEGATIVE
Comment: NEGATIVE
Comment: NORMAL
Neisseria Gonorrhea: NEGATIVE

## 2024-07-19 LAB — WET PREP, GENITAL
Sperm: NONE SEEN
Trich, Wet Prep: NONE SEEN
WBC, Wet Prep HPF POC: <10 (ref <10)
Yeast Wet Prep HPF POC: NONE SEEN

## 2024-07-19 MED ORDER — METRONIDAZOLE 500 MG PO TABS: 500.0000 mg | ORAL_TABLET | Freq: Two times a day (BID) | ORAL | 0 refills | Status: DC

## 2024-07-19 MED ORDER — METRONIDAZOLE 500 MG PO TABS
500.0000 mg | ORAL_TABLET | Freq: Once | ORAL | Status: AC
  Administered 2024-07-19 (×2): 500 mg via ORAL
  Filled 2024-07-19: qty 1

## 2024-07-19 MED ORDER — MEDROXYPROGESTERONE ACETATE 5 MG PO TABS: 5.0000 mg | ORAL_TABLET | Freq: Every day | ORAL | 0 refills | Status: DC

## 2024-07-19 NOTE — ED Notes (Addendum)
 Patient needs a ride home. Patient offered a bus pass at this time and offered to sit in the waiting room until bus arrives. Patient d/c'd with home care instructions and bus pass.

## 2024-07-19 NOTE — ED Provider Notes (Signed)
 [place] EMERGENCY DEPARTMENT AT [facility] Provider Note   CSN: [id number] Arrival date & time: 07/18/24  1759     Patient presents with: Vaginal Bleeding   Kristin Boyd is a 43 y.o. female.   The history is provided by the patient.  Vaginal Bleeding Quality:  Dark red and clots Severity:  Moderate Onset quality:  Gradual Duration:  5 days Timing:  Constant Progression:  Unchanged Chronicity:  New Context: spontaneously   Relieved by:  Nothing Worsened by:  Nothing Ineffective treatments:  None tried Associated symptoms: no fever   Scheduled to see GYN about this at 8 am today.       Prior to Admission medications   Medication Sig Start Date End Date Taking? Authorizing Provider  medroxyPROGESTERone  (PROVERA ) 5 MG tablet Take 1 tablet (5 mg total) by mouth daily. 07/19/24  Yes Destany Severns, MD  metroNIDAZOLE  (FLAGYL ) 500 MG tablet Take 1 tablet (500 mg total) by mouth 2 (two) times daily. 07/19/24  Yes Gwendola Hornaday, MD  acetaminophen  (TYLENOL ) 325 MG tablet Take 2 tablets (650 mg total) by mouth every 6 (six) hours as needed. 03/01/24   Elnor Jayson LABOR, DO  amLODipine  (NORVASC ) 10 MG tablet TAKE 1 TABLET(10 MG) BY MOUTH DAILY 10/12/23   Viktoria Comer SAUNDERS, MD  estrogens , conjugated, (PREMARIN ) 0.625 MG tablet Take 1 tablet (0.625 mg total) by mouth daily. Take daily for 21 days then do not take for 7 days. 07/29/23   Ervin, Michael L, MD  ibuprofen  (ADVIL ) 600 MG tablet Take 1 tablet (600 mg total) by mouth every 6 (six) hours as needed. 03/01/24   Elnor Jayson LABOR, DO  methocarbamol  (ROBAXIN ) 500 MG tablet Take 1 tablet (500 mg total) by mouth 2 (two) times daily as needed for muscle spasms. 04/30/24   Neysa Caron PARAS, DO  oxyCODONE  (ROXICODONE ) 5 MG immediate release tablet Take 1 tablet (5 mg total) by mouth every 6 (six) hours as needed for severe pain (pain score 7-10). 03/01/24   Elnor Jayson A, DO  oxyCODONE -acetaminophen  (PERCOCET/ROXICET) 5-325 MG  tablet Take 1 tablet by mouth every 6 (six) hours as needed for severe pain (pain score 7-10). 04/09/24   Randol Simmonds, MD    Allergies: Peanut (diagnostic) and Penicillins    Review of Systems  Constitutional:  Negative for fever.  HENT:  Negative for facial swelling.   Genitourinary:  Positive for vaginal bleeding.  All other systems reviewed and are negative.   Updated Vital Signs BP (!) 158/91   Pulse 96   Temp 98.5 F (36.9 C) (Oral)   Resp 18   Ht 5' 9 (1.753 m)   Wt (!) 163.3 kg   SpO2 99%   BMI 53.16 kg/m   Physical Exam Vitals and nursing note reviewed.  Constitutional:      General: She is not in acute distress.    Appearance: Normal appearance. She is well-developed.  HENT:     Head: Normocephalic and atraumatic.     Nose: Nose normal.  Eyes:     Pupils: Pupils are equal, round, and reactive to light.  Cardiovascular:     Rate and Rhythm: Normal rate and regular rhythm.     Pulses: Normal pulses.     Heart sounds: Normal heart sounds.  Pulmonary:     Effort: Pulmonary effort is normal. No respiratory distress.     Breath sounds: Normal breath sounds.  Abdominal:     General: Bowel sounds are normal. There is no  distension.     Palpations: Abdomen is soft.     Tenderness: There is no abdominal tenderness. There is no guarding or rebound.  Musculoskeletal:        General: Normal range of motion.     Cervical back: Normal range of motion and neck supple.  Skin:    General: Skin is dry.     Capillary Refill: Capillary refill takes less than 2 seconds.     Findings: No erythema or rash.  Neurological:     General: No focal deficit present.     Mental Status: She is alert.     Deep Tendon Reflexes: Reflexes normal.  Psychiatric:        Mood and Affect: Mood normal.     (all labs ordered are listed, but only abnormal results are displayed) Results for orders placed or performed during the hospital encounter of 07/18/24  hCG, serum, qualitative    Collection Time: 07/18/24  6:34 PM  Result Value Ref Range   Preg, Serum NEGATIVE NEGATIVE  CBC   Collection Time: 07/18/24  6:34 PM  Result Value Ref Range   WBC 13.4 (H) 4.0 - 10.5 K/uL   RBC 5.26 (H) 3.87 - 5.11 MIL/uL   Hemoglobin 13.9 12.0 - 15.0 g/dL   HCT 56.1 63.9 - 53.9 %   MCV 83.3 80.0 - 100.0 fL   MCH 26.4 26.0 - 34.0 pg   MCHC 31.7 30.0 - 36.0 g/dL   RDW 84.2 (H) 88.4 - 84.4 %   Platelets 377 150 - 400 K/uL   nRBC 0.0 0.0 - 0.2 %  Wet prep, genital   Collection Time: 07/19/24  2:42 AM  Result Value Ref Range   Yeast Wet Prep HPF POC NONE SEEN NONE SEEN   Trich, Wet Prep NONE SEEN NONE SEEN   Clue Cells Wet Prep HPF POC PRESENT (A) NONE SEEN   WBC, Wet Prep HPF POC <10 <10   Sperm NONE SEEN    No results found.  Radiology: No results found.   Procedures   Medications Ordered in the ED  metroNIDAZOLE  (FLAGYL ) tablet 500 mg (has no administration in time range)                                    Medical Decision Making Patient having vaginal bleeding x 5 days   Amount and/or Complexity of Data Reviewed External Data Reviewed: notes.    Details: Previous notes reviewed  Labs: ordered.    Details: Clue cells on wet prep.  Normal white count, hemoglobin is normal 13.9, normal platelets.  Not pregnant   Risk Prescription drug management. Risk Details: Will start flagyl  for BV and provera  to slow bleeding. Follow up with your GYN as previously scheduled this am.  Stable for discharge.,       Final diagnoses:  Abnormal uterine bleeding (AUB)  Bacterial vaginosis   nontoxic-appearing on exam and vital signs are within normal limits.  I have reviewed the triage vital signs and the nursing notes. Pertinent labs & imaging results that were available during my care of the patient were reviewed by me and considered in my medical decision making (see chart for details). After history, exam, and medical workup I feel the patient has been appropriately  medically screened and is safe for discharge home. Pertinent diagnoses were discussed with the patient. Patient was given return precautions.  ED Discharge Orders          Ordered    medroxyPROGESTERone  (PROVERA ) 5 MG tablet  Daily        07/19/24 0305    metroNIDAZOLE  (FLAGYL ) 500 MG tablet  2 times daily        07/19/24 0305               Jayshaun Phillips, MD 07/19/24 9671

## 2024-07-29 NOTE — Progress Notes (Signed)
 Subjective   HPI:  Kristin Boyd is a 43 y.o.  female who presents to the office with:  Chief Complaint  Patient presents with  . Abdominal Pain    Patient presents today with concerns of abdominal pain. Patient saw spine specialist Monday of last week for injections. Notified provider she was bleeding with abdominal pain. Medication given to stop bleeding. Bleeding started again 2 nights ago with intense abdominal pain. Patient states she had ablation in 2014.    Patient presents for concerns of abdominal pain and vaginal bleeding. Symptoms started a few weeks ago. She was seen in ER 07/18/24 and note reviewed. She was discharged with a course of medroxyprogesterone  which stopped the bleeding. Bleeding and abdominal pain then resumed 2 nights ago. Blood is dark red, bleeding heavy with few clots. Using about 4 heavy flow pads per day. She has an upcoming appt with OBGYN 08/01/24. Hx of endometrial ablation in 2014, endometriosis.    PMH: Past medical history, Past surgical history, Social history, family history were reviewed as noted in EMR.  Pertinent for:  Past Medical History:  Diagnosis Date  . Back pain   . Hypertension      Medications and allergies reviewed.  ROS: All systems were negative including constitutional, CV, respiratory, GI/GU except for those noted in the HPI.  Objective   Vital Signs: BP 130/84 (BP Location: Left Upper Arm, Patient Position: Sitting)   Pulse 95   Temp 97 F (36.1 C) (Temporal)   Resp 16   Ht 5' 9 (1.753 m)   Wt (!) 349 lb 12.8 oz (158.7 kg)   LMP  (LMP Unknown)   SpO2 97%   BMI 51.66 kg/m   Wt Readings from Last 3 Encounters:  07/29/24 (!) 349 lb 12.8 oz (158.7 kg)  07/12/24 (!) 365 lb 3.2 oz (165.7 kg)  06/14/24 (!) 360 lb 12.8 oz (163.7 kg)   Physical Exam Constitutional:      General: She is not in acute distress.    Appearance: Normal appearance. She is obese.  Cardiovascular:     Rate and Rhythm: Normal rate and regular  rhythm.     Heart sounds: Normal heart sounds. No murmur heard.    No gallop.  Pulmonary:     Effort: Pulmonary effort is normal. No respiratory distress.     Breath sounds: Normal breath sounds. No wheezing, rhonchi or rales.  Abdominal:     General: There is no distension.     Palpations: Abdomen is soft. There is no mass.     Tenderness: There is no abdominal tenderness. There is no guarding.  Skin:    General: Skin is warm and dry.  Neurological:     Mental Status: She is alert. Mental status is at baseline.  Psychiatric:        Mood and Affect: Mood normal.    US  Pelvis Transvaginal w Doppler Results 07/17/24 Impression 1. Heterogeneous complex cystic lesion anterior and to the right of the urinary bladder, measuring 9.1 x 7.9 x 8.3 cm. This was favored to be an endometrioma on recent pelvic MRI. 2. Hyperechoic endometrial thickening measuring up to 18.5 mm. 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 3. Trace free fluid.   Assessment/Plan   Orders Placed This Encounter  Procedures  . POCT Hemoglobin    1. Abnormal uterine bleeding     Patient without acute signs of distress. Vitals hemodynamically stable. POC hgb normal. Rx 800 mg ibuprofen  and recommended scheduled  dosing to help decrease pain and heaviness of bleeding. Stressed importance of keeping upcoming OBGYN appointment on Monday. ER precautions given.   No follow-ups on file.  No future appointments.  Medications at end of visit today: Current Medications[1]  Patient Care Team: Chianne Kline, PA-C as PCP - General (Family Medicine)          [1]  Current Outpatient Medications:  .  amLODIPine  besylate (NORVASC ) 10 mg tablet, Take one tablet (10 mg dose) by mouth daily., Disp: 90 tablet, Rfl: 1 .  baclofen (LIORESAL) 10 mg tablet, Take one tablet (10 mg dose) by mouth 3 (three) times a day as needed (back pain)., Disp: 30 tablet, Rfl: 0 .  ibuprofen  (ADVIL ,MOTRIN ) 800  mg tablet, Take one tablet (800 mg dose) by mouth every 8 (eight) hours as needed for Pain., Disp: 30 tablet, Rfl: 1 .  medroxyPROGESTERone  (PROVERA ) 5 MG tablet, Take one tablet (5 mg dose) by mouth daily., Disp: , Rfl:

## 2024-08-01 ENCOUNTER — Encounter: Admitting: Obstetrics and Gynecology

## 2024-08-12 ENCOUNTER — Ambulatory Visit (INDEPENDENT_AMBULATORY_CARE_PROVIDER_SITE_OTHER): Admitting: Obstetrics

## 2024-08-12 ENCOUNTER — Other Ambulatory Visit (HOSPITAL_COMMUNITY)
Admission: RE | Admit: 2024-08-12 | Discharge: 2024-08-12 | Disposition: A | Discharge status: Home / Self Care | Source: Ambulatory Visit | Attending: Obstetrics | Admitting: Obstetrics

## 2024-08-12 ENCOUNTER — Telehealth: Payer: Self-pay | Admitting: *Deleted

## 2024-08-12 ENCOUNTER — Encounter: Payer: Self-pay | Admitting: Obstetrics

## 2024-08-12 VITALS — BP 134/92 | HR 87 | Ht 69.0 in | Wt 353.6 lb

## 2024-08-12 DIAGNOSIS — N9489 Other specified conditions associated with female genital organs and menstrual cycle: Secondary | ICD-10-CM

## 2024-08-12 DIAGNOSIS — N809 Endometriosis, unspecified: Secondary | ICD-10-CM | POA: Diagnosis not present

## 2024-08-12 DIAGNOSIS — Z6841 Body Mass Index (BMI) 40.0 and over, adult: Secondary | ICD-10-CM

## 2024-08-12 DIAGNOSIS — F17200 Nicotine dependence, unspecified, uncomplicated: Secondary | ICD-10-CM

## 2024-08-12 DIAGNOSIS — I10 Essential (primary) hypertension: Secondary | ICD-10-CM

## 2024-08-12 DIAGNOSIS — Z01419 Encounter for gynecological examination (general) (routine) without abnormal findings: Secondary | ICD-10-CM | POA: Insufficient documentation

## 2024-08-12 NOTE — Telephone Encounter (Signed)
 Per [name] APP patient scheduled for a telephone visit on 9/18

## 2024-08-12 NOTE — Progress Notes (Signed)
 Patient ID: Kristin Boyd, female   DOB: 05/26/1981, 43 y.o.   MRN: [id number]  HPI Kristin Boyd is a 43 y.o. female.  No complaints.  Has a history of chronic pelvic pain and laparoscopic diagnosis of endometriosis, and is followed by Dr. Comer Dollar, Gyn Oncology at [facility].for an adnexal mass, probable endometrioma. HPI  Past Medical History:  Diagnosis Date   Arthritis    Chronic knee pain    Chronic pelvic pain in female    Endometriosis    Hypertension    Kidney stones    Obese    Ovarian cyst    Pneumonia due to COVID-19 virus 05/26/2019   Postoperative state 11/12/2021   Vaginal Pap smear, abnormal     Past Surgical History:  Procedure Laterality Date   CESAREAN SECTION     CHOLECYSTECTOMY     DILATION AND CURETTAGE OF UTERUS N/A 11/12/2021   Procedure: ATTEMPTED HYSTEROSCOPY;  Surgeon: Dollar Comer SAUNDERS, MD;  Location: [facility];  Service: Gynecology;  Laterality: N/A;   ENDOMETRIAL ABLATION     WISDOM TOOTH EXTRACTION     2 removed   XI ROBOTIC ASSISTED SALPINGECTOMY N/A 11/12/2021   Procedure: DIAGNOSTIC LAPAROSCOPY, PERITONEAL BIOPSIES;  Surgeon: Dollar Comer SAUNDERS, MD;  Location: [facility];  Service: Gynecology;  Laterality: N/A;    Family History  Problem Relation Age of Onset   Hypertension Father    Diabetes Father    Colon cancer Neg Hx    Breast cancer Neg Hx    Ovarian cancer Neg Hx    Endometrial cancer Neg Hx    Pancreatic cancer Neg Hx    Prostate cancer Neg Hx     Social History Social History   Tobacco Use   Smoking status: Every Day    Types: Cigarettes   Smokeless tobacco: Never  Vaping Use   Vaping status: Never Used  Substance Use Topics   Alcohol use: Yes    Comment: occasional   Drug use: Never    Allergies  Allergen Reactions   Peanut (Diagnostic) Anaphylaxis   Penicillins Anaphylaxis    Has patient had a PCN reaction causing immediate rash, facial/tongue/throat swelling, SOB or lightheadedness with hypotension:  Yes Has patient had a PCN reaction causing severe rash involving mucus membranes or skin necrosis: Unknown Has patient had a PCN reaction that required hospitalization: Yes Has patient had a PCN reaction occurring within the last 10 years: Yes If all of the above answers are NO, then may proceed with Cephalosporin use.    Current Outpatient Medications  Medication Sig Dispense Refill   amLODipine  (NORVASC ) 10 MG tablet TAKE 1 TABLET(10 MG) BY MOUTH DAILY 30 tablet 0   acetaminophen  (TYLENOL ) 325 MG tablet Take 2 tablets (650 mg total) by mouth every 6 (six) hours as needed. (Patient not taking: Reported on 08/12/2024) 36 tablet 0   estrogens , conjugated, (PREMARIN ) 0.625 MG tablet Take 1 tablet (0.625 mg total) by mouth daily. Take daily for 21 days then do not take for 7 days. (Patient not taking: Reported on 08/12/2024) 30 tablet 5   ibuprofen  (ADVIL ) 600 MG tablet Take 1 tablet (600 mg total) by mouth every 6 (six) hours as needed. (Patient not taking: Reported on 08/12/2024) 30 tablet 0   medroxyPROGESTERone  (PROVERA ) 5 MG tablet Take 1 tablet (5 mg total) by mouth daily. (Patient not taking: Reported on 08/12/2024) 5 tablet 0   methocarbamol  (ROBAXIN ) 500 MG tablet Take 1 tablet (500 mg total) by mouth 2 (two) times  daily as needed for muscle spasms. (Patient not taking: Reported on 08/12/2024) 14 tablet 0   metroNIDAZOLE  (FLAGYL ) 500 MG tablet Take 1 tablet (500 mg total) by mouth 2 (two) times daily. (Patient not taking: Reported on 08/12/2024) 14 tablet 0   oxyCODONE  (ROXICODONE ) 5 MG immediate release tablet Take 1 tablet (5 mg total) by mouth every 6 (six) hours as needed for severe pain (pain score 7-10). (Patient not taking: Reported on 08/12/2024) 5 tablet 0   oxyCODONE -acetaminophen  (PERCOCET/ROXICET) 5-325 MG tablet Take 1 tablet by mouth every 6 (six) hours as needed for severe pain (pain score 7-10). (Patient not taking: Reported on 08/12/2024) 10 tablet 0   No current facility-administered  medications for this visit.    Review of Systems Review of Systems Constitutional: negative for fatigue and weight loss Respiratory: negative for cough and wheezing Cardiovascular: negative for chest pain, fatigue and palpitations Gastrointestinal: negative for abdominal pain and change in bowel habits Genitourinary:negative Integument/breast: negative for nipple discharge Musculoskeletal:negative for myalgias Neurological: negative for gait problems and tremors Behavioral/Psych: negative for abusive relationship, depression Endocrine: negative for temperature intolerance      Blood pressure (!) 134/92, pulse 87, height 5' 9 (1.753 m), weight (!) 353 lb 9.6 oz (160.4 kg).  Physical Exam Physical Exam General:   Alert and no distress  Skin:   no rash or abnormalities  Lungs:   clear to auscultation bilaterally  Heart:   regular rate and rhythm, S1, S2 normal, no murmur, click, rub or gallop  Breasts:   normal without suspicious masses, skin or nipple changes or axillary nodes  Abdomen:  normal findings: no organomegaly, soft, non-tender and no hernia  Pelvis:  External genitalia: normal general appearance Urinary system: urethral meatus normal and bladder without fullness, nontender Vaginal: normal without tenderness, induration or masses Cervix: normal appearance Adnexa: normal bimanual exam Uterus: anteverted and non-tender, normal size    I have spent a total of 30 minutes of face-to-face time, excluding clinical staff time, reviewing notes and preparing to see patient, ordering tests and/or medications, and counseling the patient.   Data Reviewed   US  PELVIC COMPLETE WITH TRANSVAGINAL (Accession [id number]) (Order [id number]) Imaging Date: 08/03/2023 Department: [facility]-ULTR[facility] Released By: Davee Johnnie FALCON Authorizing: Viktoria Comer SAUNDERS, MD   Exam Status  Status  Final [99]   PACS Intelerad Image Link   Show images for US  PELVIC COMPLETE  WITH TRANSVAGINAL Study Result  Narrative & Impression  CLINICAL DATA:  Adnexal mass.   EXAM: TRANSABDOMINAL AND TRANSVAGINAL ULTRASOUND OF PELVIS   TECHNIQUE: Both transabdominal and transvaginal ultrasound examinations of the pelvis were performed. Transabdominal technique was performed for global imaging of the pelvis including uterus, ovaries, adnexal regions, and pelvic cul-de-sac. It was necessary to proceed with endovaginal exam following the transabdominal exam to visualize the endometrium and adnexa.   COMPARISON:  Ultrasound 01/20/2023.  CT scan October 2023   FINDINGS: Uterus   Measurements: 12.0 x 5.4 x 7.1 cm = volume: 240.9 mL. No discrete mass lesion. Heterogeneous myometrium.   Endometrium   Thickness: 9 mm.  No focal abnormality visualized.   Right ovary   Not seen.  Overlapping bowel gas and soft tissue.   Left ovary   Measurements: 7.3 x 6.7 x 6.9 cm = volume: 337.5 mL. Associated with the ovary is a cystic lesion measuring 6.6 x 4.2 x 6.6 cm. Previously this lesion measures 7.7 x 6.9 x 8.6 cm.   Other findings   No abnormal  free fluid.   IMPRESSION: Right ovary is not seen with overlapping bowel gas and soft tissue. In addition of the transabdominal images are somewhat limited by overlapping bowel gas and soft tissue.   Persistent mildly complex left-sided ovarian cystic lesion measuring today up to 6.6 cm and previously 8.6 cm. Again there is a differential of benign and malignant etiologies. Recommend follow-up US  in 3-6 months. Note: This recommendation does not apply to premenarchal patients or to those with increased risk (genetic, family history, elevated tumor markers or other high-risk factors) of ovarian cancer. Reference: Radiology 2019 Nov; 293(2):359-371.   No free fluid in the pelvis.     Electronically Signed   By: Ranell Bring M.D.   On: 08/09/2023 11:55      Result History  US  PELVIC COMPLETE WITH TRANSVAGINAL  (Order #[id number]) on 08/09/2023 - Order Result History Report    US  PELVIC COMPLETE WITH TRANSVAGINAL: Result Notes   Comer JONELLE Dollar, MD 08/12/2023  5:29 PM EDT      Please schedule this patient for follow-up ultrasound in 4 months followed by phone visit with me. Thank you        MyChart Results Release  MyChart Status: Active  Results Release   Encounter-Level Documents on 08/03/2023:  Electronic signature on 08/03/2023 11:16 AM - E-signed      Order-Level Documents:  There are no order-level documents.   Hospital Account-Level Documents:  There are no hospital account-level documents. Vitals  Height Weight BMI (Calculated)  5' 9 (1.753 m) 360 lb (163.3 kg) 53.14   Imaging  Imaging Information    US  PELVIC COMPLETE WITH TRANSVAGINAL: Patient Communication   Add MyChart Message   Seen   Great news - cyst looks smaller on the ultrasound. We will repeat one in 3-6 months. I will also have the office schedule a follow-up with me by phone after this ultrasound  Written by Comer JONELLE Dollar, MD on 08/12/2023  5:29 PM EDT Seen by patient [name] on 06/21/2024  1:56 AM Resulted by:  Signed Date/Time  Phone Pager  GUPTA, ASHOK K 08/09/2023 11:55 AM     Link to IR Documentation Timeline  Sedation    US  PELVIC COMPLETE WITH TRANSVAGINAL: Result Notes   Comer JONELLE Dollar, MD 08/12/2023  5:29 PM EDT     Please schedule this patient for follow-up ultrasound in 4 months followed by phone visit with me. Thank you        Reference Links        Study Notes   Candie Waddell HERO, RDMS on 08/03/2023 11:26 AM  Patient checked in, but said she is leaving and will come back closer to apt time. Asked front desk to let us  know when she comes back   Original Order  Ordered On Ordered By   07/03/2023  4:34 PM Dollar Comer JONELLE, MD         Order History    Parent Order ID Child Order ID  [id number]    External Result Report  External  Result Report   Existing Charges  Charge Line Charge Code Status Charge Trigger Trigger Date Charge Type  [id number] [facility] US  Pelvis Comp [[id number]] [phone] [facility] Billing Imaging end exam 08/03/2023 Technical  [id number] [facility] US  Transvaginal Non OB [[id number]] [address] Billing Imaging end exam 08/03/2023 Technical  [id number] US  Pelvic Nonobstetric Real-Time Image Complete [address] [facility]  - Radiology 3rd Party Billing Imaging result study 08/09/2023 Professional  [id number] US  Transvaginal 23169 Filed  - Radiology 3rd Party Billing Imaging result study 08/09/2023 Professional  [id number] US  Transvaginal [address] [facility]  - Radiology 3rd Party Billing Imaging result study 08/09/2023 Professional    Assessment     1. Encounter for gynecological examination with Papanicolaou smear of cervix (Primary) Rx: - Cytology - PAP( [place])  2. Endometriosis - followed ny Dr. Viktoria  3. Adnexal mass - same as above.  Appointment scheduled for follow up after pelvic ultrasound  4. Morbid obesity with BMI of 50.0-59.9, adult (HCC) - weight reduction with the aid of dietary changes, exercise and behavioral modification recommended  5. Tobacco dependency - cessation recommended  6. HTN (hypertension), benign - BP mildly elevated - managed by PCP     Plan  Follow up prn Please keep follow up appointment with Dr. Viktoria, Gyn Oncology   CARLIN RONAL CENTERS, MD, FACOG Attending Obstetrician & Gynecologist, [facility] for [facility], [facility], [place] 08/12/2024

## 2024-08-12 NOTE — Progress Notes (Signed)
 Pt presents for vaginal bleeding with ablation.pt states that the bleeding stopped a few weeks ago. Pt has no questions or concerns at this time.

## 2024-08-16 LAB — CYTOLOGY - PAP
Adequacy: ABSENT
Comment: NEGATIVE
Diagnosis: NEGATIVE
High risk HPV: NEGATIVE

## 2024-08-16 NOTE — ED Triage Notes (Signed)
 PT arrives from [facility] from home c/o generalized lower abd pain that woke her up out nof her sleep at 1530 today, continued vaginal bleeding for months. PT reports changing pads every 2 hours and states she wears 2 pads at a time. PT denies N/V/D fevers chills, urinary symptoms.  HX endometriosis, cyst to L ovary

## 2024-08-17 ENCOUNTER — Ambulatory Visit: Payer: Self-pay | Admitting: Family Medicine

## 2024-08-19 ENCOUNTER — Telehealth: Payer: Self-pay | Admitting: *Deleted

## 2024-08-19 ENCOUNTER — Encounter (HOSPITAL_COMMUNITY): Payer: Self-pay

## 2024-08-19 ENCOUNTER — Emergency Department (HOSPITAL_COMMUNITY): Admission: EM | Admit: 2024-08-19 | Discharge: 2024-08-19 | Disposition: A | Discharge status: Home / Self Care

## 2024-08-19 ENCOUNTER — Other Ambulatory Visit: Payer: Self-pay

## 2024-08-19 DIAGNOSIS — R1030 Lower abdominal pain, unspecified: Secondary | ICD-10-CM | POA: Diagnosis not present

## 2024-08-19 DIAGNOSIS — Z79899 Other long term (current) drug therapy: Secondary | ICD-10-CM | POA: Diagnosis not present

## 2024-08-19 DIAGNOSIS — N939 Abnormal uterine and vaginal bleeding, unspecified: Secondary | ICD-10-CM | POA: Insufficient documentation

## 2024-08-19 DIAGNOSIS — Z91010 Allergy to peanuts: Secondary | ICD-10-CM | POA: Diagnosis not present

## 2024-08-19 DIAGNOSIS — R030 Elevated blood-pressure reading, without diagnosis of hypertension: Secondary | ICD-10-CM | POA: Diagnosis not present

## 2024-08-19 DIAGNOSIS — R102 Pelvic and perineal pain: Secondary | ICD-10-CM | POA: Insufficient documentation

## 2024-08-19 DIAGNOSIS — G8929 Other chronic pain: Secondary | ICD-10-CM | POA: Diagnosis not present

## 2024-08-19 DIAGNOSIS — M545 Low back pain, unspecified: Secondary | ICD-10-CM | POA: Diagnosis not present

## 2024-08-19 LAB — CBC WITH DIFFERENTIAL/PLATELET
Abs Immature Granulocytes: 0.02 K/uL (ref 0.00–0.07)
Basophils Absolute: 0.1 K/uL (ref 0.0–0.1)
Basophils Relative: 1 %
Eosinophils Absolute: 0.1 K/uL (ref 0.0–0.5)
Eosinophils Relative: 2 %
HCT: 41.5 % (ref 36.0–46.0)
Hemoglobin: 13.2 g/dL (ref 12.0–15.0)
Immature Granulocytes: 0 %
Lymphocytes Relative: 30 %
Lymphs Abs: 2.6 K/uL (ref 0.7–4.0)
MCH: 25.6 pg — ABNORMAL LOW (ref 26.0–34.0)
MCHC: 31.8 g/dL (ref 30.0–36.0)
MCV: 80.4 fL (ref 80.0–100.0)
Monocytes Absolute: 0.5 K/uL (ref 0.1–1.0)
Monocytes Relative: 5 %
Neutro Abs: 5.5 K/uL (ref 1.7–7.7)
Neutrophils Relative %: 62 %
Platelets: 375 K/uL (ref 150–400)
RBC: 5.16 MIL/uL — ABNORMAL HIGH (ref 3.87–5.11)
RDW: 14.7 % (ref 11.5–15.5)
WBC: 8.8 K/uL (ref 4.0–10.5)
nRBC: 0 % (ref 0.0–0.2)

## 2024-08-19 LAB — BASIC METABOLIC PANEL WITH GFR
Anion gap: 15 (ref 5–15)
BUN: 7 mg/dL (ref 6–20)
CO2: 18 mmol/L — ABNORMAL LOW (ref 22–32)
Calcium: 9.2 mg/dL (ref 8.9–10.3)
Chloride: 104 mmol/L (ref 98–111)
Creatinine, Ser: 0.6 mg/dL (ref 0.44–1.00)
GFR, Estimated: >60 mL/min (ref >60)
Glucose, Bld: 93 mg/dL (ref 70–99)
Potassium: 3.7 mmol/L (ref 3.5–5.1)
Sodium: 137 mmol/L (ref 135–145)

## 2024-08-19 LAB — HCG, SERUM, QUALITATIVE: Preg, Serum: NEGATIVE

## 2024-08-19 MED ORDER — KETOROLAC TROMETHAMINE 15 MG/ML IJ SOLN
15.0000 mg | Freq: Once | INTRAMUSCULAR | Status: AC
  Administered 2024-08-19: 15 mg via INTRAMUSCULAR
  Filled 2024-08-19: qty 1

## 2024-08-19 NOTE — ED Triage Notes (Addendum)
 Patient BIB [facility] from home. Complaining of vaginal bleeding, lower back, lower abdominal pain for 2 years. Third time this week this has happened. Appointment on 9/18 with OBGYN. Had ablation in 2014. No vomiting.

## 2024-08-19 NOTE — Telephone Encounter (Signed)
 Spoke with Ms. Melichar who called the office stating she starting having vaginal bleeding this morning with pelvic cramping that she rates the cramping/pain 10/10. Has taken 2 extras strength tylenol  that hasn't helped. Advised patient to try ibuprofen  and try heating pad to pelvis to help alleviate the cramping.  Patient has placed two peri pads on and hasn't saturated them yet. She also states she is passing nickel size clots only when she goes to the bathroom. Patient denies any activity prior to the bleeding and cramping.   Pt is having regular daily bowel movements, denies fever and or chills. Denies all urinary symptoms.   Reports the bleeding started a couple months ago and is on & off. States the bleeding last for a day, then stops and starts up again. When asked patient if she is still menstruating she states,  I am not suppose to because I had a uterine ablation in 2014  Pt denies taking all hormonal medications and states she is not currently sexually active. Pt recently had a visit with her gyn Dr. Rudy on 9/5. And a pelvic ultrasound on 8/26. Pt has a phone visit with Dr. Viktoria scheduled for Thursday, 9/18.   Advised patient that her message will be relayed to providers and the office would call back with any new recommendations.

## 2024-08-19 NOTE — Telephone Encounter (Signed)
 Spoke with Kristin Boyd who called the office back. Advised patient since she is currently in the ED she needs to stay for a complete work up and they may page the Gyn that is on-call for Dr. Gwendel service. Pt states, they are making me wait in the ED advised patient with her pain our office would recommend she be evaluated at the ED as well. The patient then hung up the phone.

## 2024-08-19 NOTE — ED Notes (Signed)
 Patient given urine cup. Understands the need for a sample. Patient said I should've gone somewhere else, I can't wait with this pain.

## 2024-08-19 NOTE — Discharge Instructions (Addendum)
 Encounter today was reassuring.  As we discussed recommend that you schedule ibuprofen  or Aleve  (not both) for the next 4 days for your vaginal bleeding and pelvic pain.  Please follow-up with OB/GYN as soon as possible.  If you develop worsening vaginal bleeding, abnormal vaginal discharge, fever or worsening abdominal pain or any other concerning symptom please return to the ED for further evaluation.

## 2024-08-19 NOTE — ED Provider Notes (Signed)
 Kristin Boyd EMERGENCY DEPARTMENT AT [facility] Provider Note   CSN: [id number] Arrival date & time: 08/19/24  1049     Patient presents with: Vaginal Bleeding  HPI Kristin Boyd is a 43 y.o. female presenting for vaginal bleeding and intermittent lower back and lower abdominal pain.  The intermittent back and lower abdominal pain has been going on for 2 years.  Patient is here because vaginal bleeding seem to be somewhat worse this morning.  She states she may have passed the clot.  Has only used 1 pad today.  Denies shortness of breath, lightheadedness or fatigue.  Denies chest pain.  She states she does have an appointment with her OB/GYN provider next week.  Taking Tylenol  and Aleve  intermittently for the lower abdominal pain.    Vaginal Bleeding      Prior to Admission medications   Medication Sig Start Date End Date Taking? Authorizing Provider  acetaminophen  (TYLENOL ) 325 MG tablet Take 2 tablets (650 mg total) by mouth every 6 (six) hours as needed. Patient not taking: Reported on 08/12/2024 03/01/24   Elnor Savant A, DO  amLODipine  (NORVASC ) 10 MG tablet TAKE 1 TABLET(10 MG) BY MOUTH DAILY 10/12/23   Viktoria Comer SAUNDERS, MD  estrogens , conjugated, (PREMARIN ) 0.625 MG tablet Take 1 tablet (0.625 mg total) by mouth daily. Take daily for 21 days then do not take for 7 days. Patient not taking: Reported on 08/12/2024 07/29/23   Ervin, Michael L, MD  ibuprofen  (ADVIL ) 600 MG tablet Take 1 tablet (600 mg total) by mouth every 6 (six) hours as needed. Patient not taking: Reported on 08/12/2024 03/01/24   Elnor Savant A, DO  medroxyPROGESTERone  (PROVERA ) 5 MG tablet Take 1 tablet (5 mg total) by mouth daily. Patient not taking: Reported on 08/12/2024 07/19/24   Palumbo, April, MD  methocarbamol  (ROBAXIN ) 500 MG tablet Take 1 tablet (500 mg total) by mouth 2 (two) times daily as needed for muscle spasms. Patient not taking: Reported on 08/12/2024 04/30/24   Neysa Caron PARAS, DO   metroNIDAZOLE  (FLAGYL ) 500 MG tablet Take 1 tablet (500 mg total) by mouth 2 (two) times daily. Patient not taking: Reported on 08/12/2024 07/19/24   Palumbo, April, MD  oxyCODONE  (ROXICODONE ) 5 MG immediate release tablet Take 1 tablet (5 mg total) by mouth every 6 (six) hours as needed for severe pain (pain score 7-10). Patient not taking: Reported on 08/12/2024 03/01/24   Elnor Savant LABOR, DO  oxyCODONE -acetaminophen  (PERCOCET/ROXICET) 5-325 MG tablet Take 1 tablet by mouth every 6 (six) hours as needed for severe pain (pain score 7-10). Patient not taking: Reported on 08/12/2024 04/09/24   Randol Simmonds, MD    Allergies: Peanut (diagnostic) and Penicillins    Review of Systems  Genitourinary:  Positive for vaginal bleeding.    Updated Vital Signs BP (!) 163/104   Pulse 82   Temp 98.6 F (37 C) (Oral)   Resp 19   Ht 5' 9 (1.753 m)   Wt (!) 160.1 kg   SpO2 97%   BMI 52.13 kg/m   Physical Exam Vitals and nursing note reviewed. Exam conducted with a chaperone present.  HENT:     Head: Normocephalic and atraumatic.     Mouth/Throat:     Mouth: Mucous membranes are moist.  Eyes:     General:        Right eye: No discharge.        Left eye: No discharge.     Conjunctiva/sclera: Conjunctivae normal.  Cardiovascular:  Rate and Rhythm: Normal rate and regular rhythm.     Pulses: Normal pulses.     Heart sounds: Normal heart sounds.  Pulmonary:     Effort: Pulmonary effort is normal.     Breath sounds: Normal breath sounds.  Abdominal:     General: Abdomen is flat. There is no distension.     Palpations: Abdomen is soft.     Tenderness: There is no abdominal tenderness.  Genitourinary:    Vagina: Normal.     Comments: No active bleeding noted in the vaginal vault.  There was a small clot noted at the cervical os which appeared to be closed.  No vaginal laceration or trauma noted.  No abnormal discharge.  No CMT. Skin:    General: Skin is warm and dry.  Neurological:     General:  No focal deficit present.  Psychiatric:        Mood and Affect: Mood normal.     (all labs ordered are listed, but only abnormal results are displayed) Labs Reviewed  CBC WITH DIFFERENTIAL/PLATELET - Abnormal; Notable for the following components:      Result Value   RBC 5.16 (*)    MCH 25.6 (*)    All other components within normal limits  BASIC METABOLIC PANEL WITH GFR - Abnormal; Notable for the following components:   CO2 18 (*)    All other components within normal limits  HCG, SERUM, QUALITATIVE  URINALYSIS, ROUTINE W REFLEX MICROSCOPIC    EKG: None  Radiology: No results found.   Procedures   Medications Ordered in the ED  ketorolac  (TORADOL ) 15 MG/ML injection 15 mg (has no administration in time range)                                    Medical Decision Making Amount and/or Complexity of Data Reviewed Labs: ordered.   43 year old well-appearing female presenting for vaginal bleeding and chronic abdominal pain and back pain. Exam notable for a small clot that in the vaginal vault but otherwise reassuring.  Labs are unremarkable and hemoglobin is 13.2.  Blood pressure is slightly elevated but other vital signs are normal.  Considered ectopic but unlikely given negative pregnancy test.  Also considered ovarian torsion, PID but unlikely given lack of symptoms and abdominal pain has remained unchanged for the past 2 years.  Suspect the pain is chronic.  I do feel that she does need to follow-up with OB/GYN.  Recommended that she take ibuprofen  or Aleve  scheduled for the next 4 days.  Discussed return precautions.  Discharged.     Final diagnoses:  Vaginal bleeding  Pelvic pain    ED Discharge Orders     None          Lang Norleen POUR, PA-C 08/19/24 1313    Neysa Caron PARAS, DO 08/19/24 1458

## 2024-08-22 ENCOUNTER — Encounter: Payer: Self-pay | Admitting: Obstetrics

## 2024-08-24 ENCOUNTER — Emergency Department (HOSPITAL_COMMUNITY)

## 2024-08-24 ENCOUNTER — Other Ambulatory Visit: Payer: Self-pay

## 2024-08-24 ENCOUNTER — Emergency Department (HOSPITAL_COMMUNITY)
Admission: EM | Admit: 2024-08-24 | Discharge: 2024-08-25 | Disposition: A | Discharge status: Home / Self Care | Attending: Emergency Medicine | Admitting: Emergency Medicine

## 2024-08-24 DIAGNOSIS — M545 Low back pain, unspecified: Secondary | ICD-10-CM | POA: Diagnosis present

## 2024-08-24 DIAGNOSIS — M5442 Lumbago with sciatica, left side: Secondary | ICD-10-CM | POA: Insufficient documentation

## 2024-08-24 DIAGNOSIS — Z91010 Allergy to peanuts: Secondary | ICD-10-CM | POA: Diagnosis not present

## 2024-08-24 DIAGNOSIS — G8929 Other chronic pain: Secondary | ICD-10-CM | POA: Insufficient documentation

## 2024-08-24 DIAGNOSIS — M5441 Lumbago with sciatica, right side: Secondary | ICD-10-CM | POA: Insufficient documentation

## 2024-08-24 DIAGNOSIS — N83202 Unspecified ovarian cyst, left side: Secondary | ICD-10-CM | POA: Insufficient documentation

## 2024-08-24 LAB — CBC WITH DIFFERENTIAL/PLATELET
Abs Immature Granulocytes: 0.04 K/uL (ref 0.00–0.07)
Basophils Absolute: 0.1 K/uL (ref 0.0–0.1)
Basophils Relative: 1 %
Eosinophils Absolute: 0.1 K/uL (ref 0.0–0.5)
Eosinophils Relative: 1 %
HCT: 42.3 % (ref 36.0–46.0)
Hemoglobin: 13.7 g/dL (ref 12.0–15.0)
Immature Granulocytes: 0 %
Lymphocytes Relative: 34 %
Lymphs Abs: 3.5 K/uL (ref 0.7–4.0)
MCH: 26.2 pg (ref 26.0–34.0)
MCHC: 32.4 g/dL (ref 30.0–36.0)
MCV: 81 fL (ref 80.0–100.0)
Monocytes Absolute: 0.8 K/uL (ref 0.1–1.0)
Monocytes Relative: 7 %
Neutro Abs: 5.9 K/uL (ref 1.7–7.7)
Neutrophils Relative %: 57 %
Platelets: 386 K/uL (ref 150–400)
RBC: 5.22 MIL/uL — ABNORMAL HIGH (ref 3.87–5.11)
RDW: 14.6 % (ref 11.5–15.5)
WBC: 10.4 K/uL (ref 4.0–10.5)
nRBC: 0 % (ref 0.0–0.2)

## 2024-08-24 LAB — URINALYSIS, ROUTINE W REFLEX MICROSCOPIC
Bilirubin Urine: NEGATIVE
Glucose, UA: NEGATIVE mg/dL
Ketones, ur: 5 mg/dL — AB
Leukocytes,Ua: NEGATIVE
Nitrite: NEGATIVE
Protein, ur: 30 mg/dL — AB
Specific Gravity, Urine: 1.029 (ref 1.005–1.030)
pH: 5 (ref 5.0–8.0)

## 2024-08-24 LAB — HCG, SERUM, QUALITATIVE: Preg, Serum: NEGATIVE

## 2024-08-24 LAB — COMPREHENSIVE METABOLIC PANEL WITH GFR
ALT: 17 U/L (ref 0–44)
AST: 19 U/L (ref 15–41)
Albumin: 3.7 g/dL (ref 3.5–5.0)
Alkaline Phosphatase: 63 U/L (ref 38–126)
Anion gap: 15 (ref 5–15)
BUN: 7 mg/dL (ref 6–20)
CO2: 20 mmol/L — ABNORMAL LOW (ref 22–32)
Calcium: 9 mg/dL (ref 8.9–10.3)
Chloride: 102 mmol/L (ref 98–111)
Creatinine, Ser: 0.8 mg/dL (ref 0.44–1.00)
GFR, Estimated: >60 mL/min (ref >60)
Glucose, Bld: 88 mg/dL (ref 70–99)
Potassium: 3.9 mmol/L (ref 3.5–5.1)
Sodium: 137 mmol/L (ref 135–145)
Total Bilirubin: 1 mg/dL (ref 0.0–1.2)
Total Protein: 8.1 g/dL (ref 6.5–8.1)

## 2024-08-24 LAB — LIPASE, BLOOD: Lipase: 22 U/L (ref 11–51)

## 2024-08-24 MED ORDER — AMLODIPINE BESYLATE 5 MG PO TABS
10.0000 mg | ORAL_TABLET | Freq: Once | ORAL | Status: AC
  Administered 2024-08-24: 10 mg via ORAL
  Filled 2024-08-24: qty 2

## 2024-08-24 MED ORDER — HYDROCODONE-ACETAMINOPHEN 5-325 MG PO TABS
1.0000 | ORAL_TABLET | Freq: Once | ORAL | Status: AC
  Administered 2024-08-24: 1 via ORAL
  Filled 2024-08-24: qty 1

## 2024-08-24 MED ORDER — OXYCODONE-ACETAMINOPHEN 5-325 MG PO TABS
1.0000 | ORAL_TABLET | Freq: Once | ORAL | Status: AC
  Administered 2024-08-24: 1 via ORAL
  Filled 2024-08-24: qty 1

## 2024-08-24 MED ORDER — KETOROLAC TROMETHAMINE 30 MG/ML IJ SOLN
30.0000 mg | Freq: Once | INTRAMUSCULAR | Status: AC
  Administered 2024-08-24: 30 mg via INTRAMUSCULAR
  Filled 2024-08-24: qty 1

## 2024-08-24 MED ORDER — KETOROLAC TROMETHAMINE 30 MG/ML IJ SOLN: 30.0000 mg | Freq: Once | INTRAMUSCULAR | Status: DC

## 2024-08-24 MED ORDER — LIDOCAINE 5 % EX PTCH
2.0000 | MEDICATED_PATCH | CUTANEOUS | Status: DC
  Administered 2024-08-24: 2 via TRANSDERMAL
  Filled 2024-08-24: qty 2

## 2024-08-24 NOTE — ED Notes (Signed)
 Pt ambulatory to bathroom

## 2024-08-24 NOTE — Discharge Instructions (Addendum)
 It was a pleasure taking care of you here today.  Make sure to follow-up outpatient with your back doctor as well as your OB/GYN.  Return for any worsening symptoms  Please take Naprosyn , 500mg  by mouth twice daily as needed for pain - this in an antiinflammatory medicine (NSAID) and is similar to ibuprofen  - many people feel that it is stronger than ibuprofen  and it is easier to take since it is a smaller pill.  Please use this only for 1 week - if your pain persists, you will need to follow up with your doctor in the office for ongoing guidance and pain control.

## 2024-08-24 NOTE — ED Provider Notes (Signed)
 [facility] Provider Note   CSN: [id number] Arrival date & time: 08/24/24  1229    Patient presents with: Back Pain and Abdominal Cramping   Kristin Boyd is a 43 y.o. female here for evaluation of lower back pain abdominal pain has history of similar.  States abdominal pain feels similar to her chronic pain however back pain feels slightly different.  She has noted some intermittent vaginal bleeding which she has been dealing with for a few months.  Follows with OB/GYN.  Back pain by flanks into the lower back.  No numbness or weakness.  No fever, bowel or bladder incontinence, saddle paresthesia.  Not sexually active.  Chest pain or shortness of breath.  Does have history of kidney stones, ovarian cyst.   HPI     Prior to Admission medications   Medication Sig Start Date End Date Taking? Authorizing Provider  acetaminophen  (TYLENOL ) 325 MG tablet Take 2 tablets (650 mg total) by mouth every 6 (six) hours as needed. Patient not taking: Reported on 08/12/2024 03/01/24   Elnor Savant A, DO  amLODipine  (NORVASC ) 10 MG tablet TAKE 1 TABLET(10 MG) BY MOUTH DAILY 10/12/23   Viktoria Comer SAUNDERS, MD  estrogens , conjugated, (PREMARIN ) 0.625 MG tablet Take 1 tablet (0.625 mg total) by mouth daily. Take daily for 21 days then do not take for 7 days. Patient not taking: Reported on 08/12/2024 07/29/23   Ervin, Michael L, MD  ibuprofen  (ADVIL ) 600 MG tablet Take 1 tablet (600 mg total) by mouth every 6 (six) hours as needed. Patient not taking: Reported on 08/12/2024 03/01/24   Elnor Savant A, DO  medroxyPROGESTERone  (PROVERA ) 5 MG tablet Take 1 tablet (5 mg total) by mouth daily. Patient not taking: Reported on 08/12/2024 07/19/24   Palumbo, April, MD  methocarbamol  (ROBAXIN ) 500 MG tablet Take 1 tablet (500 mg total) by mouth 2 (two) times daily as needed for muscle spasms. Patient not taking: Reported on 08/12/2024 04/30/24   Neysa Caron PARAS, DO  metroNIDAZOLE   (FLAGYL ) 500 MG tablet Take 1 tablet (500 mg total) by mouth 2 (two) times daily. Patient not taking: Reported on 08/12/2024 07/19/24   Palumbo, April, MD  oxyCODONE  (ROXICODONE ) 5 MG immediate release tablet Take 1 tablet (5 mg total) by mouth every 6 (six) hours as needed for severe pain (pain score 7-10). Patient not taking: Reported on 08/12/2024 03/01/24   Elnor Savant A, DO  oxyCODONE -acetaminophen  (PERCOCET/ROXICET) 5-325 MG tablet Take 1 tablet by mouth every 6 (six) hours as needed for severe pain (pain score 7-10). Patient not taking: Reported on 08/12/2024 04/09/24   Randol Simmonds, MD    Allergies: Peanut (diagnostic) and Penicillins    Review of Systems  Constitutional: Negative.   HENT: Negative.    Respiratory: Negative.    Cardiovascular: Negative.   Gastrointestinal:  Positive for abdominal pain. Negative for abdominal distention, anal bleeding, blood in stool, constipation, diarrhea, nausea, rectal pain and vomiting.  Genitourinary:  Positive for menstrual problem (chronic).  Musculoskeletal:  Positive for back pain.  Skin: Negative.   Neurological: Negative.   All other systems reviewed and are negative.   Updated Vital Signs BP (!) 154/96 (BP Location: Right Wrist)   Pulse 86   Temp 98.7 F (37.1 C) (Oral)   Resp 20   Ht 5' 9 (1.753 m)   Wt (!) 160.1 kg   SpO2 99%   BMI 52.13 kg/m   Physical Exam Vitals and nursing note reviewed.  Constitutional:      General: She is not in acute distress.    Appearance: She is well-developed. She is not ill-appearing, toxic-appearing or diaphoretic.  HENT:     Head: Normocephalic and atraumatic.     Nose: Nose normal.     Mouth/Throat:     Mouth: Mucous membranes are moist.  Eyes:     Pupils: Pupils are equal, round, and reactive to light.  Cardiovascular:     Rate and Rhythm: Normal rate.     Pulses: Normal pulses.     Heart sounds: Normal heart sounds.  Pulmonary:     Effort: Pulmonary effort is normal. No respiratory  distress.     Breath sounds: Normal breath sounds.  Abdominal:     General: Bowel sounds are normal. There is no distension.     Palpations: Abdomen is soft.     Tenderness: There is abdominal tenderness. There is no right CVA tenderness, left CVA tenderness, guarding or rebound.  Musculoskeletal:        General: Tenderness present. No swelling, deformity or signs of injury. Normal range of motion.     Cervical back: Normal range of motion.  Skin:    General: Skin is warm and dry.     Capillary Refill: Capillary refill takes less than 2 seconds.  Neurological:     General: No focal deficit present.     Mental Status: She is alert.     Cranial Nerves: No cranial nerve deficit.     Sensory: No sensory deficit.     Motor: No weakness.     Gait: Gait normal.  Psychiatric:        Mood and Affect: Mood normal.     (all labs ordered are listed, but only abnormal results are displayed) Labs Reviewed  CBC WITH DIFFERENTIAL/PLATELET - Abnormal; Notable for the following components:      Result Value   RBC 5.22 (*)    All other components within normal limits  COMPREHENSIVE METABOLIC PANEL WITH GFR - Abnormal; Notable for the following components:   CO2 20 (*)    All other components within normal limits  URINALYSIS, ROUTINE W REFLEX MICROSCOPIC - Abnormal; Notable for the following components:   Color, Urine AMBER (*)    APPearance CLOUDY (*)    Hgb urine dipstick LARGE (*)    Ketones, ur 5 (*)    Protein, ur 30 (*)    Bacteria, UA MANY (*)    All other components within normal limits  URINE CULTURE  LIPASE, BLOOD  HCG, SERUM, QUALITATIVE    EKG: None  Radiology: CT Renal Stone Study Result Date: 08/24/2024 CLINICAL DATA:  Abdominal/flank pain, stone suspected EXAM: CT ABDOMEN AND PELVIS WITHOUT CONTRAST TECHNIQUE: Multidetector CT imaging of the abdomen and pelvis was performed following the standard protocol without IV contrast. RADIATION DOSE REDUCTION: This exam was  performed according to the departmental dose-optimization program which includes automated exposure control, adjustment of the mA and/or kV according to patient size and/or use of iterative reconstruction technique. COMPARISON:  Ultrasound pelvis 08/03/2023, CT renal 09/26/2022, CT abdomen pelvis 06/01/2024, MRI pelvis 07/01/2024 FINDINGS: Lower chest: No acute abnormality.  Tiny hiatal hernia. Hepatobiliary: No focal liver abnormality. Status post cholecystectomy. No biliary dilatation. Pancreas: No focal lesion. Normal pancreatic contour. No surrounding inflammatory changes. No main pancreatic ductal dilatation. Spleen: Normal in size without focal abnormality. Adrenals/Urinary Tract: No adrenal nodule bilaterally. 5 mm left nephrolithiasis. No right nephrolithiasis. No ureterolithiasis bilaterally. The urinary bladder is  unremarkable. Stomach/Bowel: Stomach is within normal limits. No evidence of bowel wall thickening or dilatation. Appendix appears normal. Vascular/Lymphatic: No abdominal aorta or iliac aneurysm. No abdominal, pelvic, or inguinal lymphadenopathy. Reproductive: Redemonstration of a 10 x 13 cm left adnexal cyst. Finding appears increased in size compared to 2025 but stable in size compared to 2024. Possible persistent associated left hydrosalpinx. Uterus and right adnexa are unremarkable. Other: No intraperitoneal free fluid. No intraperitoneal free gas. No organized fluid collection. Musculoskeletal: No abdominal wall hernia or abnormality. No suspicious lytic or blastic osseous lesions. No acute displaced fracture. IMPRESSION: 1. Grossly stable in size 13 cm left ovarian cyst that is previously characterized as an endometrioma on MRI pelvis 07/01/2024. Ovarian torsion not excluded. For evaluation of ovarian torsion, ultrasound pelvis may be of value. Recommend gynecologic consultation. 2. Nonobstructive 5 mm right nephrolithiasis. Electronically Signed   By: Morgane  Naveau M.D.   On: 08/24/2024  22:28     Procedures   Medications Ordered in the ED  lidocaine  (LIDODERM ) 5 % 2 patch (2 patches Transdermal Patch Applied 08/24/24 2248)  oxyCODONE -acetaminophen  (PERCOCET/ROXICET) 5-325 MG per tablet 1 tablet (1 tablet Oral Given 08/24/24 1306)  amLODipine  (NORVASC ) tablet 10 mg (10 mg Oral Given 08/24/24 1306)  HYDROcodone -acetaminophen  (NORCO/VICODIN) 5-325 MG per tablet 1 tablet (1 tablet Oral Given 08/24/24 2248)  ketorolac  (TORADOL ) 30 MG/ML injection 30 mg (30 mg Intramuscular Given 08/24/24 7286)   43 year old here for evaluation of lower back pain and abdominal pain.  Has history of similar.  Her lower back pain is worse than normal.  Goes from her mid flanks to lower back.  She is some chronic lower abdominal pain which she states does feel normal.  She has had intermittent issues with vaginal bleeding over the last few months.  Followed by OB/GYN for this.  She had an ultrasound in August.  Not sexually active.  No urinary symptoms.  No recent falls or injuries.  No history of IVDU, bowel or bladder incontinence, saddle paresthesia.  Ambulatory here.  No clinical evidence of VTE.  No history of AAA or dissection.  Labs and imaging personally viewed and interpreted:  CBC without leukocytosis, hemoglobin stable 13.7 Metabolic panel without significant abnormality Lipase 22 UA many bacteria, negative nitrites, negative leuks, sent for culture CT stone study shows grossly stable 13 cm left ovarian cyst. Rec pelvic US   Patient reassessed.  We discussed labs and imaging.  Her pain is controlled.  Will get ultrasound.  Care transferred to [facility] who will FU on US , if negative for torsion can likely DC home                                   Medical Decision Making Amount and/or Complexity of Data Reviewed External Data Reviewed: labs, radiology and notes. Labs: ordered. Decision-making details documented in ED Course. Radiology: ordered and independent interpretation performed.  Decision-making details documented in ED Course.  Risk OTC drugs. Prescription drug management. Decision regarding hospitalization. Diagnosis or treatment significantly limited by social determinants of health.       Final diagnoses:  Left ovarian cyst  Chronic midline low back pain with bilateral sciatica    ED Discharge Orders     None          Justine Cossin A, PA-C 08/24/24 2351    Curatolo, Adam, DO 08/25/24 1734

## 2024-08-24 NOTE — ED Notes (Addendum)
Patient in bathroom attempting urine sample.

## 2024-08-24 NOTE — ED Notes (Signed)
 Patient unable to provide urine sample. Patient with urine cup in bag in wheelchair.

## 2024-08-24 NOTE — ED Notes (Signed)
 Patient endorses hypertension as provider has prescribed medicine but endorses noncompliance.

## 2024-08-24 NOTE — ED Triage Notes (Signed)
 Patient arrives via [facility] for low back pain and abdominal cramping beginning at 0600 today. Ambulatory on scene. Endorses intermittent spotting, waiting to get into OB. Alert oriented x4. No nausea/vomiting.   EMS vitals 126/84 HR 100 RR 24 99 on room air

## 2024-08-24 NOTE — ED Provider Triage Note (Signed)
 Emergency Medicine Provider Triage Evaluation Note  Kristin Boyd , a 43 y.o. female  was evaluated in triage.  Pt complains of low back pain rating down both legs.  The patient states has been going on now for the past few weeks.  She has known bulging disc in her back.  She denies any fevers or IV drug use.  Denies bowel or bladder dysfunction or saddle anesthesia.  States that she has also been having some intermittent abdominal cramping and vaginal bleeding that has been relatively consistent for the past 2 months.  Was seen a few days ago for this and had favorable workup.  She reports symptoms today are similar..  Review of Systems  Positive: See above Negative: Chest pain  Physical Exam  BP (!) 178/113   Pulse 79   Temp 98.2 F (36.8 C)   Resp 20   Ht 5' 9 (1.753 m)   Wt (!) 160.1 kg   SpO2 97%   BMI 52.13 kg/m  Gen:   Awake, appears uncomfortable Resp:  Normal effort  MSK:   Moves extremities without difficulty  Other:  Normal strength and sensation in upper and lower extremities, 2+ radial pulses bilaterally  Medical Decision Making  Medically screening exam initiated at 1:03 PM.  Appropriate orders placed.  Kristin Boyd was informed that the remainder of the evaluation will be completed by another provider, this initial triage assessment does not replace that evaluation, and the importance of remaining in the ED until their evaluation is complete.     Kristin Prentice SAUNDERS, MD 08/24/24 [phone]

## 2024-08-25 ENCOUNTER — Inpatient Hospital Stay: Attending: Gynecologic Oncology | Admitting: Gynecologic Oncology

## 2024-08-25 DIAGNOSIS — N809 Endometriosis, unspecified: Secondary | ICD-10-CM

## 2024-08-25 DIAGNOSIS — Z7189 Other specified counseling: Secondary | ICD-10-CM | POA: Diagnosis not present

## 2024-08-25 DIAGNOSIS — N9489 Other specified conditions associated with female genital organs and menstrual cycle: Secondary | ICD-10-CM

## 2024-08-25 MED ORDER — NAPROXEN 500 MG PO TABS
500.0000 mg | ORAL_TABLET | Freq: Two times a day (BID) | ORAL | 0 refills | Status: DC
Start: 2024-08-25 — End: 2024-08-25

## 2024-08-25 MED ORDER — NORETHINDRONE ACETATE 5 MG PO TABS: 5.0000 mg | ORAL_TABLET | Freq: Every day | ORAL | 3 refills | Status: DC

## 2024-08-25 MED ORDER — NAPROXEN 500 MG PO TABS: 500.0000 mg | ORAL_TABLET | Freq: Two times a day (BID) | ORAL | 0 refills | Status: DC

## 2024-08-25 NOTE — ED Provider Notes (Signed)
  Physical Exam  BP (!) 154/96 (BP Location: Right Wrist)   Pulse 86   Temp 98.7 F (37.1 C) (Oral)   Resp 20   Ht 5' 9 (1.753 m)   Wt (!) 160.1 kg   SpO2 99%   BMI 52.13 kg/m   Physical Exam Vitals and nursing note reviewed.  Constitutional:      General: She is not in acute distress.    Appearance: Normal appearance. She is not ill-appearing.  Eyes:     General:        Right eye: No discharge.        Left eye: No discharge.     Extraocular Movements: Extraocular movements intact.     Conjunctiva/sclera: Conjunctivae normal.  Cardiovascular:     Rate and Rhythm: Normal rate and regular rhythm.  Pulmonary:     Effort: Pulmonary effort is normal. No respiratory distress.  Neurological:     General: No focal deficit present.     Mental Status: She is alert and oriented to person, place, and time. Mental status is at baseline.     Procedures  Procedures  ED Course / MDM   Clinical Course as of 08/25/24 0045  Wed Aug 24, 2024  2350 Chronic back and abdominal pain. CT shows complex ovarian cyst that is already being followed but radiology requested torsion rule out. Send home if negative.  [CB]    Clinical Course User Index [CB] Beola Terrall RAMAN, PA-C   Medical Decision Making Amount and/or Complexity of Data Reviewed Labs: ordered. Radiology: ordered.  Risk Prescription drug management.   Patient care assumed from previous provider, please see their note for further information.  On handoff, awaiting ultrasound to rule out torsion with follow-up as already established with OB/GYN and PCP.  Ultrasound did not show any torsion at this time.  Will have her continue to follow-up with specialists.  Provided naproxen  for pain.  Patient vital signs have remained stable throughout the course of patient's time in the ED. Low suspicion for any other emergent pathology at this time. I believe this patient is safe to be discharged. Provided strict return to ER  precautions. Patient expressed agreement and understanding of plan. All questions were answered.       Beola Terrall RAMAN, PA-C 08/25/24 0046    Theadore Ozell HERO, MD 08/25/24 [phone]

## 2024-08-25 NOTE — Progress Notes (Unsigned)
 Gynecologic Oncology Telehealth Note: Gyn-Onc  I connected with Kristin Boyd on 08/26/24 at  6:00 PM EDT by telephone and verified that I am speaking with the correct person using two identifiers.  I discussed the limitations, risks, security and privacy concerns of performing an evaluation and management service by telemedicine and the availability of in-person appointments. I also discussed with the patient that there may be a patient responsible charge related to this service. The patient expressed understanding and agreed to proceed.  Other persons participating in the visit and their role in the encounter: none.  Patient's location: home, [place] Provider's location: [place]  Reason for Visit: follow-up  Treatment History: Patient's history is notable for an endometrial ablation at the age of 52.  She describes her periods as being quite heavy at this time and occurring sometimes multiple times a month.  She then had intermittent pain after the ablation but then in 2019 started to have progressive pain.  She ultimately underwent imaging with CT scan and pelvic ultrasound in August 2020 which showed an 8.3 x 6.6 x 8.9 cm complex cystic and solid left adnexal mass.  Follow-up ultrasound showed a complex and cystic lesion in the left adnexa with a tubular shape measuring 12.6 x 5.6 x 7.4 cm; mass on imaging was felt to perhaps represent a tubo-ovarian abscess.  CA125 was obtained at that time and was mildly elevated at 51.  The plan at that time was for referral to GYN oncology, which did not appear to have happened.     She then had multiple visits in the emergency department with persistence of the mass noted.  Most recently, she had a consultation with Dr. Court at [facility] with a plan to proceed with surgical excision.  Most recent imaging by ultrasound in mid October showed a left adnexal mass measuring 10.3 x 11.4 x 11.2 cm with associated septation and vascularity although poorly  visualized.  Patient was seen in our emergency department on the fifth of this month.  She underwent CT and pelvic ultrasound which show persistent mass without significant change in size since most recent ultrasound.    She has had multiple visits recently to emergency departments in various health systems.  Her most recent imaging at [facility] on 08/25/24 was a pelvic ultrasound showing a 10.9 cm left ovarian/paraovarian cystic mass with diffuse homogenous low-level internal echoes compatible with prior MRI diagnosis of likely endometrioma.  Chronic hematometra is noted.  Nonvisualization of the right ovary.  Interval History: Endorses constant pain, abdominal pain and back pain.   Has been off endometriosis treatment for some time.  Notes Lupron  helped significantly with her symptoms. Bleeding started 1-2 months. Sometimes light, other times heavy. Lasts 2-3 days, stops for 1-2 days, then restarts.   Past Medical/Surgical History: Past Medical History:  Diagnosis Date   Arthritis    Chronic knee pain    Chronic pelvic pain in female    Endometriosis    Hypertension    Kidney stones    Obese    Ovarian cyst    Pneumonia due to COVID-19 virus 05/26/2019   Postoperative state 11/12/2021   Vaginal Pap smear, abnormal     Past Surgical History:  Procedure Laterality Date   CESAREAN SECTION     CHOLECYSTECTOMY     DILATION AND CURETTAGE OF UTERUS N/A 11/12/2021   Procedure: ATTEMPTED HYSTEROSCOPY;  Surgeon: Viktoria Comer SAUNDERS, MD;  Location: [facility];  Service: Gynecology;  Laterality: N/A;  ENDOMETRIAL ABLATION     WISDOM TOOTH EXTRACTION     2 removed   XI ROBOTIC ASSISTED SALPINGECTOMY N/A 11/12/2021   Procedure: DIAGNOSTIC LAPAROSCOPY, PERITONEAL BIOPSIES;  Surgeon: Viktoria Comer SAUNDERS, MD;  Location: [facility];  Service: Gynecology;  Laterality: N/A;    Family History  Problem Relation Age of Onset   Hypertension Father    Diabetes Father    Colon cancer Neg Hx    Breast cancer Neg  Hx    Ovarian cancer Neg Hx    Endometrial cancer Neg Hx    Pancreatic cancer Neg Hx    Prostate cancer Neg Hx     Social History   Socioeconomic History   Marital status: Widowed    Spouse name: Not on file   Number of children: 2   Years of education: Not on file   Highest education level: Not on file  Occupational History   Not on file  Tobacco Use   Smoking status: Every Day    Types: Cigarettes   Smokeless tobacco: Never  Vaping Use   Vaping status: Never Used  Substance and Sexual Activity   Alcohol use: Yes    Comment: occasional   Drug use: Never   Sexual activity: Not Currently    Birth control/protection: Surgical  Other Topics Concern   Not on file  Social History Narrative   Not on file   Social Drivers of Health   Financial Resource Strain: Low Risk  (07/06/2024)   Received from [facility]   Overall Financial Resource Strain (CARDIA)    How hard is it for you to pay for the very basics like food, housing, medical care, and heating?: Not very hard  Food Insecurity: No Food Insecurity (07/06/2024)   Received from [facility]   Hunger Vital Sign    Within the past 12 months, you worried that your food would run out before you got the money to buy more.: Never true    Within the past 12 months, the food you bought just didn't last and you didn't have money to get more.: Never true  Transportation Needs: No Transportation Needs (07/06/2024)   Received from [facility] - Transportation    In the past 12 months, has lack of transportation kept you from medical appointments or from getting medications?: No    In the past 12 months, has lack of transportation kept you from meetings, work, or from getting things needed for daily living?: No  Physical Activity: Insufficiently Active (07/06/2024)   Received from [facility]   Exercise Vital Sign    On average, how many days per week do you engage in moderate to strenuous exercise (like a brisk  walk)?: 2 days    On average, how many minutes do you engage in exercise at this level?: 10 min  Stress: No Stress Concern Present (07/06/2024)   Received from [facility] - Occupational Stress Questionnaire    Do you feel stress - tense, restless, nervous, or anxious, or unable to sleep at night because your mind is troubled all the time - these days?: Not at all  Social Connections: Socially Integrated (07/06/2024)   Received from [facility]   Social Network    How would you rate your social network (family, work, friends)?: Good participation with social networks    Current Medications:  Current Outpatient Medications:    norethindrone  (AYGESTIN ) 5 MG tablet, Take 1 tablet (5  mg total) by mouth daily., Disp: 30 tablet, Rfl: 3   acetaminophen  (TYLENOL ) 325 MG tablet, Take 2 tablets (650 mg total) by mouth every 6 (six) hours as needed. (Patient not taking: Reported on 08/12/2024), Disp: 36 tablet, Rfl: 0   amLODipine  (NORVASC ) 10 MG tablet, TAKE 1 TABLET(10 MG) BY MOUTH DAILY, Disp: 30 tablet, Rfl: 0   estrogens , conjugated, (PREMARIN ) 0.625 MG tablet, Take 1 tablet (0.625 mg total) by mouth daily. Take daily for 21 days then do not take for 7 days. (Patient not taking: Reported on 08/12/2024), Disp: 30 tablet, Rfl: 5   ibuprofen  (ADVIL ) 600 MG tablet, Take 1 tablet (600 mg total) by mouth every 6 (six) hours as needed. (Patient not taking: Reported on 08/12/2024), Disp: 30 tablet, Rfl: 0   methocarbamol  (ROBAXIN ) 500 MG tablet, Take 1 tablet (500 mg total) by mouth 2 (two) times daily as needed for muscle spasms. (Patient not taking: Reported on 08/12/2024), Disp: 14 tablet, Rfl: 0   metroNIDAZOLE  (FLAGYL ) 500 MG tablet, Take 1 tablet (500 mg total) by mouth 2 (two) times daily. (Patient not taking: Reported on 08/12/2024), Disp: 14 tablet, Rfl: 0   naproxen  (NAPROSYN ) 500 MG tablet, Take 1 tablet (500 mg total) by mouth 2 (two) times daily., Disp: 30  tablet, Rfl: 0   oxyCODONE  (ROXICODONE ) 5 MG immediate release tablet, Take 1 tablet (5 mg total) by mouth every 6 (six) hours as needed for severe pain (pain score 7-10). (Patient not taking: Reported on 08/12/2024), Disp: 5 tablet, Rfl: 0   oxyCODONE -acetaminophen  (PERCOCET/ROXICET) 5-325 MG tablet, Take 1 tablet by mouth every 6 (six) hours as needed for severe pain (pain score 7-10). (Patient not taking: Reported on 08/12/2024), Disp: 10 tablet, Rfl: 0  Review of Symptoms: Pertinent positives as per HPI.  Physical Exam: Deferred given limitations of phone visit.  Laboratory & Radiologic Studies: None new  Assessment & Plan: Kristin Boyd is a 43 y.o. woman with Stage IV endometriosis.  Patient has been struggling significantly with symptoms, most notably pain, since coming off of medical treatment for endometriosis.  On Lupron , the size of her complex left adnexal cyst had decreased to 6.6 cm and she reports significant improvement in pain.  Discussed again my concerns with surgery in the setting of her morbid obesity as well as diffuse endometriosis.  Reviewed that surgical intervention will require a large incision and would likely require removal of some of her bowel.  I am concerned given her symptoms and what is likely infiltrative disease that surgery might not completely resolve her pain symptoms.  We discussed again the role of medical therapy in the treatment of endometriosis.  She is amenable to proceeding with treatment again and now that she has Medicaid, would be opening to restarting Lupron .  I will have my office investigate the cost of this through the cancer center versus her OB/GYN office.  Her preference is to get her follow-up care and Lupron  here as long as there is not a large cost difference.  If she does well on Lupron , can consider some Aygestin  for add back therapy.  In the setting of her postmenopausal bleeding, discussed several options for this.  She had an  ablation approximately 11 years ago.  She was told at that time that she would not have further bleeding.  We discussed that endometrial ablations are not permanent and that especially in younger patients, it is common to see bleeding restart.  Her bleeding pattern would be suggestive of anovulatory  bleeding.  Given inability to sample her endometrium at the time that she had surgery with me due to cervical stenosis, we discussed option of trying again to do an endometrial biopsy versus getting an MRI to better evaluate the lining of her uterus.  She would like to proceed with pelvic MRI.  My office will work to get her scheduled for Lupron  depending on the cost of this through the cancer center.  I will notify her with MRI results.  I discussed the assessment and treatment plan with the patient. The patient was provided with an opportunity to ask questions and all were answered. The patient agreed with the plan and demonstrated an understanding of the instructions.   The patient was advised to call back or see an in-person evaluation if the symptoms worsen or if the condition fails to improve as anticipated.   20 minutes of total time was spent for this patient encounter, including preparation, phone counseling with the patient and coordination of care, and documentation of the encounter.   Comer Dollar, MD  Division of Gynecologic Oncology  Department of Obstetrics and Gynecology  [facility] [place]  [facility]

## 2024-08-25 NOTE — ED Notes (Signed)
 Pt given bus pass upon discharge

## 2024-08-26 ENCOUNTER — Encounter: Payer: Self-pay | Admitting: Gynecologic Oncology

## 2024-08-26 ENCOUNTER — Other Ambulatory Visit: Payer: Self-pay | Admitting: Gynecologic Oncology

## 2024-08-26 NOTE — Progress Notes (Signed)
 Lupron  11.25 mg every 12 weeks ordered per Dr. Viktoria. Will check with auth team on cost for patient.

## 2024-08-27 ENCOUNTER — Emergency Department (HOSPITAL_COMMUNITY)

## 2024-08-27 ENCOUNTER — Emergency Department (HOSPITAL_COMMUNITY)
Admission: EM | Admit: 2024-08-27 | Discharge: 2024-08-27 | Disposition: A | Discharge status: Home / Self Care | Attending: Emergency Medicine | Admitting: Emergency Medicine

## 2024-08-27 DIAGNOSIS — Z91010 Allergy to peanuts: Secondary | ICD-10-CM | POA: Insufficient documentation

## 2024-08-27 DIAGNOSIS — N83202 Unspecified ovarian cyst, left side: Secondary | ICD-10-CM | POA: Insufficient documentation

## 2024-08-27 DIAGNOSIS — Z79899 Other long term (current) drug therapy: Secondary | ICD-10-CM | POA: Diagnosis not present

## 2024-08-27 DIAGNOSIS — M5432 Sciatica, left side: Secondary | ICD-10-CM | POA: Diagnosis not present

## 2024-08-27 DIAGNOSIS — R109 Unspecified abdominal pain: Secondary | ICD-10-CM | POA: Diagnosis present

## 2024-08-27 LAB — CBC
HCT: 41.1 % (ref 36.0–46.0)
Hemoglobin: 12.9 g/dL (ref 12.0–15.0)
MCH: 25.4 pg — ABNORMAL LOW (ref 26.0–34.0)
MCHC: 31.4 g/dL (ref 30.0–36.0)
MCV: 81.1 fL (ref 80.0–100.0)
Platelets: 343 K/uL (ref 150–400)
RBC: 5.07 MIL/uL (ref 3.87–5.11)
RDW: 15.1 % (ref 11.5–15.5)
WBC: 10.7 K/uL — ABNORMAL HIGH (ref 4.0–10.5)
nRBC: 0 % (ref 0.0–0.2)

## 2024-08-27 LAB — COMPREHENSIVE METABOLIC PANEL WITH GFR
ALT: 17 U/L (ref 0–44)
AST: 16 U/L (ref 15–41)
Albumin: 4.1 g/dL (ref 3.5–5.0)
Alkaline Phosphatase: 69 U/L (ref 38–126)
Anion gap: 14 (ref 5–15)
BUN: 10 mg/dL (ref 6–20)
CO2: 19 mmol/L — ABNORMAL LOW (ref 22–32)
Calcium: 9.2 mg/dL (ref 8.9–10.3)
Chloride: 108 mmol/L (ref 98–111)
Creatinine, Ser: 0.65 mg/dL (ref 0.44–1.00)
GFR, Estimated: >60 mL/min (ref >60)
Glucose, Bld: 98 mg/dL (ref 70–99)
Potassium: 3.8 mmol/L (ref 3.5–5.1)
Sodium: 141 mmol/L (ref 135–145)
Total Bilirubin: 0.6 mg/dL (ref 0.0–1.2)
Total Protein: 7.5 g/dL (ref 6.5–8.1)

## 2024-08-27 LAB — URINALYSIS, ROUTINE W REFLEX MICROSCOPIC
Bilirubin Urine: NEGATIVE
Glucose, UA: NEGATIVE mg/dL
Hgb urine dipstick: NEGATIVE
Ketones, ur: NEGATIVE mg/dL
Leukocytes,Ua: NEGATIVE
Nitrite: NEGATIVE
Protein, ur: NEGATIVE mg/dL
Specific Gravity, Urine: 1.019 (ref 1.005–1.030)
pH: 6 (ref 5.0–8.0)

## 2024-08-27 LAB — LIPASE, BLOOD: Lipase: 11 U/L (ref 11–51)

## 2024-08-27 LAB — HCG, SERUM, QUALITATIVE: Preg, Serum: NEGATIVE

## 2024-08-27 MED ORDER — MORPHINE SULFATE (PF) 4 MG/ML IV SOLN
4.0000 mg | Freq: Once | INTRAVENOUS | Status: AC
  Administered 2024-08-27: 4 mg via INTRAVENOUS
  Filled 2024-08-27: qty 1

## 2024-08-27 MED ORDER — METHYLPREDNISOLONE 4 MG PO TBPK
ORAL_TABLET | ORAL | 0 refills | Status: DC
Start: 2024-08-27 — End: 2024-10-21

## 2024-08-27 MED ORDER — KETOROLAC TROMETHAMINE 15 MG/ML IJ SOLN
15.0000 mg | Freq: Once | INTRAMUSCULAR | Status: AC
  Administered 2024-08-27: 15 mg via INTRAVENOUS
  Filled 2024-08-27: qty 1

## 2024-08-27 MED ORDER — ACETAMINOPHEN 500 MG PO TABS
1000.0000 mg | ORAL_TABLET | Freq: Once | ORAL | Status: AC
  Administered 2024-08-27: 1000 mg via ORAL
  Filled 2024-08-27: qty 2

## 2024-08-27 NOTE — Discharge Instructions (Addendum)
 Your pain today is likely attributed to a combination of factors, including your known diagnosis of sciatica and your known diagnosis of a complex left ovarian cyst.  Please follow-up with your OB/GYN and spine specialist as previously discussed.  Continue ibuprofen  as needed for pain.  Start Medrol  Dosepak and use as directed for sciatica pain.  Return to the emergency department if your symptoms worsen.

## 2024-08-27 NOTE — ED Triage Notes (Signed)
 Patient BIB EMS for complaints of left lower back pain and left lower abdominal pain. Patient states she has a herniated disc and sciatica, and has a left ovarian cyst. Has chronic pain and sees OB GYN for cyst and has an appt with Spinal Specialist. Patient tearful at triage and rates pain 10/10.    142/98 78 97% RA 18

## 2024-08-27 NOTE — ED Provider Notes (Signed)
 [place] EMERGENCY DEPARTMENT AT [facility] Provider Note   CSN: [id number] Arrival date & time: 08/27/24  9098     Patient presents with: No chief complaint on file.   Kristin Boyd is a 43 y.o. female.   43 year old female presenting with back/abdominal pain.  Patient reports that the pain woke her up out of her sleep around 6 AM, denies any known inciting event/trigger/injury.  She reports left-sided back/flank pain with radiation to her left thigh, no numbness/tingling.  She took 2 Tylenol  at home before coming to the emergency department.  She has history of sciatica but reports that this pain is sharper.  She has a history of a complex left ovarian cyst as well as left-sided nephrolithiasis that was noted on her recent CT, she is followed closely by OB/GYN for this.  Patient was seen for similar complaints on 9/18 where a CT scan and pelvic ultrasound was completed.  She denies chest pain, shortness of breath, dysuria, fever, nausea/vomiting.  She is scheduled to see a spine specialist for her suspected sciatica at the end of this month.        Prior to Admission medications   Medication Sig Start Date End Date Taking? Authorizing Provider  acetaminophen  (TYLENOL ) 325 MG tablet Take 2 tablets (650 mg total) by mouth every 6 (six) hours as needed. Patient not taking: Reported on 08/12/2024 03/01/24   Elnor Savant A, DO  amLODipine  (NORVASC ) 10 MG tablet TAKE 1 TABLET(10 MG) BY MOUTH DAILY 10/12/23   Viktoria Comer SAUNDERS, MD  estrogens , conjugated, (PREMARIN ) 0.625 MG tablet Take 1 tablet (0.625 mg total) by mouth daily. Take daily for 21 days then do not take for 7 days. Patient not taking: Reported on 08/12/2024 07/29/23   Ervin, Michael L, MD  ibuprofen  (ADVIL ) 600 MG tablet Take 1 tablet (600 mg total) by mouth every 6 (six) hours as needed. Patient not taking: Reported on 08/12/2024 03/01/24   Elnor Savant LABOR, DO  methocarbamol  (ROBAXIN ) 500 MG tablet Take 1 tablet (500  mg total) by mouth 2 (two) times daily as needed for muscle spasms. Patient not taking: Reported on 08/12/2024 04/30/24   Neysa Caron PARAS, DO  metroNIDAZOLE  (FLAGYL ) 500 MG tablet Take 1 tablet (500 mg total) by mouth 2 (two) times daily. Patient not taking: Reported on 08/12/2024 07/19/24   Palumbo, April, MD  naproxen  (NAPROSYN ) 500 MG tablet Take 1 tablet (500 mg total) by mouth 2 (two) times daily. 08/25/24   Beola Terrall RAMAN, PA-C  norethindrone  (AYGESTIN ) 5 MG tablet Take 1 tablet (5 mg total) by mouth daily. 08/25/24   Viktoria Comer SAUNDERS, MD  oxyCODONE  (ROXICODONE ) 5 MG immediate release tablet Take 1 tablet (5 mg total) by mouth every 6 (six) hours as needed for severe pain (pain score 7-10). Patient not taking: Reported on 08/12/2024 03/01/24   Elnor Savant LABOR, DO  oxyCODONE -acetaminophen  (PERCOCET/ROXICET) 5-325 MG tablet Take 1 tablet by mouth every 6 (six) hours as needed for severe pain (pain score 7-10). Patient not taking: Reported on 08/12/2024 04/09/24   Randol Simmonds, MD    Allergies: Peanut (diagnostic) and Penicillins    Review of Systems  Updated Vital Signs  Vitals:   08/27/24 0905 08/27/24 0909 08/27/24 1246 08/27/24 1301  BP:  (!) 171/114 (!) 160/98   Pulse: 88  90   Resp: 20  20   Temp: 98.8 F (37.1 C)   98.2 F (36.8 C)  TempSrc: Oral   Oral  SpO2: 93%  100%   Weight: (!) 158.8 kg     Height: 5' 9 (1.753 m)        Physical Exam Vitals and nursing note reviewed.  Constitutional:      General: She is in acute distress.     Comments: tearful  HENT:     Head: Normocephalic.  Eyes:     Extraocular Movements: Extraocular movements intact.  Cardiovascular:     Rate and Rhythm: Normal rate and regular rhythm.  Pulmonary:     Effort: Pulmonary effort is normal.     Breath sounds: Normal breath sounds.  Abdominal:     Palpations: Abdomen is soft.     Tenderness: There is abdominal tenderness (left quadrants/flank). There is left CVA tenderness. There is no right CVA  tenderness or guarding.  Musculoskeletal:     Cervical back: Normal range of motion and neck supple. No tenderness.     Comments: Moves all extremities spontaneously without difficulty Back: No midline spinal TTP. Left paralumbar/parathoracic muscle TTP.   Skin:    General: Skin is warm and dry.  Neurological:     Mental Status: She is alert and oriented to person, place, and time.     (all labs ordered are listed, but only abnormal results are displayed) Labs Reviewed - No data to display  EKG: None  Radiology: No results found.   Procedures   Medications Ordered in the ED  ketorolac  (TORADOL ) 15 MG/ML injection 15 mg (15 mg Intravenous Given 08/27/24 1033)  morphine  (PF) 4 MG/ML injection 4 mg (4 mg Intravenous Given 08/27/24 1243)                                    Medical Decision Making This patient presents to the ED for concern of flank/abdominal pain, this involves an extensive number of treatment options, and is a complaint that carries with it a high risk of complications and morbidity.  The differential diagnosis includes nephrolithiasis, ureterolithiasis, sciatica, ovarian torsion, pancreatitis   Co morbidities that complicate the patient evaluation  Obesity, known complex left ovarian cyst, known diagnosis of sciatica   Additional history obtained:  Additional history obtained from record review External records from outside source obtained and reviewed including recent emergency department notes   Lab Tests:  I Ordered, and personally interpreted labs.  The pertinent results include: CBC notable for mild leukocytosis of 10.7, this is largely stable as compared to recent lab results from 3 days ago.  Serum hCG negative.  CMP stable from previous.  Lipase within normal limits.  Urinalysis within normal limits, no evidence of infectious etiology.   Imaging Studies ordered:  I ordered imaging studies including CT renal stone study I independently  visualized and interpreted imaging which showed 1. No nephroureterolithiasis or obstructive uropathy on either side. No acute inflammatory process identified within the abdomen or pelvis. 2. Redemonstration of a well-circumscribed 10.5 x 12.8 cm slightly low-attenuation structure in the left adnexa, which is unchanged since multiple prior studies at least dating back to July 2023. There is subtle surrounding fat stranding especially along the left anterolateral aspect, which is also present since multiple prior studies. 3. There is small amount of fat stranding around the inferior tip of the right hepatic lobe, which is also present since multiple prior studies. This is also nonspecific and may represent sequela of fat necrosis. 4. Multiple other nonacute observations, as described above.  I agree with the radiologist interpretation   Cardiac Monitoring: / EKG:  The patient was maintained on a cardiac monitor.  I personally viewed and interpreted the cardiac monitored which showed an underlying rhythm of: NSR  Problem List / ED Course / Critical interventions / Medication management  I ordered medication including Toradol  and morphine  for pain Reevaluation of the patient after these medicines showed that the patient improved I have reviewed the patients home medicines and have made adjustments as needed   Social Determinants of Health:  Tobacco use   Test / Admission - Considered:  Physical exam notable as above.  I suspect the patient's pain is multifactorial, given that she has known diagnosis of herniated disc/sciatica as well as complex left ovarian cyst.  Patient does demonstrate left CVA tenderness as well as mild tenderness to palpation of her left abdominal quadrants, no midline spinal tenderness to palpation.  Will proceed with CT renal stone study given known left-sided kidney stone from previous imaging.  Patient does have diagnosis of complex left ovarian cyst, CT imaging is  reassuring as above however cannot rule out ovarian torsion.  I spoke in depth with the patient about this, I advised that given the size of her cyst she is at greater risk of experiencing torsion, however if she would not like to proceed with a pelvic ultrasound today given that she recently had one several days ago I feel that this would be a fair plan.  She requests to undergo pelvic ultrasound today for further evaluation of this, I feel that this is reasonable. Results of pelvic ultrasound are largely stable from previous, no torsion.  I discussed these findings in depth with the patient today.  I encouraged her to continue follow-up with her OB/GYN, she tells me that they are planning to restart her on Lupron  as this previously managed her symptoms from her ovarian cyst well.  She is also scheduled to see a spine specialist at the end of this month in regard to her herniated disc/sciatica.  Will prescribe Medrol  Dosepak for suspected sciatica pain.  Return precautions discussed.  She is in agreement with this plan and is appropriate for discharge at this time.    Amount and/or Complexity of Data Reviewed Labs: ordered. Radiology: ordered.  Risk Prescription drug management.        Final diagnoses:  Flank pain  Sciatica of left side  Left ovarian cyst    ED Discharge Orders     None          Glendia Rocky SAILOR, [place] 08/27/24 1527    Levander [place], MD 08/29/24 1213

## 2024-09-02 ENCOUNTER — Telehealth: Payer: Self-pay | Admitting: *Deleted

## 2024-09-02 NOTE — Telephone Encounter (Signed)
-----   Message from Eleanor JONETTA Epps sent at 08/25/2024  5:49 PM EDT ----- Dr. Viktoria is going to order an MRI for her. She also wants to check and see how much the lupron  injections will be for her since now she has Medicaid. She told the patient someone would call her tomorrow with this information.  She is also going to send in Aygestin . She told the patient to call the office if this is too expensive.

## 2024-09-02 NOTE — Telephone Encounter (Signed)
 Spoke with infusion pharmacy in regards to Lupron  cost for Ms. Kristin Boyd. Was told to send Wetzel Memos a message in regards to cost and prior authorization. Prior authorization has been sent to [employer].

## 2024-09-05 ENCOUNTER — Encounter: Payer: Self-pay | Admitting: Gynecologic Oncology

## 2024-09-06 ENCOUNTER — Encounter: Payer: Self-pay | Admitting: Gynecologic Oncology

## 2024-09-06 ENCOUNTER — Other Ambulatory Visit: Payer: Self-pay | Admitting: *Deleted

## 2024-09-06 DIAGNOSIS — N809 Endometriosis, unspecified: Secondary | ICD-10-CM

## 2024-09-06 NOTE — Telephone Encounter (Signed)
 Attempted to reach patient in regards to her Lupron  being authorized and scheduling her first injection. Left voicemail requesting call back to [phone].

## 2024-09-06 NOTE — Telephone Encounter (Signed)
 Spoke with Ms. Pell and patient is scheduled for a lab appt. For pregnancy test on 10/8 at 1000 and her Lupron  injection appt. Is scheduled for 10/8 at 1030. Pt agreed to date and time and had no further concerns at this time.

## 2024-09-13 ENCOUNTER — Ambulatory Visit: Payer: Self-pay | Admitting: Gynecologic Oncology

## 2024-09-13 ENCOUNTER — Ambulatory Visit (HOSPITAL_COMMUNITY)
Admission: RE | Admit: 2024-09-13 | Discharge: 2024-09-13 | Disposition: A | Discharge status: Home / Self Care | Source: Ambulatory Visit | Attending: Gynecologic Oncology | Admitting: Gynecologic Oncology

## 2024-09-13 DIAGNOSIS — N9489 Other specified conditions associated with female genital organs and menstrual cycle: Secondary | ICD-10-CM | POA: Diagnosis present

## 2024-09-13 DIAGNOSIS — N809 Endometriosis, unspecified: Secondary | ICD-10-CM | POA: Insufficient documentation

## 2024-09-13 MED ORDER — GADOBUTROL 1 MMOL/ML IV SOLN
10.0000 mL | Freq: Once | INTRAVENOUS | Status: AC | PRN
  Administered 2024-09-13: 10 mL via INTRAVENOUS

## 2024-09-14 ENCOUNTER — Inpatient Hospital Stay: Attending: Gynecologic Oncology

## 2024-09-14 ENCOUNTER — Inpatient Hospital Stay

## 2024-09-14 VITALS — BP 156/88 | HR 84 | Temp 97.8°F | Resp 15

## 2024-09-14 DIAGNOSIS — Z79818 Long term (current) use of other agents affecting estrogen receptors and estrogen levels: Secondary | ICD-10-CM | POA: Diagnosis not present

## 2024-09-14 DIAGNOSIS — Z3202 Encounter for pregnancy test, result negative: Secondary | ICD-10-CM | POA: Insufficient documentation

## 2024-09-14 DIAGNOSIS — N809 Endometriosis, unspecified: Secondary | ICD-10-CM | POA: Diagnosis present

## 2024-09-14 LAB — PREGNANCY, URINE: Preg Test, Ur: NEGATIVE

## 2024-09-14 MED ORDER — LEUPROLIDE ACETATE (3 MONTH) 11.25 MG IM KIT
11.2500 mg | PACK | Freq: Once | INTRAMUSCULAR | Status: AC
  Administered 2024-09-14: 11.25 mg via INTRAMUSCULAR
  Filled 2024-09-14: qty 11.25

## 2024-09-14 MED ORDER — LEUPROLIDE ACETATE (3 MONTH) 11.25 MG IM KIT
11.2500 mg | PACK | Freq: Once | INTRAMUSCULAR | Status: DC
  Filled 2024-09-14: qty 22.5

## 2024-09-19 ENCOUNTER — Other Ambulatory Visit: Payer: Self-pay | Admitting: *Deleted

## 2024-09-19 ENCOUNTER — Telehealth: Payer: Self-pay | Admitting: *Deleted

## 2024-09-19 NOTE — Telephone Encounter (Signed)
 Spoke with Ms. Cogan who states the injection site on her buttocks from Lupron  is still sore? Pt denies bruising, redness or edema from the site that she can tell from looking in the mirror. Advised patient to try placing ice to the area 15 minutes on and then off alternating throughout the day. Pt states she is taking naprosyn  and also suggested adding tylenol . Pt also states her abdominal cramping/pain started back up and when will the Lupron  start working? Advised patient it may take some time for the Lupron  to start working since this is the first injection. Pt also advised after the injection she may experience generalized body aches that will resolve. Pt verbalized understanding and thanked the office for calling.

## 2024-09-20 ENCOUNTER — Other Ambulatory Visit: Payer: Self-pay

## 2024-09-20 ENCOUNTER — Emergency Department (HOSPITAL_COMMUNITY)

## 2024-09-20 ENCOUNTER — Emergency Department (HOSPITAL_COMMUNITY)
Admission: EM | Admit: 2024-09-20 | Discharge: 2024-09-20 | Disposition: A | Discharge status: Home / Self Care | Attending: Emergency Medicine | Admitting: Emergency Medicine

## 2024-09-20 ENCOUNTER — Encounter (HOSPITAL_COMMUNITY): Payer: Self-pay

## 2024-09-20 ENCOUNTER — Ambulatory Visit (INDEPENDENT_AMBULATORY_CARE_PROVIDER_SITE_OTHER): Admitting: Obstetrics and Gynecology

## 2024-09-20 VITALS — BP 117/80 | HR 87 | Ht 69.0 in | Wt 348.0 lb

## 2024-09-20 DIAGNOSIS — N809 Endometriosis, unspecified: Secondary | ICD-10-CM

## 2024-09-20 DIAGNOSIS — R1032 Left lower quadrant pain: Secondary | ICD-10-CM | POA: Insufficient documentation

## 2024-09-20 DIAGNOSIS — N9489 Other specified conditions associated with female genital organs and menstrual cycle: Secondary | ICD-10-CM

## 2024-09-20 DIAGNOSIS — D72829 Elevated white blood cell count, unspecified: Secondary | ICD-10-CM | POA: Diagnosis not present

## 2024-09-20 DIAGNOSIS — R102 Pelvic and perineal pain unspecified side: Secondary | ICD-10-CM

## 2024-09-20 DIAGNOSIS — R1022 Pelvic and perineal pain left side: Secondary | ICD-10-CM | POA: Diagnosis present

## 2024-09-20 DIAGNOSIS — Z91010 Allergy to peanuts: Secondary | ICD-10-CM | POA: Insufficient documentation

## 2024-09-20 LAB — COMPREHENSIVE METABOLIC PANEL WITH GFR
ALT: 20 U/L (ref 0–44)
AST: 19 U/L (ref 15–41)
Albumin: 4.3 g/dL (ref 3.5–5.0)
Alkaline Phosphatase: 72 U/L (ref 38–126)
Anion gap: 13 (ref 5–15)
BUN: 17 mg/dL (ref 6–20)
CO2: 20 mmol/L — ABNORMAL LOW (ref 22–32)
Calcium: 9.4 mg/dL (ref 8.9–10.3)
Chloride: 109 mmol/L (ref 98–111)
Creatinine, Ser: 0.92 mg/dL (ref 0.44–1.00)
GFR, Estimated: >60 mL/min (ref >60)
Glucose, Bld: 104 mg/dL — ABNORMAL HIGH (ref 70–99)
Potassium: 4 mmol/L (ref 3.5–5.1)
Sodium: 141 mmol/L (ref 135–145)
Total Bilirubin: 1.1 mg/dL (ref 0.0–1.2)
Total Protein: 7.9 g/dL (ref 6.5–8.1)

## 2024-09-20 LAB — HCG, SERUM, QUALITATIVE: Preg, Serum: NEGATIVE

## 2024-09-20 LAB — CBC WITH DIFFERENTIAL/PLATELET
Abs Immature Granulocytes: 0.05 K/uL (ref 0.00–0.07)
Basophils Absolute: 0.1 K/uL (ref 0.0–0.1)
Basophils Relative: 1 %
Eosinophils Absolute: 0.1 K/uL (ref 0.0–0.5)
Eosinophils Relative: 1 %
HCT: 44.2 % (ref 36.0–46.0)
Hemoglobin: 14 g/dL (ref 12.0–15.0)
Immature Granulocytes: 0 %
Lymphocytes Relative: 22 %
Lymphs Abs: 2.9 K/uL (ref 0.7–4.0)
MCH: 26 pg (ref 26.0–34.0)
MCHC: 31.7 g/dL (ref 30.0–36.0)
MCV: 82.2 fL (ref 80.0–100.0)
Monocytes Absolute: 0.9 K/uL (ref 0.1–1.0)
Monocytes Relative: 7 %
Neutro Abs: 9 K/uL — ABNORMAL HIGH (ref 1.7–7.7)
Neutrophils Relative %: 69 %
Platelets: 365 K/uL (ref 150–400)
RBC: 5.38 MIL/uL — ABNORMAL HIGH (ref 3.87–5.11)
RDW: 15.6 % — ABNORMAL HIGH (ref 11.5–15.5)
WBC: 13 K/uL — ABNORMAL HIGH (ref 4.0–10.5)
nRBC: 0 % (ref 0.0–0.2)

## 2024-09-20 LAB — LIPASE, BLOOD: Lipase: 12 U/L (ref 11–51)

## 2024-09-20 MED ORDER — HYDROMORPHONE HCL 1 MG/ML IJ SOLN
0.5000 mg | Freq: Once | INTRAMUSCULAR | Status: AC
  Administered 2024-09-20: 0.5 mg via INTRAVENOUS
  Filled 2024-09-20: qty 1

## 2024-09-20 MED ORDER — HYDROMORPHONE HCL 1 MG/ML IJ SOLN
1.0000 mg | Freq: Once | INTRAMUSCULAR | Status: AC
  Administered 2024-09-20: 1 mg via INTRAVENOUS
  Filled 2024-09-20: qty 1

## 2024-09-20 MED ORDER — KETOROLAC TROMETHAMINE 30 MG/ML IJ SOLN
30.0000 mg | Freq: Once | INTRAMUSCULAR | Status: AC
  Administered 2024-09-20: 30 mg via INTRAVENOUS
  Filled 2024-09-20: qty 1

## 2024-09-20 MED ORDER — OXYCODONE HCL 10 MG PO TABS: 10.0000 mg | ORAL_TABLET | Freq: Four times a day (QID) | ORAL | 0 refills | Status: DC | PRN

## 2024-09-20 NOTE — Progress Notes (Signed)
 Pt is in office for follow up/surgical consult.  Pt had recent visit to ED in Sept, had scan that showed lesion on cyst.  Pt is currently on Lupron , first injection was last week.

## 2024-09-20 NOTE — ED Triage Notes (Signed)
 Pt BIB EMS with chronic right lower back pain and LLQ pain from an ovarian cyst x 3 days.

## 2024-09-20 NOTE — ED Provider Notes (Signed)
 [place] EMERGENCY DEPARTMENT AT [facility] Provider Note   CSN: [id number] Arrival date & time: 09/20/24  1446     Patient presents with: Abdominal Pain   Kristin Boyd is a 43 y.o. female with history of endometriosis, obesity, presenting to ED with left lower abdominal pain and pelvic pain.  Patient has been evaluated several times in the past few years in the ED for similar presentation of acute pain.  She had an MRI of her pelvis performed 1 week ago, with impression as noted below.  Today she was seen by surgical OBGYN Dr Rannie in the office for second opinion regarding potential surgery for her chronic pain.  She presents to the ED now complaining of persistent and severe pain in her left pelvis.  She says this is worse than normal.  She has been taking ibuprofen  at home which is not relieving her pain.  She is also within the past month been seen in the ED and had 2 CT renal stone studies which did not show obvious intra-abdominal cause of her pain but notes again this abnormal cystic structure on the left ovary.  MRI pelvis 09/13/24:  MPRESSION: 1. There is a 9.3 x 11.4 cm lesion in the left adnexa, which most likely arises from the left ovary. The lesion exhibits T1 hyperintensity and T2 shading. There is no enhancing component. There is no mural nodularity. Findings favor endometrioma. 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 2. There are 4.1 x 5.4 cm T1 hyperintense, nonenhancing areas in the uterus which exhibit T2 shading. 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 No discrete uterine myometrial lesion seen. There is no enhancing component within the endometrium. 3. Right ovary is within normal limits.   HPI     Prior to Admission  medications   Medication Sig Start Date End Date Taking? Authorizing Provider  oxyCODONE  10 MG TABS Take 1 tablet (10 mg total) by mouth every 6 (six) hours as needed for up to 20 doses for severe pain (pain score 7-10). 09/20/24  Yes Jarris Kortz, Donnice PARAS, MD  acetaminophen  (TYLENOL ) 325 MG tablet Take 2 tablets (650 mg total) by mouth every 6 (six) hours as needed. Patient not taking: Reported on 08/12/2024 03/01/24   Elnor Savant A, DO  amLODipine  (NORVASC ) 10 MG tablet TAKE 1 TABLET(10 MG) BY MOUTH DAILY 10/12/23   Viktoria Comer SAUNDERS, MD  estrogens , conjugated, (PREMARIN ) 0.625 MG tablet Take 1 tablet (0.625 mg total) by mouth daily. Take daily for 21 days then do not take for 7 days. Patient not taking: Reported on 08/12/2024 07/29/23   Ervin, Michael L, MD  gabapentin  (NEURONTIN ) 100 MG capsule Take 100 mg by mouth 3 (three) times daily.    [provider]  ibuprofen  (ADVIL ) 600 MG tablet Take 1 tablet (600 mg total) by mouth every 6 (six) hours as needed. Patient not taking: Reported on 08/12/2024 03/01/24   Elnor Savant A, DO  methocarbamol  (ROBAXIN ) 500 MG tablet Take 1 tablet (500 mg total) by mouth 2 (two) times daily as needed for muscle spasms. Patient not taking: Reported on 08/12/2024 04/30/24   Neysa Caron PARAS, DO  methylPREDNISolone  (MEDROL  DOSEPAK) 4 MG TBPK tablet Use as indicated on packaging 08/27/24   Glendia Rocky SAILOR, PA-C  metroNIDAZOLE  (FLAGYL ) 500 MG tablet Take 1 tablet (500 mg total) by  mouth 2 (two) times daily. Patient not taking: Reported on 08/12/2024 07/19/24   Palumbo, April, MD  naproxen  (NAPROSYN ) 500 MG tablet Take 1 tablet (500 mg total) by mouth 2 (two) times daily. 08/25/24   Beola Terrall RAMAN, PA-C  norethindrone  (AYGESTIN ) 5 MG tablet Take 1 tablet (5 mg total) by mouth daily. 08/25/24   Viktoria Comer SAUNDERS, MD  oxyCODONE  (ROXICODONE ) 5 MG immediate release tablet Take 1 tablet (5 mg total) by mouth every 6 (six) hours as needed for severe pain (pain score 7-10). Patient  not taking: Reported on 08/12/2024 03/01/24   Elnor Savant A, DO  oxyCODONE -acetaminophen  (PERCOCET/ROXICET) 5-325 MG tablet Take 1 tablet by mouth every 6 (six) hours as needed for severe pain (pain score 7-10). Patient not taking: Reported on 08/12/2024 04/09/24   Randol Simmonds, MD    Allergies: Peanut (diagnostic) and Penicillins    Review of Systems  Updated Vital Signs BP (!) 157/93 (BP Location: Left Arm)   Pulse 90   Temp 98.7 F (37.1 C) (Oral)   Resp 16   SpO2 100%   Physical Exam Constitutional:      General: She is in acute distress.     Appearance: She is obese.  HENT:     Head: Normocephalic and atraumatic.  Eyes:     Conjunctiva/sclera: Conjunctivae normal.     Pupils: Pupils are equal, round, and reactive to light.  Cardiovascular:     Rate and Rhythm: Normal rate and regular rhythm.  Pulmonary:     Effort: Pulmonary effort is normal. No respiratory distress.  Abdominal:     General: There is no distension.     Tenderness: There is no abdominal tenderness.  Skin:    General: Skin is warm and dry.  Neurological:     General: No focal deficit present.     Mental Status: She is alert. Mental status is at baseline.  Psychiatric:        Mood and Affect: Mood normal.        Behavior: Behavior normal.     (all labs ordered are listed, but only abnormal results are displayed) Labs Reviewed  COMPREHENSIVE METABOLIC PANEL WITH GFR - Abnormal; Notable for the following components:      Result Value   CO2 20 (*)    Glucose, Bld 104 (*)    All other components within normal limits  CBC WITH DIFFERENTIAL/PLATELET - Abnormal; Notable for the following components:   WBC 13.0 (*)    RBC 5.38 (*)    RDW 15.6 (*)    Neutro Abs 9.0 (*)    All other components within normal limits  LIPASE, BLOOD  HCG, SERUM, QUALITATIVE  URINALYSIS, ROUTINE W REFLEX MICROSCOPIC    EKG: None  Radiology: US  Pelvis Complete Result Date: 09/20/2024 CLINICAL DATA:  Left side pelvic  pain, prior salpingectomy. EXAM: TRANSABDOMINAL AND TRANSVAGINAL ULTRASOUND OF PELVIS TECHNIQUE: Both transabdominal and transvaginal ultrasound examinations of the pelvis were performed. Transabdominal technique was performed for global imaging of the pelvis including uterus, ovaries, adnexal regions, and pelvic cul-de-sac. It was necessary to proceed with endovaginal exam following the transabdominal exam to visualize the endometrium. COMPARISON:  MRI pelvis 09/13/2024 ultrasound 08/27/2024 FINDINGS: Uterus Measurements: 10.3 x 7.2 x 8.3 cm = volume: 322 mL. No fibroids or other mass visualized. Endometrium Thickness: 11 mm in thickness. Limited visualization. No focal abnormality visualized. Right ovary Measurements: Not visualized.  No adnexal mass seen. Left ovary Measurements: Not visualized.  No adnexal mass seen.  Other findings No abnormal free fluid. IMPRESSION: Neither ovary visualized due to bowel gas and body habitus. No adnexal mass seen. Electronically Signed   By: Franky Crease M.D.   On: 09/20/2024 17:24   US  PELVIC COMPLETE WITH TRANSVAGINAL Result Date: 09/20/2024 CLINICAL DATA:  Left side pelvic pain, prior salpingectomy. EXAM: TRANSABDOMINAL AND TRANSVAGINAL ULTRASOUND OF PELVIS TECHNIQUE: Both transabdominal and transvaginal ultrasound examinations of the pelvis were performed. Transabdominal technique was performed for global imaging of the pelvis including uterus, ovaries, adnexal regions, and pelvic cul-de-sac. It was necessary to proceed with endovaginal exam following the transabdominal exam to visualize the endometrium. COMPARISON:  MRI pelvis 09/13/2024 ultrasound 08/27/2024 FINDINGS: Uterus Measurements: 10.3 x 7.2 x 8.3 cm = volume: 322 mL. No fibroids or other mass visualized. Endometrium Thickness: 11 mm in thickness. Limited visualization. No focal abnormality visualized. Right ovary Measurements: Not visualized.  No adnexal mass seen. Left ovary Measurements: Not visualized.  No  adnexal mass seen. Other findings No abnormal free fluid. IMPRESSION: Neither ovary visualized due to bowel gas and body habitus. No adnexal mass seen. Electronically Signed   By: Franky Crease M.D.   On: 09/20/2024 17:24     Procedures   Medications Ordered in the ED  HYDROmorphone  (DILAUDID ) injection 0.5 mg (has no administration in time range)  HYDROmorphone  (DILAUDID ) injection 1 mg (1 mg Intravenous Given 09/20/24 1612)  ketorolac  (TORADOL ) 30 MG/ML injection 30 mg (30 mg Intravenous Given 09/20/24 1612)                                    Medical Decision Making Amount and/or Complexity of Data Reviewed Labs: ordered. Radiology: ordered.  Risk Prescription drug management.   This patient presents to the ED with concern for left abdominal or left-sided pelvic pain. This involves an extensive number of treatment options, and is a complaint that carries with it a high risk of complications and morbidity.  The differential diagnosis includes endometriosis versus ovarian torsion versus ruptured ovarian cyst versus other  Co-morbidities that complicate the patient evaluation: History of complex endometrial lesions, possible endometriosis  External records from outside source obtained and reviewed including MRI report from last month, office visit with surgical OB/GYN attending today  I ordered and personally interpreted labs.  The pertinent results include: No emergent findings.  There is a minor leukocytosis that is nonspecific  I ordered imaging studies including ultrasound of the pelvis I independently visualized and interpreted imaging which showed limited views of ovaries due to habitus  I ordered medication including IV pain medication  I have reviewed the patients home medicines and have made adjustments as needed  Test Considered: Very low suspicion for intra-abdominal cause of the patient's pain.  She has had numerous CT scans including 2 within the past month for  similar presentations, with no emergent findings.  I do not think this is a kidney stone or diverticulitis.  After the interventions noted above, I reevaluated the patient and found that they have: improved  The patient has significant improvement of her pain with the initial round of IV pain medications given.  I was able to have a better discussion with her about the nature and chronicity of her pain.  I do agree this is most likely related to endometriosis, and also with the abnormal MRI findings noted recently on her outpatient pelvic MRI, with a hydrosalpinx and enlarged Perry adnexal mass or cyst.  Unfortunately she is a complicated patient regarding surgery, which she very much desires.  This would not be done on an emergent basis in the hospital regardless.  Does appear that we are able to better control her pain with the opioid medications.  This is not a long-term solution but clearly she is having severe and debilitating pain that is not being controlled with the ibuprofen  prescribed to her, and the Lupron  also appears not to be having much of an early effect.  Therefore I will prescribe her short course of oxycodone .  I advised again that she follow-up with her OB for next steps.  She is comfortable with this plan and ready for discharge  Disposition:  After consideration of the diagnostic results and the patients response to treatment, I feel that the patent would benefit from specialist follow-up with her OB.      Final diagnoses:  Pelvic pain    ED Discharge Orders          Ordered    oxyCODONE  10 MG TABS  Every 6 hours PRN        09/20/24 1829               Cottie Donnice PARAS, MD 09/20/24 [id number]

## 2024-09-20 NOTE — Progress Notes (Signed)
 GYNECOLOGY VISIT  Patient name: Kristin Boyd MRN [id number]  Date of birth: January 09, 1981 Chief Complaint:   Follow-up  History:  Recently started on lupron  for endometriosis. Had set up this appointment for second opinion regarding surgery fo rcyst removal and removal and endometriosis removal. Has been placed on medications by gyn onc for suppression but has concerns/wonders about surgical intervention. Worried about the cyst bursting and making things worse. Expresses concerns about continued pain as well. Having some intermittent bleeding as well. On galifree (?) to help stop the bleeding as well. Initially was not having any cycles or pain after ablation; did not have pain until a few years ago. Started having bleeding a few months ago. Ok with a complete hysterectomy and ok with bilateral oophorectomy if it will help with pain    Would not want a longstanding catheter or a colostomy bag (son has a colostomy bag and she sees what he goes through with the bag). She expresses frustration with the pain (abdominal and back pain).   Currently taking gabapentin  for pain. States naproxen /ibuprofen  don't help as much at this point. Reports having difficulty with swallowing pills but taking oxycodone  for pain relief   Reports last attempt at PT she couldn't do it due to pain   The following portions of the patient's history were reviewed and updated as appropriate: allergies, current medications, past family history, past medical history, past social history, past surgical history and problem list.   Health Maintenance:   Last pap     Component Value Date/Time   DIAGPAP  08/12/2024 0923    - Negative for intraepithelial lesion or malignancy (NILM)   HPVHIGH Negative 08/12/2024 0923   ADEQPAP  08/12/2024 0923    Satisfactory for evaluation; transformation zone component ABSENT.    Health Maintenance  Topic Date Due   Hepatitis C Screening  Never done   DTaP/Tdap/Td vaccine (1 - Tdap)  Never done   Pneumococcal Vaccine (1 of 2 - PCV) Never done   Hepatitis B Vaccine (1 of 3 - 19+ 3-dose series) Never done   HPV Vaccine (1 - 3-dose SCDM series) Never done   Flu Shot  Never done   COVID-19 Vaccine (1 - 2025-26 season) Never done   Breast Cancer Screening  10/14/2024   Pap with HPV screening  08/12/2029   HIV Screening  Completed   Meningitis B Vaccine  Aged Out      Review of Systems:  Pertinent items are noted in HPI. Comprehensive review of systems was otherwise negative.   Objective:  Physical Exam BP 117/80   Pulse 87   Ht 5' 9 (1.753 m)   Wt (!) 348 lb (157.9 kg)   BMI 51.39 kg/m    Physical Exam Vitals and nursing note reviewed.  Constitutional:      Appearance: Normal appearance.  HENT:     Head: Normocephalic and atraumatic.  Pulmonary:     Effort: Pulmonary effort is normal.  Abdominal:     Comments: Generalized abdominal tenderness  Skin:    General: Skin is warm and dry.  Neurological:     General: No focal deficit present.     Mental Status: She is alert.  Psychiatric:        Mood and Affect: Mood normal.        Behavior: Behavior normal.        Thought Content: Thought content normal.        Judgment: Judgment normal.  Labs and Imaging MR Pelvis W Wo Contrast Result Date: 09/13/2024 CLINICAL DATA:  Adnexal mass, indeterminate, pre-menopausal known endometriosis, now with bleeding after long period of amenorrhea in the setting of prior endometrial ablation. EXAM: MRI PELVIS WITHOUT AND WITH CONTRAST TECHNIQUE: Multiplanar multisequence MR imaging of the pelvis was performed both before and after administration of intravenous contrast. CONTRAST:  10mL GADAVIST  GADOBUTROL  1 MMOL/ML IV SOLN COMPARISON:  Pelvic ultrasound and CT scan renal stone protocol from 08/27/2024. FINDINGS: Urinary Tract: Limited evaluation of bilateral kidneys on coronal images. There is a subcentimeter sized simple cyst in the left kidney interpolar region,  laterally. No suspicious renal lesions seen. No hydroureteronephrosis. Urinary bladder is within normal limits. Bowel:  Unremarkable visualized pelvic bowel loops. Vascular/Lymphatic: No pathologically enlarged lymph nodes. No significant vascular abnormality seen. Reproductive: Anteverted uterus exhibiting 4.1 x 5.4 cm T1 hyperintense, nonenhancing areas which exhibit T2 shading. 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 No discrete uterine myometrial lesion seen. There is no enhancing component within the endometrium. The cervix and vagina appears within normal limits. No suspicious mass. Right ovary appears within normal limits (series 13, image 11). No suspicious mass. There is a 9.3 x 11.4 cm T1 hyperintense lesion in the left adnexa which exhibits T2 shading. Normal left ovarian parenchyma is not distinctly seen however, the lesion most likely arises from the left ovary. The lesion does not exhibit enhancement on the postcontrast images. There is no mural nodularity. Along the anterior aspect of the lesion, medially, there is a dilated, T1 hyperintense nonenhancing fallopian tube with diameter up to 9 mm. Other:  None. Musculoskeletal: No suspicious bone lesions identified. IMPRESSION: 1. There is a 9.3 x 11.4 cm lesion in the left adnexa, which most likely arises from the left ovary. The lesion exhibits T1 hyperintensity and T2 shading. There is no enhancing component. There is no mural nodularity. Findings favor endometrioma. 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 2. There are 4.1 x 5.4 cm T1 hyperintense, nonenhancing areas in the uterus which exhibit T2 shading. 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 No discrete uterine myometrial lesion seen. There is no enhancing  component within the endometrium. 3. Right ovary is within normal limits. Electronically Signed   By: Ree Molt M.D.   On: 09/13/2024 12:02   US  Pelvis Complete Result Date: 08/27/2024 CLINICAL DATA:  complex left ovarian cyst, recurrent LLQ/flank pain. EXAM: TRANSABDOMINAL AND TRANSVAGINAL ULTRASOUND OF PELVIS DOPPLER ULTRASOUND OF OVARIES TECHNIQUE: Both transabdominal and transvaginal ultrasound examinations of the pelvis were performed. Transabdominal technique was performed for global imaging of the pelvis including uterus, ovaries, adnexal regions, and pelvic cul-de-sac. It was necessary to proceed with endovaginal exam following the transabdominal exam to visualize the uterus and endometrium. Color and duplex Doppler ultrasound was utilized to evaluate blood flow to the ovaries. COMPARISON:  CT scan renal stone protocol from earlier the same day and ultrasound pelvis from 08/24/2024. FINDINGS: Uterus Measurements: 6.5 x 9.1 x 11.8 cm = volume: 365.8 mL. No fibroids or other mass visualized. Endometrium Thickness: There is distention of the endometrial cavity with fluid (combined thickness up to 2.4 cm) with moderate echogenic debris. No focal solid mass. The endometrial thickness is up to 10-11 mm. Right ovary Measurements: 1.7 x 2.8 x 3.2 cm = volume: 8.0 mL. Normal appearance/no adnexal mass. Left ovary Measurements: 10.7 x  11.2 x 12.4 cm = volume: 777 mL. Left ovary is not distinctly visualized however, there is a large anechoic lesion with homogeneous echogenic debris in the left adnexa, which is favored ovarian in etiology. The structure is present since multiple prior studies. The structure exhibits mildly thick irregular wall, which exhibits normal vascularity. No discrete mural nodule seen. Pulsed Doppler evaluation of both ovaries demonstrates normal low-resistance arterial and venous waveforms. Other findings No abnormal free fluid. IMPRESSION: 1. There is a large anechoic lesion with  homogeneous echogenic debris in the left adnexa, which is favored ovarian in etiology. The structure is present since multiple prior studies. The structure exhibits mildly thick irregular wall, which exhibits normal vascularity. No discrete mural nodule seen. Differential diagnosis includes endometrioma versus hemorrhagic cyst. Consider nonemergent contrast-enhanced MRI pelvis for further evaluation. 2. There is distention of the endometrial cavity with fluid (combined thickness up to 2.4 cm) with moderate echogenic debris. No focal solid mass. The endometrial thickness is up to 10-11 mm. Electronically Signed   By: Ree Molt M.D.   On: 08/27/2024 14:52   US  Transvaginal Non-OB Result Date: 08/27/2024 CLINICAL DATA:  complex left ovarian cyst, recurrent LLQ/flank pain. EXAM: TRANSABDOMINAL AND TRANSVAGINAL ULTRASOUND OF PELVIS DOPPLER ULTRASOUND OF OVARIES TECHNIQUE: Both transabdominal and transvaginal ultrasound examinations of the pelvis were performed. Transabdominal technique was performed for global imaging of the pelvis including uterus, ovaries, adnexal regions, and pelvic cul-de-sac. It was necessary to proceed with endovaginal exam following the transabdominal exam to visualize the uterus and endometrium. Color and duplex Doppler ultrasound was utilized to evaluate blood flow to the ovaries. COMPARISON:  CT scan renal stone protocol from earlier the same day and ultrasound pelvis from 08/24/2024. FINDINGS: Uterus Measurements: 6.5 x 9.1 x 11.8 cm = volume: 365.8 mL. No fibroids or other mass visualized. Endometrium Thickness: There is distention of the endometrial cavity with fluid (combined thickness up to 2.4 cm) with moderate echogenic debris. No focal solid mass. The endometrial thickness is up to 10-11 mm. Right ovary Measurements: 1.7 x 2.8 x 3.2 cm = volume: 8.0 mL. Normal appearance/no adnexal mass. Left ovary Measurements: 10.7 x 11.2 x 12.4 cm = volume: 777 mL. Left ovary is not  distinctly visualized however, there is a large anechoic lesion with homogeneous echogenic debris in the left adnexa, which is favored ovarian in etiology. The structure is present since multiple prior studies. The structure exhibits mildly thick irregular wall, which exhibits normal vascularity. No discrete mural nodule seen. Pulsed Doppler evaluation of both ovaries demonstrates normal low-resistance arterial and venous waveforms. Other findings No abnormal free fluid. IMPRESSION: 1. There is a large anechoic lesion with homogeneous echogenic debris in the left adnexa, which is favored ovarian in etiology. The structure is present since multiple prior studies. The structure exhibits mildly thick irregular wall, which exhibits normal vascularity. No discrete mural nodule seen. Differential diagnosis includes endometrioma versus hemorrhagic cyst. Consider nonemergent contrast-enhanced MRI pelvis for further evaluation. 2. There is distention of the endometrial cavity with fluid (combined thickness up to 2.4 cm) with moderate echogenic debris. No focal solid mass. The endometrial thickness is up to 10-11 mm. Electronically Signed   By: Ree Molt M.D.   On: 08/27/2024 14:52   US  Art/Ven Flow Abd Pelv Doppler Result Date: 08/27/2024 CLINICAL DATA:  complex left ovarian cyst, recurrent LLQ/flank pain. EXAM: TRANSABDOMINAL AND TRANSVAGINAL ULTRASOUND OF PELVIS DOPPLER ULTRASOUND OF OVARIES TECHNIQUE: Both transabdominal and transvaginal ultrasound examinations of the pelvis were performed. Transabdominal technique  was performed for global imaging of the pelvis including uterus, ovaries, adnexal regions, and pelvic cul-de-sac. It was necessary to proceed with endovaginal exam following the transabdominal exam to visualize the uterus and endometrium. Color and duplex Doppler ultrasound was utilized to evaluate blood flow to the ovaries. COMPARISON:  CT scan renal stone protocol from earlier the same day and  ultrasound pelvis from 08/24/2024. FINDINGS: Uterus Measurements: 6.5 x 9.1 x 11.8 cm = volume: 365.8 mL. No fibroids or other mass visualized. Endometrium Thickness: There is distention of the endometrial cavity with fluid (combined thickness up to 2.4 cm) with moderate echogenic debris. No focal solid mass. The endometrial thickness is up to 10-11 mm. Right ovary Measurements: 1.7 x 2.8 x 3.2 cm = volume: 8.0 mL. Normal appearance/no adnexal mass. Left ovary Measurements: 10.7 x 11.2 x 12.4 cm = volume: 777 mL. Left ovary is not distinctly visualized however, there is a large anechoic lesion with homogeneous echogenic debris in the left adnexa, which is favored ovarian in etiology. The structure is present since multiple prior studies. The structure exhibits mildly thick irregular wall, which exhibits normal vascularity. No discrete mural nodule seen. Pulsed Doppler evaluation of both ovaries demonstrates normal low-resistance arterial and venous waveforms. Other findings No abnormal free fluid. IMPRESSION: 1. There is a large anechoic lesion with homogeneous echogenic debris in the left adnexa, which is favored ovarian in etiology. The structure is present since multiple prior studies. The structure exhibits mildly thick irregular wall, which exhibits normal vascularity. No discrete mural nodule seen. Differential diagnosis includes endometrioma versus hemorrhagic cyst. Consider nonemergent contrast-enhanced MRI pelvis for further evaluation. 2. There is distention of the endometrial cavity with fluid (combined thickness up to 2.4 cm) with moderate echogenic debris. No focal solid mass. The endometrial thickness is up to 10-11 mm. Electronically Signed   By: Ree Molt M.D.   On: 08/27/2024 14:52       Assessment & Plan:  1. Endometriosis (Primary) 2. Adnexal mass Reviwed prior intraoperative findings and that given prior surgery completd by a gynecolgist oncologist, someone considered most surgical  skilled within gynecologic field, would recommend proceeding with surgical intervention with gyn onc - reviewed that given prior intraoperative findings, surgical intervention has increased risk of bowel and bladder injury and it's sequelae including long term catheterization, bowel resection, colostomy, etc; patient expresses great desire to avoid said complications  - reviewed lupron  may decrease endometriotic burden prior to surgical intervention  - consider extent of surgical intervention desired (excision of ovarian cyst vs hysterectomy, bso with excision of endometriosis)  - lupron  may take time to take complete effect - multiple components to pain; keep upcoming pain appointment to establish new pain regimen (per PDMP last percocet rx Aug, last gabapentin  rx Sep)  - pt verbalized understanding of complexity of case and need for planned procedure with appropriate surgeon and will return to gyn onc   Carter Quarry, MD Minimally In[facility], [facility]

## 2024-09-23 ENCOUNTER — Telehealth: Payer: Self-pay | Admitting: *Deleted

## 2024-09-23 NOTE — Telephone Encounter (Signed)
 Spoke with Ms. Kristin Boyd and relayed message from [name], NP that 1-Dr. Viktoria said we will have to give the medication time to work.  It will not have an immediate effect. It takes time.  2- Dr. Viktoria said if she fails the medical treatment, could revisit the discussion around surgery but will need to give the medication time.  3- Another option Dr. Viktoria mentions is referral to [facility] who specializes in this. If pt would like this, please place referral.   Pt verbalized understanding and would like to proceed with the [facility] referral. Advised patient it may take them at least a week to contact her for an appointment. Pt thanked the office for calling.   Patient's records faxed to [facility] at [phone].

## 2024-09-23 NOTE — Telephone Encounter (Signed)
-----   Message from Eleanor JONETTA Epps sent at 09/23/2024 11:11 AM EDT ----- Dr. Viktoria said to please call the patient and let her know the following:  1-Dr. Viktoria said we will have to give the medication time to work. It will not have an immediate effect. It takes time.   2-Dr. Viktoria said if she fails medical treatment, could revisit the discussion around surgery but will need to give the medication time.   3-Another option Dr. Viktoria mentions is referral to [facility] who specialize in this. If pt would like this, please place referral.

## 2024-10-09 ENCOUNTER — Encounter (HOSPITAL_COMMUNITY): Payer: Self-pay

## 2024-10-09 ENCOUNTER — Other Ambulatory Visit: Payer: Self-pay

## 2024-10-09 ENCOUNTER — Emergency Department (HOSPITAL_COMMUNITY): Admission: EM | Admit: 2024-10-09 | Discharge: 2024-10-10 | Disposition: A | Discharge status: Home / Self Care

## 2024-10-09 DIAGNOSIS — D72829 Elevated white blood cell count, unspecified: Secondary | ICD-10-CM | POA: Diagnosis not present

## 2024-10-09 DIAGNOSIS — Z91010 Allergy to peanuts: Secondary | ICD-10-CM | POA: Diagnosis not present

## 2024-10-09 DIAGNOSIS — I10 Essential (primary) hypertension: Secondary | ICD-10-CM | POA: Diagnosis not present

## 2024-10-09 DIAGNOSIS — R102 Pelvic and perineal pain unspecified side: Secondary | ICD-10-CM | POA: Insufficient documentation

## 2024-10-09 DIAGNOSIS — R253 Fasciculation: Secondary | ICD-10-CM | POA: Diagnosis not present

## 2024-10-09 DIAGNOSIS — Z79899 Other long term (current) drug therapy: Secondary | ICD-10-CM | POA: Insufficient documentation

## 2024-10-09 DIAGNOSIS — R1030 Lower abdominal pain, unspecified: Secondary | ICD-10-CM | POA: Diagnosis present

## 2024-10-09 LAB — COMPREHENSIVE METABOLIC PANEL WITH GFR
ALT: 14 U/L (ref 0–44)
AST: 22 U/L (ref 15–41)
Albumin: 4.3 g/dL (ref 3.5–5.0)
Alkaline Phosphatase: 72 U/L (ref 38–126)
Anion gap: 12 (ref 5–15)
BUN: 14 mg/dL (ref 6–20)
CO2: 21 mmol/L — ABNORMAL LOW (ref 22–32)
Calcium: 9.1 mg/dL (ref 8.9–10.3)
Chloride: 110 mmol/L (ref 98–111)
Creatinine, Ser: 0.79 mg/dL (ref 0.44–1.00)
GFR, Estimated: >60 mL/min (ref >60)
Glucose, Bld: 103 mg/dL — ABNORMAL HIGH (ref 70–99)
Potassium: 4.1 mmol/L (ref 3.5–5.1)
Sodium: 144 mmol/L (ref 135–145)
Total Bilirubin: 0.6 mg/dL (ref 0.0–1.2)
Total Protein: 7.8 g/dL (ref 6.5–8.1)

## 2024-10-09 LAB — CBC WITH DIFFERENTIAL/PLATELET
Abs Immature Granulocytes: 0.02 K/uL (ref 0.00–0.07)
Basophils Absolute: 0.1 K/uL (ref 0.0–0.1)
Basophils Relative: 1 %
Eosinophils Absolute: 0.3 K/uL (ref 0.0–0.5)
Eosinophils Relative: 2 %
HCT: 43.3 % (ref 36.0–46.0)
Hemoglobin: 13.3 g/dL (ref 12.0–15.0)
Immature Granulocytes: 0 %
Lymphocytes Relative: 32 %
Lymphs Abs: 3.8 K/uL (ref 0.7–4.0)
MCH: 25.7 pg — ABNORMAL LOW (ref 26.0–34.0)
MCHC: 30.7 g/dL (ref 30.0–36.0)
MCV: 83.6 fL (ref 80.0–100.0)
Monocytes Absolute: 0.7 K/uL (ref 0.1–1.0)
Monocytes Relative: 6 %
Neutro Abs: 6.9 K/uL (ref 1.7–7.7)
Neutrophils Relative %: 59 %
Platelets: 320 K/uL (ref 150–400)
RBC: 5.18 MIL/uL — ABNORMAL HIGH (ref 3.87–5.11)
RDW: 15.2 % (ref 11.5–15.5)
WBC: 11.8 K/uL — ABNORMAL HIGH (ref 4.0–10.5)
nRBC: 0 % (ref 0.0–0.2)

## 2024-10-09 LAB — LIPASE, BLOOD: Lipase: 16 U/L (ref 11–51)

## 2024-10-09 LAB — CBG MONITORING, ED: Glucose-Capillary: 107 mg/dL — ABNORMAL HIGH (ref 70–99)

## 2024-10-09 LAB — HCG, SERUM, QUALITATIVE: Preg, Serum: NEGATIVE

## 2024-10-09 MED ORDER — MORPHINE SULFATE (PF) 4 MG/ML IV SOLN
4.0000 mg | Freq: Once | INTRAVENOUS | Status: AC
  Administered 2024-10-09: 4 mg via INTRAVENOUS
  Filled 2024-10-09: qty 1

## 2024-10-09 NOTE — ED Triage Notes (Signed)
 PER EMS: pt is from home with c/o lower abdominal pain x a couple of days and back pain that radiated to hips, thighs and down the side of her legs. Hx of sciatica. EMS adm 650mg  of Tylenol  for her pain.  Upon arrival to ER, EMS reports she had a possible seizure, described full body twitches and her eyes rolled back with eyelid fluttering for about 30 seconds. No hx of seizures.  BP- 160/palp, HR-94, 99% RA    Pt A&OX4 upon arrival to room.

## 2024-10-10 ENCOUNTER — Emergency Department (HOSPITAL_COMMUNITY)

## 2024-10-10 LAB — URINALYSIS, ROUTINE W REFLEX MICROSCOPIC
Bilirubin Urine: NEGATIVE
Glucose, UA: NEGATIVE mg/dL
Ketones, ur: NEGATIVE mg/dL
Leukocytes,Ua: NEGATIVE
Nitrite: NEGATIVE
Protein, ur: NEGATIVE mg/dL
Specific Gravity, Urine: 1.024 (ref 1.005–1.030)
pH: 6 (ref 5.0–8.0)

## 2024-10-10 MED ORDER — IOHEXOL 300 MG/ML  SOLN
100.0000 mL | Freq: Once | INTRAMUSCULAR | Status: AC | PRN
  Administered 2024-10-10: 100 mL via INTRAVENOUS

## 2024-10-10 MED ORDER — HYDROMORPHONE HCL 1 MG/ML IJ SOLN
1.0000 mg | Freq: Once | INTRAMUSCULAR | Status: AC
  Administered 2024-10-10: 1 mg via INTRAVENOUS
  Filled 2024-10-10: qty 1

## 2024-10-10 NOTE — ED Provider Notes (Signed)
 [place] EMERGENCY DEPARTMENT AT [facility] Provider Note   CSN: [id number] Arrival date & time: 10/09/24  2223     Patient presents with: Abdominal Pain   Kristin Boyd is a 43 y.o. female of chronic knee pain, chronic pelvic pain, endometriosis, hypertension, ovarian cysts.  Presents to ED complaining of lower abdominal pain.  Reports that earlier today started having lower abdominal pain which was thought to be a lower ovarian cyst.  She also began to have back pain radiating down her bilateral legs prompting her to call 911.  Patient was transported to the ED and in the ambulance bay the patient began having full body twitching.  Paramedics thought the patient was having a seizure however this lasted for 2 minutes and the patient was up talking and was not postictal, had no eye deviation.  Patient was seen and assessed by Dr. Jakie on arrival.  Patient denies any nausea, vomiting or diarrhea.  Denies any vaginal discharge.  Recently seen for MRI pelvis.   Abdominal Pain      Prior to Admission medications   Medication Sig Start Date End Date Taking? Authorizing Provider  acetaminophen  (TYLENOL ) 325 MG tablet Take 2 tablets (650 mg total) by mouth every 6 (six) hours as needed. Patient not taking: Reported on 08/12/2024 03/01/24   Elnor Savant A, DO  amLODipine  (NORVASC ) 10 MG tablet TAKE 1 TABLET(10 MG) BY MOUTH DAILY 10/12/23   Viktoria Comer SAUNDERS, MD  estrogens , conjugated, (PREMARIN ) 0.625 MG tablet Take 1 tablet (0.625 mg total) by mouth daily. Take daily for 21 days then do not take for 7 days. Patient not taking: Reported on 08/12/2024 07/29/23   Ervin, Michael L, MD  gabapentin  (NEURONTIN ) 100 MG capsule Take 100 mg by mouth 3 (three) times daily.    [provider]  ibuprofen  (ADVIL ) 600 MG tablet Take 1 tablet (600 mg total) by mouth every 6 (six) hours as needed. Patient not taking: Reported on 08/12/2024 03/01/24   Elnor Savant A, DO  methocarbamol   (ROBAXIN ) 500 MG tablet Take 1 tablet (500 mg total) by mouth 2 (two) times daily as needed for muscle spasms. Patient not taking: Reported on 08/12/2024 04/30/24   Neysa Caron PARAS, DO  methylPREDNISolone  (MEDROL  DOSEPAK) 4 MG TBPK tablet Use as indicated on packaging 08/27/24   Glendia Rocky SAILOR, PA-C  metroNIDAZOLE  (FLAGYL ) 500 MG tablet Take 1 tablet (500 mg total) by mouth 2 (two) times daily. Patient not taking: Reported on 08/12/2024 07/19/24   Palumbo, April, MD  naproxen  (NAPROSYN ) 500 MG tablet Take 1 tablet (500 mg total) by mouth 2 (two) times daily. 08/25/24   Bauer, Collin S, PA-C  norethindrone  (AYGESTIN ) 5 MG tablet Take 1 tablet (5 mg total) by mouth daily. 08/25/24   Viktoria Comer SAUNDERS, MD  oxyCODONE  (ROXICODONE ) 5 MG immediate release tablet Take 1 tablet (5 mg total) by mouth every 6 (six) hours as needed for severe pain (pain score 7-10). Patient not taking: Reported on 08/12/2024 03/01/24   Elnor Savant A, DO  oxyCODONE  10 MG TABS Take 1 tablet (10 mg total) by mouth every 6 (six) hours as needed for up to 20 doses for severe pain (pain score 7-10). 09/20/24   Cottie Donnice PARAS, MD  oxyCODONE -acetaminophen  (PERCOCET/ROXICET) 5-325 MG tablet Take 1 tablet by mouth every 6 (six) hours as needed for severe pain (pain score 7-10). Patient not taking: Reported on 08/12/2024 04/09/24   Randol Simmonds, MD    Allergies: Peanut (diagnostic) and  Penicillins    Review of Systems  Gastrointestinal:  Positive for abdominal pain.  All other systems reviewed and are negative.   Updated Vital Signs BP (!) 144/94   Pulse 86   Temp 98.6 F (37 C) (Oral)   Resp (!) 22   Ht 5' 9 (1.753 m)   Wt (!) 158.8 kg   SpO2 94%   BMI 51.69 kg/m   Physical Exam Vitals and nursing note reviewed.  Constitutional:      General: She is not in acute distress.    Appearance: She is well-developed.  HENT:     Head: Normocephalic and atraumatic.  Eyes:     Conjunctiva/sclera: Conjunctivae normal.  Cardiovascular:      Rate and Rhythm: Normal rate and regular rhythm.     Heart sounds: No murmur heard. Pulmonary:     Effort: Pulmonary effort is normal. No respiratory distress.     Breath sounds: Normal breath sounds.  Abdominal:     Palpations: Abdomen is soft.     Tenderness: There is abdominal tenderness.     Comments: Lower abdominal TTP  Musculoskeletal:        General: No swelling.     Cervical back: Neck supple.  Skin:    General: Skin is warm and dry.     Capillary Refill: Capillary refill takes less than 2 seconds.  Neurological:     Mental Status: She is alert and oriented to person, place, and time. Mental status is at baseline.     Comments: CN III through XII intact.  Intact finger-to-nose, heel-to-shin.  No pronator drift.  No slurred speech.  Equal strength throughout.  Equal sensation throughout.  PERRL.  Tracks cross midline.  Alert and oriented x 4.  Psychiatric:        Mood and Affect: Mood normal.     (all labs ordered are listed, but only abnormal results are displayed) Labs Reviewed  COMPREHENSIVE METABOLIC PANEL WITH GFR - Abnormal; Notable for the following components:      Result Value   CO2 21 (*)    Glucose, Bld 103 (*)    All other components within normal limits  CBC WITH DIFFERENTIAL/PLATELET - Abnormal; Notable for the following components:   WBC 11.8 (*)    RBC 5.18 (*)    MCH 25.7 (*)    All other components within normal limits  URINALYSIS, ROUTINE W REFLEX MICROSCOPIC - Abnormal; Notable for the following components:   Hgb urine dipstick SMALL (*)    Bacteria, UA RARE (*)    All other components within normal limits  CBG MONITORING, ED - Abnormal; Notable for the following components:   Glucose-Capillary 107 (*)    All other components within normal limits  LIPASE, BLOOD  HCG, SERUM, QUALITATIVE    EKG: EKG Interpretation Date/Time:  Sunday October 09 2024 22:36:10 EST Ventricular Rate:  92 PR Interval:  149 QRS Duration:  99 QT  Interval:  380 QTC Calculation: 471 R Axis:   73  Text Interpretation: Sinus rhythm Baseline wander in lead(s) III aVF Confirmed by Ula Barter [phone]) on 10/09/2024 10:51:42 PM  Radiology: CT ABDOMEN PELVIS W CONTRAST Result Date: 10/10/2024 CLINICAL DATA:  Lower abdominal pain. EXAM: CT ABDOMEN AND PELVIS WITH CONTRAST TECHNIQUE: Multidetector CT imaging of the abdomen and pelvis was performed using the standard protocol following bolus administration of intravenous contrast. RADIATION DOSE REDUCTION: This exam was performed according to the departmental dose-optimization program which includes automated exposure control, adjustment of  the mA and/or kV according to patient size and/or use of iterative reconstruction technique. CONTRAST:  OMNIPAQUE  IOHEXOL  300 MG/ML  SOLN COMPARISON:  August 27, 2024 FINDINGS: Lower chest: A 5 mm pleural based pulmonary nodule is seen within the anterolateral aspect of the right lower lobe (axial CT image 14, CT series 7). Hepatobiliary: No focal liver abnormality is seen. Surgical clips are again noted within the posterior aspect of Morrison's pouch. Status post cholecystectomy. No biliary dilatation. Pancreas: Unremarkable. No pancreatic ductal dilatation or surrounding inflammatory changes. Spleen: Normal in size without focal abnormality. Adrenals/Urinary Tract: Adrenal glands are unremarkable. Kidneys are normal in size, without obstructing renal calculi or hydronephrosis. A 5 mm calcification and adjacent focal scarring are seen within the posterior aspect of the mid to upper left kidney. Bladder is unremarkable. Stomach/Bowel: There is a small hiatal hernia. Appendix appears normal. No evidence of bowel wall thickening, distention, or inflammatory changes. Vascular/Lymphatic: No significant vascular findings are present. No enlarged abdominal or pelvic lymph nodes. Reproductive: A 9.8 cm x 12.3 cm cyst is seen extending from the anterior aspect of the left  adnexa into the mid and lower left abdomen. This is seen on multiple prior studies dating back to July 2023 and upon MR evaluation (September 13, 2024) favored to represent an endometrioma. 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 The right adnexa is unremarkable. Other: No abdominal wall hernia or abnormality. No abdominopelvic ascites. Musculoskeletal: No acute or significant osseous findings. IMPRESSION: 1. Large, stable cystic lesion within the left adnexa which corresponds to findings suggestive of an endometrioma on recent MR pelvis. 2. Stable prominence of the uterus with additional findings suggestive of hydrometra on recent MR pelvis. 3. 5 mm pleural based pulmonary nodule within the right lower lobe. Correlation with nonemergent dedicated chest CT is recommended. 4. Small hiatal hernia. 5. Evidence of prior cholecystectomy. Electronically Signed   By: Suzen Dials M.D.   On: 10/10/2024 01:01   CT Head Wo Contrast Result Date: 10/10/2024 EXAM: CT HEAD WITHOUT CONTRAST 10/10/2024 12:24:51 AM TECHNIQUE: CT of the head was performed without the administration of intravenous contrast. Automated exposure control, iterative reconstruction, and/or weight based adjustment of the mA/kV was utilized to reduce the radiation dose to as low as reasonably achievable. COMPARISON: 06/02/2024 CLINICAL HISTORY: ams FINDINGS: BRAIN AND VENTRICLES: No acute hemorrhage. No evidence of acute infarct. No hydrocephalus. No extra-axial collection. No mass effect or midline shift. Partially empty sella. ORBITS: No acute abnormality. SINUSES: No acute abnormality. SOFT TISSUES AND SKULL: No acute soft tissue abnormality. No skull fracture. IMPRESSION: 1. No acute intracranial abnormality. 2. Partially empty sella. Electronically signed by: Morene Hoard MD 10/10/2024 12:45  AM EST RP Workstation: [id number]    Procedures   Medications Ordered in the ED  morphine  (PF) 4 MG/ML injection 4 mg (4 mg Intravenous Given 10/09/24 2236)  iohexol  (OMNIPAQUE ) 300 MG/ML solution 100 mL (100 mLs Intravenous Contrast Given 10/10/24 0024)  HYDROmorphone  (DILAUDID ) injection 1 mg (1 mg Intravenous Given 10/10/24 0232)    Clinical Course as of 10/10/24 0321  Sun Oct 09, 2024  2230 Ems called out for lower abdominal pain, earlier today started having lower abdominal pain, tought lower ovarian cyst, back pain radiating down to legs, called 911. Paramedics in ambulance bay, full body twitching, paramedics thought about seizures, lasted two minutres, then up talking, no  postictal, no eye deviation.  [CG]    Clinical Course User Index [CG] Ruthell Lonni FALCON, PA-C   Medical Decision Making Amount and/or Complexity of Data Reviewed Labs: ordered. Radiology: ordered.  Risk Prescription drug management.   This is a 43 year old female presenting to the ED due to concerns of abdominal pain.  On exam, she is afebrile and nontachycardic.  Her lung sounds are clear bilaterally, no hypoxia.  Tenderness throughout the abdomen, no overlying skin change.  Neurological examination at baseline.  Alert and oriented x 4.  Assessed with CBC, CMP, urinalysis, lipase, hCG.  Also added on CT abdomen pelvis, CT head.  Given morphine  for pain.  CBC with leukocytosis 11.8, no anemia.  Metabolic panel unremarkable, no electrolyte derangement, no elevated LFT.  Urinalysis with small hemoglobin, rare bacteria but patient denies dysuria.  CT abdomen pelvis has no acute findings.  CT head is unremarkable.  At this time, patient resting comfortably in bed. Cause of symptoms most likely related to chronic pelvic pain. Patient is feeling much better after pain medication. Patient advised to follow up outpatient with OBGYN. Given strict return precautions and she voiced understanding. She is stable to  discharge home at this time.   Final diagnoses:  Pelvic pain in female    ED Discharge Orders     None          Ruthell Lonni FALCON DEVONNA 10/10/24 0321    Kristin Lamar BROCKS, MD 10/16/24 [phone]

## 2024-10-10 NOTE — ED Notes (Signed)
 Patient transported to CT

## 2024-10-10 NOTE — Discharge Instructions (Signed)
 It was a pleasure taking part in your care. Your laboratory evaluation and imaging here was reassuring. Please follow up outpatient with PCP. Please continue taking all medications as prescribed. Return to ED with any new or worsening symptoms.

## 2024-10-12 NOTE — ED Triage Notes (Addendum)
 Pt arrives via [facility] from home due to lower back pain that radiates down both legs. Has been ongoing but became worse this morning. Hx of same. Lower abdominal pain.

## 2024-10-13 ENCOUNTER — Telehealth: Payer: Self-pay | Admitting: *Deleted

## 2024-10-13 NOTE — Telephone Encounter (Signed)
 Please look at the schedule for next thurs and fri and see if there are two low risk patients or post-op patients that we could move to Melissa's schedule. Thanks!

## 2024-10-13 NOTE — Telephone Encounter (Signed)
 Spoke with Ms. Rape who states she had to go to ED yesterday because her abdominal pain was so bad. Pt reports that she has chronic back pain as well that she takes gabapentin  for.   Pt states they gave her IV toradol  and then before she left the ED they gave her oxycodone . Pt reports her pain currently is a 3/10 because she just took 2 ibuprofen  800 mg each. Pt advised and counseled that the ibuprofen  at that dose of 800mg  should only be taken every 8 hours. Pt states, It's the only thing that works for my pain and I only take it like that once a day It last 24 hours  Pt reports that her bleeding as stopped. Pt is wanting to speak with Dr. Viktoria about having surgery.   A phone visit has been scheduled with Dr. Viktoria for 11/7 Friday, at 6pm. Pt agreed to date and time and thanked the office.

## 2024-10-14 ENCOUNTER — Encounter: Payer: Self-pay | Admitting: Gynecologic Oncology

## 2024-10-14 ENCOUNTER — Inpatient Hospital Stay: Admitting: Gynecologic Oncology

## 2024-10-14 NOTE — Telephone Encounter (Signed)
 Per Dr Viktoria patient's phone appt canceled for today and in person appt scchedueld for next Friday. Patient aware

## 2024-10-19 ENCOUNTER — Telehealth: Payer: Self-pay

## 2024-10-19 NOTE — Telephone Encounter (Signed)
 Error

## 2024-10-19 NOTE — Progress Notes (Signed)
 Subjective   HPI:  Kristin Boyd is a 43 y.o.  female who presents to the office with:  Chief Complaint  Patient presents with  . Hospital Follow Up  . Gaps In Care    Dtap//decline Flu//decline Mammogram//referral   Patient presents today for follow up of her ER visit 10/12/24. She was seen for chronic bilateral low back pain with sciatica and abdominal pain. Documentation reviewed and workup reassuring. Patient reports continued pain in both areas. Her back specialist sent her in prescriptions for Lyrica and Percocet but she hasn't been able to pick them up yet. At this time she is taking Gabapentin , Ibuprofen , and Baclofen for pain. She is scheduled to see her Gyn oncologist 10/21/24 and her back specialist 11/23/24. Denies fever, chills, urinary symptoms, saddle anesthesia, vomiting.   ROS: Per HPI.  PMH: Past medical history, Past surgical history, Social history, family history were reviewed as noted in EMR.  Pertinent for:  Past Medical History:  Diagnosis Date  . Back pain   . Hypertension      Medications and allergies reviewed.   Objective    Physical Exam:  Vital Signs: BP 114/73   Pulse 91   Temp 98.1 F (36.7 C) (Temporal)   Resp 17   Ht 5' 9 (1.753 m)   Wt (!) 342 lb 12.8 oz (155.5 kg)   LMP  (Exact Date)   SpO2 99%   Breastfeeding No   BMI 50.62 kg/m   General:  Well appearing, Alert & Oriented CV:  Normal to palpation without thrill.  Regular Rate and Rhythm, No murmurs, rubs, or gallops.   Respiratory:  Normal respiratory effort.  No wheezes or crackles.  Good air movement without diminished sounds.   Extremities:  No pretibial edema  Assessment/Plan   No orders of the defined types were placed in this encounter.   Chronic bilateral low back pain with bilateral sciatica Pain poorly controlled at this time. Per chart review Dr. Rosella prescribed Lyrica 150mg  TID and #20 tabs of Percocet. I encouraged patient to pick these up from the pharmacy  when she can to help manage her symptoms better. Keep follow up with them as scheduled 11/23/24. We also discussed her depression and anxiety related to her chronic pain, and she declines additional resources at this time. She states she has a very strong faith and comforting family. If patient expresses interest in the future, can consider Duloxetine for chronic pain.   Endometriosis Keep appointment scheduled 10/21/24 to discuss next steps in management.    No follow-ups on file.  Future Appointments  Date Time Provider Department Center  11/23/2024  8:00 AM Janelle CHRISTELLA Mylinda Forest, NP [facility] None    Medications at end of visit today: Current Medications[1]       [1]  Current Outpatient Medications:  .  amLODIPine  besylate (NORVASC ) 10 mg tablet, Take one tablet (10 mg dose) by mouth daily., Disp: 90 tablet, Rfl: 1 .  baclofen (LIORESAL) 10 mg tablet, Take one tablet (10 mg dose) by mouth 3 (three) times a day as needed (back pain)., Disp: 30 tablet, Rfl: 0 .  cyclobenzaprine  (FLEXERIL ) 10 mg tablet, Take one tablet (10 mg dose) by mouth 2 (two) times a day as needed., Disp: , Rfl:  .  ibuprofen  (ADVIL ,MOTRIN ) 800 mg tablet, Take one tablet (800 mg dose) by mouth every 8 (eight) hours as needed for Pain., Disp: 30 tablet, Rfl: 1 .  medroxyPROGESTERone  (PROVERA ) 5 MG tablet, Take one tablet (  5 mg dose) by mouth daily., Disp: , Rfl:  .  oxyCODONE -acetaminophen  (PERCOCET) 7.5-325 MG per tablet, Take one tablet by mouth every 6 (six) hours as needed for Pain for up to 5 days. Max Daily Amount: 4 tablets, Disp: 20 tablet, Rfl: 0 .  pregabalin (LYRICA) 150 mg capsule, Take one capsule (150 mg dose) by mouth 3 (three) times a day for 30 days. Max Daily Amount: 450 mg, Disp: 90 capsule, Rfl: 0

## 2024-10-21 ENCOUNTER — Inpatient Hospital Stay: Attending: Gynecologic Oncology | Admitting: Gynecologic Oncology

## 2024-10-21 ENCOUNTER — Encounter: Payer: Self-pay | Admitting: Gynecologic Oncology

## 2024-10-21 VITALS — BP 136/74 | HR 74 | Temp 98.4°F | Resp 19 | Wt 337.8 lb

## 2024-10-21 DIAGNOSIS — N809 Endometriosis, unspecified: Secondary | ICD-10-CM | POA: Insufficient documentation

## 2024-10-21 DIAGNOSIS — M549 Dorsalgia, unspecified: Secondary | ICD-10-CM | POA: Insufficient documentation

## 2024-10-21 DIAGNOSIS — F17210 Nicotine dependence, cigarettes, uncomplicated: Secondary | ICD-10-CM | POA: Diagnosis not present

## 2024-10-21 DIAGNOSIS — R9389 Abnormal findings on diagnostic imaging of other specified body structures: Secondary | ICD-10-CM | POA: Diagnosis not present

## 2024-10-21 DIAGNOSIS — N9489 Other specified conditions associated with female genital organs and menstrual cycle: Secondary | ICD-10-CM | POA: Insufficient documentation

## 2024-10-21 DIAGNOSIS — Z7189 Other specified counseling: Secondary | ICD-10-CM

## 2024-10-21 DIAGNOSIS — Z6841 Body Mass Index (BMI) 40.0 and over, adult: Secondary | ICD-10-CM | POA: Diagnosis not present

## 2024-10-21 DIAGNOSIS — N736 Female pelvic peritoneal adhesions (postinfective): Secondary | ICD-10-CM | POA: Insufficient documentation

## 2024-10-21 DIAGNOSIS — R109 Unspecified abdominal pain: Secondary | ICD-10-CM | POA: Insufficient documentation

## 2024-10-21 NOTE — Progress Notes (Signed)
 Gynecologic Oncology Return Clinic Visit  10/21/24  Reason for Visit: follow-up  Treatment History: Patient's history is notable for an endometrial ablation at the age of 43.  She describes her periods as being quite heavy at this time and occurring sometimes multiple times a month.  She then had intermittent pain after the ablation but then in 2019 started to have progressive pain.  She ultimately underwent imaging with CT scan and pelvic ultrasound in August 2020 which showed an 8.3 x 6.6 x 8.9 cm complex cystic and solid left adnexal mass.  Follow-up ultrasound showed a complex and cystic lesion in the left adnexa with a tubular shape measuring 12.6 x 5.6 x 7.4 cm; mass on imaging was felt to perhaps represent a tubo-ovarian abscess.  CA125 was obtained at that time and was mildly elevated at 51.  The plan at that time was for referral to GYN oncology, which did not appear to have happened.     She then had multiple visits in the emergency department with persistence of the mass noted.  Most recently, she had a consultation with Dr. Court at [facility] with a plan to proceed with surgical excision.  Most recent imaging by ultrasound in mid October showed a left adnexal mass measuring 10.3 x 11.4 x 11.2 cm with associated septation and vascularity although poorly visualized.  Patient was seen in our emergency department on the fifth of this month.  She underwent CT and pelvic ultrasound which show persistent mass without significant change in size since most recent ultrasound.     11/12/2021: Diagnostic surgery for a complex adnexal mass and thickened endometrium.  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   She was on Orilissa  for some time with good relief but unfortunately this stopped being covered by her insurance.  She then tried norethindrone  (didn't provide much  relief).    See in the ED on 2/13 with pain.  Pelvic ultrasound at that time showed complex hypoechoic structure in the left ovary measuring 7.7 x 6.9 x 8.6, possible hemorrhagic cyst or endometrioma.  Uterine fibroid also noted.  Mildly thickened endometrium.   Started Lupron  02/26/23. Had significant improvement in her abdominal pain and repeat ultrasound in August 2024 showed decrease size of left ovarian cystic lesion from 8.6 to 6.6 cm.  Seen by OB/GYN around this time and was started on estrogen for add back therapy.  She did not have any ED visit from 01/2023 to 01/2024 for either abdominal or back pain. Last Lupron  was on 06/19/23.  Patient started therapy for back pain in September 2024, did not follow-up for further sessions.  Patient lost insurance.  Since February 2025, she has had 10 emergency department visits for back pain and 8 for abdominal pain.  She has had an additional 3 visits for vaginal bleeding.  Reestablished care with me in mid September.  Given change in insurance, we discussed restarting Lupron  as she had had good relief with this medication.  We discussed doing this through her OB/GYN's office versus our clinic and adding Aygestin  for add back therapy.  We also discussed her vaginal bleeding, suggestive of anovulatory bleeding.  Given known cervical stenosis and inability to sample her endometrium several years ago, discussed either attempted endometrial biopsy versus getting an MRI to better evaluate the lining of her uterus.  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  No discrete uterine myometrial lesion seen, no enhancing component within the endometrium.  Right ovary appears normal.  There is a 9.3 x 11.4 cm hyperintense lesion within the left adnexa exhibiting T2 bleeding without normal left parenchyma noted.  Lesion  does not exhibit enhancement on postcontrast images, no mural nodularity.  Along the anterior aspect of the lesion is a nonenhancing dilated fallopian tube.  08/2024: NILM pap, HR HPV neg  09/14/2024: Lupron  injection  Interval History: Patient comes in today to discuss possible surgical intervention.  She continues to struggle with abdominal and back pain.  Describes back pain as constant with pain that sometimes radiates down 1 or both legs.  Her abdominal pain is present daily but does not occur for the whole day.  Sometimes takes ibuprofen  when pain gets worse.  Has a prescription for oxycodone  at the pharmacy but cannot afford to get it currently.  When the pain hits she often feels like she was hit with a metal bat.  Since starting oral Aygestin , has not had any further vaginal bleeding.  Does not think there has been any change in her abdominal pain since restarting Lupron  last month.  She endorses normal bowel function.  Denies any pain with defecation or bleeding with bowel movements.  Denies any urinary symptoms.  Past Medical/Surgical History: Past Medical History:  Diagnosis Date   Arthritis    Chronic knee pain    Chronic pelvic pain in female    Endometriosis    Hypertension    Kidney stones    Obese    Ovarian cyst    Pneumonia due to COVID-19 virus 05/26/2019   Postoperative state 11/12/2021   Vaginal Pap smear, abnormal     Past Surgical History:  Procedure Laterality Date   CESAREAN SECTION     CHOLECYSTECTOMY     DILATION AND CURETTAGE OF UTERUS N/A 11/12/2021   Procedure: ATTEMPTED HYSTEROSCOPY;  Surgeon: Viktoria Comer SAUNDERS, MD;  Location: [facility];  Service: Gynecology;  Laterality: N/A;   ENDOMETRIAL ABLATION     WISDOM TOOTH EXTRACTION     2 removed   XI ROBOTIC ASSISTED SALPINGECTOMY N/A 11/12/2021   Procedure: DIAGNOSTIC LAPAROSCOPY, PERITONEAL BIOPSIES;  Surgeon: Viktoria Comer SAUNDERS, MD;  Location: [facility];  Service: Gynecology;  Laterality: N/A;     Family History  Problem Relation Age of Onset   Hypertension Father    Diabetes Father    Colon cancer Neg Hx    Breast cancer Neg Hx    Ovarian cancer Neg Hx    Endometrial cancer Neg Hx    Pancreatic cancer Neg Hx    Prostate cancer Neg Hx     Social History   Socioeconomic History   Marital status: Widowed    Spouse name: Not on file   Number of children: 2   Years of education: Not on file   Highest education level: Not on file  Occupational History   Not on file  Tobacco Use   Smoking status: Every Day    Types: Cigarettes   Smokeless tobacco: Never  Vaping Use   Vaping status: Never Used  Substance and Sexual Activity   Alcohol use: Yes    Comment: occasional   Drug use: Never   Sexual activity: Not Currently    Birth control/protection: Surgical  Other Topics Concern   Not on file  Social History Narrative   Not on  file   Social Drivers of Health   Financial Resource Strain: Low Risk  (07/06/2024)   Received from [facility]   Overall Financial Resource Strain (CARDIA)    How hard is it for you to pay for the very basics like food, housing, medical care, and heating?: Not very hard  Food Insecurity: No Food Insecurity (07/06/2024)   Received from [facility]   Hunger Vital Sign    Within the past 12 months, you worried that your food would run out before you got the money to buy more.: Never true    Within the past 12 months, the food you bought just didn't last and you didn't have money to get more.: Never true  Transportation Needs: No Transportation Needs (07/06/2024)   Received from [facility] - Transportation    In the past 12 months, has lack of transportation kept you from medical appointments or from getting medications?: No    In the past 12 months, has lack of transportation kept you from meetings, work, or from getting things needed for daily living?: No  Physical Activity: Insufficiently Active (07/06/2024)   Received from  [facility]   Exercise Vital Sign    On average, how many days per week do you engage in moderate to strenuous exercise (like a brisk walk)?: 2 days    On average, how many minutes do you engage in exercise at this level?: 10 min  Stress: No Stress Concern Present (07/06/2024)   Received from [facility] - Occupational Stress Questionnaire    Do you feel stress - tense, restless, nervous, or anxious, or unable to sleep at night because your mind is troubled all the time - these days?: Not at all  Social Connections: Socially Integrated (07/06/2024)   Received from [facility]   Social Network    How would you rate your social network (family, work, friends)?: Good participation with social networks    Current Medications:  Current Outpatient Medications:    amLODipine  (NORVASC ) 10 MG tablet, TAKE 1 TABLET(10 MG) BY MOUTH DAILY, Disp: 30 tablet, Rfl: 0   gabapentin  (NEURONTIN ) 100 MG capsule, Take 100 mg by mouth 3 (three) times daily., Disp: , Rfl:    methocarbamol  (ROBAXIN ) 500 MG tablet, Take 1 tablet (500 mg total) by mouth 2 (two) times daily as needed for muscle spasms., Disp: 14 tablet, Rfl: 0   norethindrone  (AYGESTIN ) 5 MG tablet, Take 1 tablet (5 mg total) by mouth daily., Disp: 30 tablet, Rfl: 3   pregabalin (LYRICA) 150 MG capsule, Take 150 mg by mouth., Disp: , Rfl:    oxyCODONE  10 MG TABS, Take 1 tablet (10 mg total) by mouth every 6 (six) hours as needed for up to 20 doses for severe pain (pain score 7-10)., Disp: 20 tablet, Rfl: 0   oxyCODONE -acetaminophen  (PERCOCET/ROXICET) 5-325 MG tablet, Take 1 tablet by mouth every 6 (six) hours as needed for severe pain (pain score 7-10). (Patient not taking: Reported on 08/12/2024), Disp: 10 tablet, Rfl: 0  Review of Systems: Denies appetite changes, fevers, chills, fatigue, unexplained weight changes. Denies hearing loss, neck lumps or masses, mouth sores, ringing in ears or voice  changes. Denies cough or wheezing.  Denies shortness of breath. Denies chest pain or palpitations. Denies leg swelling. Denies abdominal distention, blood in stools, constipation, diarrhea, nausea, vomiting, or early satiety. Denies pain with intercourse, dysuria, frequency, hematuria or incontinence. Denies hot flashes, pelvic pain, vaginal  bleeding or vaginal discharge.   Denies itching, rash, or wounds. Denies dizziness, headaches, numbness or seizures. Denies swollen lymph nodes or glands, denies easy bruising or bleeding. Denies anxiety, depression, confusion, or decreased concentration.  Physical Exam: BP 136/74 (BP Location: Right Arm, Patient Position: Sitting)   Pulse 74   Temp 98.4 F (36.9 C) (Oral)   Resp 19   Wt (!) 337 lb 12.8 oz (153.2 kg)   SpO2 94%   BMI 49.88 kg/m  General: Alert, oriented, no acute distress. HEENT: Posterior oropharynx clear, sclera anicteric. Chest: Clear to auscultation bilaterally.  No wheezes or rhonchi. Cardiovascular: Regular rate and rhythm, no murmurs. Abdomen: Obese, soft, nontender.  Normoactive bowel sounds.  No masses or hepatosplenomegaly appreciated.   Extremities: Grossly normal range of motion.  Warm, well perfused.  No edema bilaterally. Skin: No rashes or lesions noted. Lymphatics: No cervical, supraclavicular, or inguinal adenopathy. GU: Normal appearing external genitalia without erythema, excoriation, or lesions.  Speculum exam reveals normal-appearing cervix.  Bimanual exam is somewhat limited secondary to body habitus.  There is a mass that moves in conjunction with the uterus which is minimally-moderately mobile.  On rectovaginal exam, no tethering of the rectum itself.  Laboratory & Radiologic Studies: None new  Assessment & Plan: Kristin Boyd is a 43 y.o. woman with Stage IV endometriosis who presents for treatment planning.  The patient is overall doing well although continues to struggle with significant  abdominal and back pain.  She is seeking treatment for back pain.  We restarted her on suppression for her endometriosis about a month and a half ago.  She has not had any improvement in her pain but with addition of Aygestin  to Lupron , her vaginal bleeding has stopped.  She has had many visits in the last 8 months to the emergency department and becomes tearful when discussing her abdominal pain.  Given her multiple pain symptoms, she is tired of spending so much time in pain and would like to find some relief.  We spent quite some time discussing endometriosis diagnosis and typical treatment.  She had previously had good relief on suppression with Lupron .  Although this was only recently restarted, she has not felt any decrease in her abdominal pain.  We reviewed that for patients who fail medical management or fail to achieve sufficient control of their symptoms, surgery can be considered for definitive management.  While surgery may help alleviate some of her symptoms, I stressed that I think she may continue to have some abdominal pain and may not see much change in her back pain.  We also reviewed morbidity of surgery in the setting of her extensive endometriosis as well as her medical comorbidities, notably her BMI just under 50.  Given findings at the time of her diagnostic laparoscopy with me several years ago, surgery would require an open approach and may require bowel surgery.  We discussed that this could mean bowel resection and reanastomosis and that there is a risk of temporary or permanent ostomy.  She does not have GI symptoms that are suggestive of deeply infiltrative disease and on MRI, this was not evident.  If we are moving forward with surgery here at [facility], I discussed that I will reach out to our colorectal surgeons.  They may recommend colonoscopy before surgery, which she was amenable to having done.  The patient is scheduled to see one of the MIGS providers at [facility].  I may  reach out to get her thoughts as  his appointment is not scheduled until February.  In terms of timing of surgery, I offered the patient the option of having surgery at [facility] as early as 12/9, with me here at [facility] on Christmas Eve, or at [place] in mid January.  Her preference was to get through the holidays and her birthday and have surgery in mid January.  In terms of surgery, we discussed recommendation for total abdominal hysterectomy given her abnormal uterine bleeding, which I suspect is anovulatory but inability to sample her endometrium due to prior ablation.  We could proceed with unilateral oophorectomy and bilateral salpingectomy if the right ovary is normal in appearance or proceed with BSO.  We reviewed the role that estrogen plays in the endometriosis disease process.  If the patient decides to proceed with BSO, which given her degree of symptomatology I think would be very reasonable, we discussed option of estrogen replacement therapy.  Given timing, patient was offered in person or phone preoperative visit to review surgery and perioperative details.  She prefers to return in person.  The patient voices understanding that weight loss between now and surgery would help decrease the morbidity of surgery.  She would be open to weight loss medication such as GLP-1 agonist.  I have asked her to reach out to her PCP and I will also send a message.  45 minutes of total time was spent for this patient encounter, including preparation, face-to-face counseling with the patient and coordination of care, and documentation of the encounter.  Comer Dollar, MD  Division of Gynecologic Oncology  Department of Obstetrics and Gynecology  [facility]

## 2024-10-21 NOTE — Patient Instructions (Signed)
 We will tentatively plan for surgery at [facility] with Dr. Comer Dollar tentatively on December 22, 2023. We will see you back in the office closer to the date for a preop appointment with Dr. Dollar and [name] NP to discuss the instructions for before and after surgery.  You may also receive a phone call from the hospital to arrange for a pre-op appointment there as well. Usually both appointments can be combined on the same day.

## 2024-10-26 NOTE — Progress Notes (Signed)
 [facility] Video Visit   Patient ID:  Kristin Boyd is a 43 y.o. (DOB September 07, 1981) female Place of service: patient home Patient has been advised as to the limitations and limited nature of physical exam due to nature of a video visit, the possibility of privacy risk in the use of a video visit, and that the healthcare provider may recommend visiting a healthcare clinic for in-person care and follow up.   Video Visit Assessment and Plan   1. Morbid obesity with BMI of 50.0-59.9, adult (*) (Primary) Assessment & Plan: We discussed the importance of a healthy well balanced diet and regular exercise as the foundation of exercise. We also discussed weight loss medications and patient does not feel comfortable with GLP-1 therapy. She is agreeable to a [facility] referral for consultation. Order placed. Await their recommendations.  Orders: -     Ambulatory Referral to [facility]; Future     Current Medications[1]   Risk, benefits, and alternatives were provided through patient instructions given to the patient electronically and during the video interaction.  If any worsening symptoms or lack of improvement, the patient will seek immediate medical care.   Video Visit History  No chief complaint on file.    Patient presents to discuss weight management. She was seen by her surgical gynecologist 10/21/24 and they plan to move forward with surgery for her endometriosis. Her surgeon strongly recommended weight loss prior to surgery to decrease complications. She encouraged patient to discuss weight loss medication with her PCP. Patient states she can work on her diet but is unable to exercise much due to her back pain and abdominal pain. She has never been on weight loss medication before. She has a mild fear of needles and would not feel comfortable with injectable weight loss medication.    Reviewed and updated this visit by provider: None        ROS:  As documented in the history  above, all other relevant system complaints were negative.    Video Visit Objective Findings   Examination conducted with the use of video cameras/computer monitors. Vital signs and other aspects of physical exam are limited due to the nature of this encounter.   Constitutional:  No apparent acute distress noted during the video interaction; Alert and oriented with normal mentation and verbally interactive. Mood:  Appears appropriate to situation.        [1]  Outpatient Encounter Medications as of 10/26/2024:  .  amLODIPine  besylate (NORVASC ) 10 mg tablet, Take one tablet (10 mg dose) by mouth daily. .  baclofen (LIORESAL) 10 mg tablet, Take one tablet (10 mg dose) by mouth 3 (three) times a day as needed (back pain). .  cyclobenzaprine  (FLEXERIL ) 10 mg tablet, Take one tablet (10 mg dose) by mouth 2 (two) times a day as needed. .  ibuprofen  (ADVIL ,MOTRIN ) 800 mg tablet, Take one tablet (800 mg dose) by mouth every 8 (eight) hours as needed for Pain. .  medroxyPROGESTERone  (PROVERA ) 5 MG tablet, Take one tablet (5 mg dose) by mouth daily. .  pregabalin (LYRICA) 150 mg capsule, Take one capsule (150 mg dose) by mouth 3 (three) times a day for 30 days. Max Daily Amount: 450 mg

## 2024-10-28 ENCOUNTER — Other Ambulatory Visit: Payer: Self-pay | Admitting: Oncology

## 2024-10-28 ENCOUNTER — Other Ambulatory Visit: Payer: Self-pay | Admitting: Gynecologic Oncology

## 2024-10-28 ENCOUNTER — Telehealth: Payer: Self-pay | Admitting: Oncology

## 2024-10-28 ENCOUNTER — Encounter: Payer: Self-pay | Admitting: Oncology

## 2024-10-28 DIAGNOSIS — N809 Endometriosis, unspecified: Secondary | ICD-10-CM

## 2024-10-28 DIAGNOSIS — N9489 Other specified conditions associated with female genital organs and menstrual cycle: Secondary | ICD-10-CM

## 2024-10-28 NOTE — Telephone Encounter (Signed)
 Called Tiann back and advised her to contact her spine doctor for the pain medication refill.  Discussed that we can refer her to a pain clinic if needed.

## 2024-10-28 NOTE — Progress Notes (Signed)
 Called [facility] an a consult was scheduled with Alan Needs, PA on 11/02/2024 at 8:40.  Called Dorita and let her know that Dr. Viktoria talked to the colorectal surgeons and they are recommending a colonoscopy prior to surgery in January.  Advised her of appointment at [facility] on 11/02/2024 at 8:40. Dorita verbalized understanding and agreement.  She also said she is having pain in her pelvis and the right side of her stomach where the cyst is.  She is taking Ibuprofen  800 mg and oxycodone /acetaminophen  7.5/325 mg that prescribed by her spine doctor.  She got the prescription on 10/14/24 and was given 20 tablets.  She has 1 1/2 tablets left and said that it and the ibuprofen  are the only medications that help with the pain.  She is asking if a refill of the oxycodone /acetaminophen  can be sent to the [facility] in [place].

## 2024-10-31 ENCOUNTER — Emergency Department (HOSPITAL_COMMUNITY)
Admission: EM | Admit: 2024-10-31 | Discharge: 2024-10-31 | Disposition: A | Discharge status: Home / Self Care | Attending: Emergency Medicine | Admitting: Emergency Medicine

## 2024-10-31 ENCOUNTER — Other Ambulatory Visit: Payer: Self-pay

## 2024-10-31 ENCOUNTER — Encounter (HOSPITAL_COMMUNITY): Payer: Self-pay

## 2024-10-31 DIAGNOSIS — R109 Unspecified abdominal pain: Secondary | ICD-10-CM | POA: Insufficient documentation

## 2024-10-31 DIAGNOSIS — R1900 Intra-abdominal and pelvic swelling, mass and lump, unspecified site: Secondary | ICD-10-CM | POA: Insufficient documentation

## 2024-10-31 DIAGNOSIS — G8929 Other chronic pain: Secondary | ICD-10-CM | POA: Diagnosis not present

## 2024-10-31 MED ORDER — LIDOCAINE 5 % EX PTCH: 1.0000 | MEDICATED_PATCH | CUTANEOUS | 0 refills | Status: AC

## 2024-10-31 MED ORDER — HYDROMORPHONE HCL 1 MG/ML IJ SOLN
1.0000 mg | Freq: Once | INTRAMUSCULAR | Status: AC
  Administered 2024-10-31: 1 mg via INTRAVENOUS
  Filled 2024-10-31: qty 1

## 2024-10-31 MED ORDER — LIDOCAINE 5 % EX PTCH
1.0000 | MEDICATED_PATCH | CUTANEOUS | Status: DC
  Administered 2024-10-31: 1 via TRANSDERMAL
  Filled 2024-10-31: qty 1

## 2024-10-31 NOTE — ED Provider Notes (Signed)
 [place] EMERGENCY DEPARTMENT AT [facility] Provider Note   CSN: [id number] Arrival date & time: 10/31/24  9845     Patient presents with: Abdominal Pain and Back Pain   Kristin Boyd is a 43 y.o. female.   The history is provided by the patient and medical records.  Abdominal Pain Back Pain Associated symptoms: abdominal pain    43 year old female presenting to the ED for back and abdominal pain.  She has been having issues for the past several months related to an ovarian mass.  This is being followed closely by GYN oncology and is scheduled for resection mid January 2026.  She denies any new symptoms today, just having uncontrolled pain.  Denies nausea, vomiting, fever, chills, sweats.  She states her vaginal bleeding is better after re-start Lupron .  States she did take 2 Tylenol  at home as well as her usual daily medications but has not had any relief.  She received 200mcg fentanyl  en route.  No numbness/weakness of the legs.  No bowel or bladder incontinence.  Prior to Admission medications   Medication Sig Start Date End Date Taking? Authorizing Provider  lidocaine  (LIDODERM ) 5 % Place 1 patch onto the skin daily. Remove & Discard patch within 12 hours or as directed by MD 10/31/24  Yes Jarold Olam HERO, PA-C  amLODipine  (NORVASC ) 10 MG tablet TAKE 1 TABLET(10 MG) BY MOUTH DAILY 10/12/23   Viktoria Comer SAUNDERS, MD  gabapentin  (NEURONTIN ) 100 MG capsule Take 100 mg by mouth 3 (three) times daily.    [provider]  methocarbamol  (ROBAXIN ) 500 MG tablet Take 1 tablet (500 mg total) by mouth 2 (two) times daily as needed for muscle spasms. 04/30/24   Neysa Caron PARAS, DO  norethindrone  (AYGESTIN ) 5 MG tablet Take 1 tablet (5 mg total) by mouth daily. 08/25/24   Viktoria Comer SAUNDERS, MD  oxyCODONE -acetaminophen  (PERCOCET) 7.5-325 MG tablet Take by mouth. 10/14/24   [provider]  pregabalin (LYRICA) 150 MG capsule Take 150 mg by mouth. 10/14/24 11/13/24   [provider]    Allergies: Peanut (diagnostic) and Penicillins    Review of Systems  Gastrointestinal:  Positive for abdominal pain.  Musculoskeletal:  Positive for back pain.  All other systems reviewed and are negative.   Updated Vital Signs BP (!) 149/85   Pulse 97   Temp 98 F (36.7 C) (Oral)   Resp 19   SpO2 93%   Physical Exam Vitals and nursing note reviewed.  Constitutional:      Appearance: She is well-developed.  HENT:     Head: Normocephalic and atraumatic.  Eyes:     Conjunctiva/sclera: Conjunctivae normal.     Pupils: Pupils are equal, round, and reactive to light.  Cardiovascular:     Rate and Rhythm: Normal rate and regular rhythm.     Heart sounds: Normal heart sounds.  Pulmonary:     Effort: Pulmonary effort is normal.     Breath sounds: Normal breath sounds.  Abdominal:     General: Bowel sounds are normal.     Palpations: Abdomen is soft.     Tenderness: There is no abdominal tenderness. There is no guarding or rebound.     Comments: Soft, non-tender, no peritoneal signs  Musculoskeletal:        General: Normal range of motion.     Cervical back: Normal range of motion.     Comments: Moving extremities well, no focal deficit  Skin:    General: Skin is  warm and dry.  Neurological:     Mental Status: She is alert and oriented to person, place, and time.     (all labs ordered are listed, but only abnormal results are displayed) Labs Reviewed - No data to display  EKG: None  Radiology: No results found.   Procedures   Medications Ordered in the ED  lidocaine  (LIDODERM ) 5 % 1 patch (1 patch Transdermal Patch Applied 10/31/24 0317)  HYDROmorphone  (DILAUDID ) injection 1 mg (1 mg Intravenous Given 10/31/24 0221)                                    Medical Decision Making Risk Prescription drug management.   43 year old female here with back and abdominal pain.  This has been a chronic issue for her over the past several  months related to ovarian mass.  This is being followed by GYN oncology and is scheduled for resection in January 2026.  Denies any new symptoms today, rather uncontrolled pain.  She is afebrile and nontoxic in appearance here.  Her vitals are stable.  Abdominal exam is benign without any peritoneal signs.  She is not having any ongoing bleeding after starting Lupron  again.  Clinically, seems to be exacerbation of her chronic pain.  She denies any new symptoms today.  She has had numerous imaging studies/CT's over the past few months, had CT earlier this month as well.  Her pelvis mass is known and being closely followed outpatient.  She does not have a surgical abdomen here, do not feel repeat labs/imaging will change overall management.  Offered her one-time dose of medication here for pain control but strongly encouraged GYN/PCP follow-up for ongoing management of this.  PDMP reviewed-- several narcotic prescriptions over the past few months, all from different providers.  Most recent script 10/14/24.  Final diagnoses:  Chronic abdominal pain  Pelvic mass    ED Discharge Orders          Ordered    lidocaine  (LIDODERM ) 5 %  Every 24 hours        10/31/24 0314               Jarold Olam HERO, PA-C 10/31/24 0402    Emil Share, DO 10/31/24 [phone]

## 2024-10-31 NOTE — ED Triage Notes (Signed)
 Pt BIBA from home, c/o abdominal pain and back pain starting at 8pm and got worse around midnight, pain radiates to both legs. hx of herniated disc, sciatica, endometriosis, ovarian cyst  200 mcg fentanyl  20G LAC   BP 200/90 160/81 HR RR O2 CBG 106

## 2024-10-31 NOTE — Discharge Instructions (Addendum)
 You will need to follow-up with your primary care doctor and/or Gynecologist for ongoing management of your symptoms. Return here for new concerns.

## 2024-11-01 NOTE — Progress Notes (Unsigned)
 11/02/2024 Kristin Boyd [id number] [birth year]  Referring provider: Micheline Eleanor BIRCH, NP Primary GI doctor: Dr. Suzann  ASSESSMENT AND PLAN:  Abdominal pain secondary to stage IV endometriosis with infiltrative disease seen on exploratory laparotomy 2022, planned resection January 2026 requiring colonoscopy prior to surgery 10/10/2024 CTAP W large stable cystic lesion left adnexa suggested endometrioma recent MRI pelvis 5 mm pleural pulmonary nodule, small HH No family history of colon cancer Formed stools daily, no hematochezia Scheduled Jan 14th, will reach out to the doctors to try to get scheduled in the hospital before this date due to BMI, We have discussed the risks of bleeding, infection, perforation, medication reactions, and remote risk of death associated with colonoscopy. All questions were answered and the patient acknowledges these risk and wishes to proceed.  Small hiatal hernia Status post cholecystectomy Denies GERD, dysphagia  Ovarian mass/stage IV endometriosis pelvic adhesive disease Followed closely by GYN and oncology On Lupron  for menorrhagia Scheduled resection December 21 2024 Going to pain management, will start on miralax if she starts on pain medications  Morbid obesity  Body mass index is 50.56 kg/m.  -Patient has been advised to make an attempt to improve diet and exercise patterns to aid in weight loss. -Recommended diet heavy in fruits and veggies and low in animal meats, cheeses, and dairy products, appropriate calorie intake - not on medications for weight los at this time, discussed not starting before colonoscopy, given information  Patient Care Team: Kline, Chianne, PA-C as PCP - General (Physician Assistant)  HISTORY OF PRESENT ILLNESS: 43 y.o. female with a past medical history listed below presents for evaluation of colonoscopy prior to surgery for endometriosis/adhesive disease/ovarian mass.  Discussed the use of AI scribe  software for clinical note transcription with the patient, who gave verbal consent to proceed.  History of Present Illness   Kristin Boyd is a 43 year old female with endometriosis who presents with abdominal pain and a planned colonoscopy.  She has a history of endometriosis with significant adhesions and has undergone surgery in the past. She is currently experiencing abdominal pain, lower back pain, and pain radiating down both legs. The pain sometimes originates from her back and other times from her abdomen. She has a herniated disc in her lower back and sciatica, complicating the identification of the pain source.  Her bowel habits are regular with no diarrhea, constipation, or blood in the stool. No heartburn, reflux, or swallowing difficulties. She has a history of gallbladder removal and a small hiatal hernia was noted on a CT scan, though she is asymptomatic for reflux.  She is not currently on any GLP-1 medications for weight loss but is considering oral options due to a dislike of needles. There is no known family history of colon cancer.        She  reports that she has been smoking cigarettes. She has never used smokeless tobacco. She reports current alcohol use. She reports that she does not use drugs.  RELEVANT GI HISTORY, IMAGING AND LABS: Results   RADIOLOGY Abdominal CT: Small hiatal hernia with a minor portion of the stomach protruding into the thoracic cavity.      CBC    Component Value Date/Time   WBC 11.8 (H) 10/09/2024 2230   RBC 5.18 (H) 10/09/2024 2230   HGB 13.3 10/09/2024 2230   HGB 12.9 10/23/2021 1134   HCT 43.3 10/09/2024 2230   PLT 320 10/09/2024 2230   PLT 330 10/23/2021 1134   MCV  83.6 10/09/2024 2230   MCH 25.7 (L) 10/09/2024 2230   MCHC 30.7 10/09/2024 2230   RDW 15.2 10/09/2024 2230   LYMPHSABS 3.8 10/09/2024 2230   MONOABS 0.7 10/09/2024 2230   EOSABS 0.3 10/09/2024 2230   BASOSABS 0.1 10/09/2024 2230   Recent Labs    07/18/24 1834  08/19/24 1104 08/24/24 1339 08/27/24 1042 09/20/24 1452 10/09/24 2230  HGB 13.9 13.2 13.7 12.9 14.0 13.3    CMP     Component Value Date/Time   NA 144 10/09/2024 2230   K 4.1 10/09/2024 2230   CL 110 10/09/2024 2230   CO2 21 (L) 10/09/2024 2230   GLUCOSE 103 (H) 10/09/2024 2230   BUN 14 10/09/2024 2230   CREATININE 0.79 10/09/2024 2230   CALCIUM 9.1 10/09/2024 2230   PROT 7.8 10/09/2024 2230   ALBUMIN 4.3 10/09/2024 2230   AST 22 10/09/2024 2230   ALT 14 10/09/2024 2230   ALKPHOS 72 10/09/2024 2230   BILITOT 0.6 10/09/2024 2230   GFRNONAA >60 10/09/2024 2230   GFRAA >60 05/29/2019 0340      Latest Ref Rng & Units 10/09/2024   10:30 PM 09/20/2024    2:52 PM 08/27/2024   10:42 AM  Hepatic Function  Total Protein 6.5 - 8.1 g/dL 7.8  7.9  7.5   Albumin 3.5 - 5.0 g/dL 4.3  4.3  4.1   AST 15 - 41 U/L 22  19  16    ALT 0 - 44 U/L 14  20  17    Alk Phosphatase 38 - 126 U/L 72  72  69   Total Bilirubin 0.0 - 1.2 mg/dL 0.6  1.1  0.6       Current Medications:   Current Outpatient Medications (Endocrine & Metabolic):    norethindrone  (AYGESTIN ) 5 MG tablet, Take 1 tablet (5 mg total) by mouth daily.  Current Outpatient Medications (Cardiovascular):    amLODipine  (NORVASC ) 10 MG tablet, TAKE 1 TABLET(10 MG) BY MOUTH DAILY   Current Outpatient Medications (Analgesics):    ibuprofen  (ADVIL ) 800 MG tablet, Take 800 mg by mouth 3 (three) times daily.   oxyCODONE -acetaminophen  (PERCOCET) 7.5-325 MG tablet, Take by mouth.   Current Outpatient Medications (Other):    gabapentin  (NEURONTIN ) 100 MG capsule, Take 100 mg by mouth 3 (three) times daily.   lidocaine  (LIDODERM ) 5 %, Place 1 patch onto the skin daily. Remove & Discard patch within 12 hours or as directed by MD   methocarbamol  (ROBAXIN ) 500 MG tablet, Take 1 tablet (500 mg total) by mouth 2 (two) times daily as needed for muscle spasms.   Na Sulfate-K Sulfate-Mg Sulfate concentrate (SUPREP) 17.5-3.13-1.6 GM/177ML SOLN,  Take 1 kit (354 mLs total) by mouth once for 1 dose.   pregabalin (LYRICA) 150 MG capsule, Take 150 mg by mouth.  Medical History:  Past Medical History:  Diagnosis Date   Arthritis    Chronic knee pain    Chronic pelvic pain in female    Endometriosis    Hypertension    Kidney stones    Obese    Ovarian cyst    Pneumonia due to COVID-19 virus 05/26/2019   Postoperative state 11/12/2021   Vaginal Pap smear, abnormal    Allergies:  Allergies  Allergen Reactions   Peanut (Diagnostic) Anaphylaxis   Penicillins Anaphylaxis    Has patient had a PCN reaction causing immediate rash, facial/tongue/throat swelling, SOB or lightheadedness with hypotension: Yes Has patient had a PCN reaction causing severe rash involving mucus membranes or  skin necrosis: Unknown Has patient had a PCN reaction that required hospitalization: Yes Has patient had a PCN reaction occurring within the last 10 years: Yes If all of the above answers are NO, then may proceed with Cephalosporin use.     Surgical History:  She  has a past surgical history that includes Cholecystectomy; Cesarean section; Endometrial ablation; Wisdom tooth extraction; Xi robotic assisted salpingectomy (N/A, 11/12/2021); and Dilation and curettage of uterus (N/A, 11/12/2021). Family History:  Her family history includes Diabetes in her father; Hypertension in her father.  REVIEW OF SYSTEMS  : All other systems reviewed and negative except where noted in the History of Present Illness.  PHYSICAL EXAM: BP 112/72 (BP Location: Left Arm, Patient Position: Sitting, Cuff Size: Large)   Pulse 86   Ht 5' 9 (1.753 m)   Wt (!) 342 lb 6 oz (155.3 kg)   BMI 50.56 kg/m  Physical Exam   GENERAL APPEARANCE: Well nourished, in no apparent distress. HEENT: No cervical lymphadenopathy, unremarkable thyroid, sclerae anicteric, conjunctiva pink. RESPIRATORY: Respiratory effort normal, breath sounds equal bilaterally without rales, rhonchi, or  wheezing. CARDIO: Regular rate and rhythm with no murmurs, rubs, or gallops, peripheral pulses intact. ABDOMEN: Soft, non-distended, active bowel sounds in all four quadrants, no tenderness to palpation, no rebound, no mass appreciated. RECTAL: Declines. MUSCULOSKELETAL: Full range of motion, normal gait, without edema. SKIN: Dry, intact without rashes or lesions. No jaundice. NEURO: Alert, oriented, no focal deficits. PSYCH: Cooperative, normal mood and affect. EXTREMITIES: No swelling in legs, ankles normal.      Alan JONELLE Coombs, PA-C 9:28 AM

## 2024-11-02 ENCOUNTER — Encounter: Payer: Self-pay | Admitting: Physician Assistant

## 2024-11-02 ENCOUNTER — Ambulatory Visit: Admitting: Physician Assistant

## 2024-11-02 VITALS — BP 112/72 | HR 86 | Ht 69.0 in | Wt 342.4 lb

## 2024-11-02 DIAGNOSIS — N9489 Other specified conditions associated with female genital organs and menstrual cycle: Secondary | ICD-10-CM | POA: Diagnosis not present

## 2024-11-02 DIAGNOSIS — N736 Female pelvic peritoneal adhesions (postinfective): Secondary | ICD-10-CM

## 2024-11-02 DIAGNOSIS — N809 Endometriosis, unspecified: Secondary | ICD-10-CM | POA: Diagnosis not present

## 2024-11-02 DIAGNOSIS — D259 Leiomyoma of uterus, unspecified: Secondary | ICD-10-CM | POA: Diagnosis not present

## 2024-11-02 DIAGNOSIS — Z1211 Encounter for screening for malignant neoplasm of colon: Secondary | ICD-10-CM

## 2024-11-02 DIAGNOSIS — R1030 Lower abdominal pain, unspecified: Secondary | ICD-10-CM

## 2024-11-02 DIAGNOSIS — Z6841 Body Mass Index (BMI) 40.0 and over, adult: Secondary | ICD-10-CM

## 2024-11-02 MED ORDER — NA SULFATE-K SULFATE-MG SULF 17.5-3.13-1.6 GM/177ML PO SOLN: 1.0000 | Freq: Once | ORAL | 0 refills | Status: AC

## 2024-11-02 NOTE — Patient Instructions (Addendum)
 _______________________________________________________  If your blood pressure at your visit was 140/90 or greater, please contact your primary care physician to follow up on this.  _______________________________________________________  If you are age 44 or older, your body mass index should be between 23-30. Your Body mass index is 50.56 kg/m. If this is out of the aforementioned range listed, please consider follow up with your Primary Care Provider.  If you are age 21 or younger, your body mass index should be between 19-25. Your Body mass index is 50.56 kg/m. If this is out of the aformentioned range listed, please consider follow up with your Primary Care Provider.   ________________________________________________________  The [facility] providers would like to encourage you to use MYCHART to communicate with providers for non-urgent requests or questions.  Due to long hold times on the telephone, sending your provider a message by [facility] may be a faster and more efficient way to get a response.  Please allow 48 business hours for a response.  Please remember that this is for non-urgent requests.  _______________________________________________________  [facility] is using a team-based approach to care.  Your team is made up of your doctor and two to three APPS. Our APPS (Nurse Practitioners and Physician Assistants) work with your physician to ensure care continuity for you. They are fully qualified to address your health concerns and develop a treatment plan. They communicate directly with your gastroenterologist to care for you. Seeing the Advanced Practice Practitioners on your physician's team can help you by facilitating care more promptly, often allowing for earlier appointments, access to diagnostic testing, procedures, and other specialty referrals.  You have been scheduled for a colonoscopy. Please follow written instructions given to you at your visit today.    If you use inhalers (even only as needed), please bring them with you on the day of your procedure.  DO NOT TAKE 7 DAYS PRIOR TO TEST- Trulicity (dulaglutide) Ozempic, Wegovy (semaglutide) Mounjaro, Zepbound (tirzepatide) Bydureon Bcise (exanatide extended release)  DO NOT TAKE 1 DAY PRIOR TO YOUR TEST Rybelsus (semaglutide) Adlyxin (lixisenatide) Victoza (liraglutide) Byetta (exanatide) ___________________________________________________________________________   Due to recent changes in healthcare laws, you may see the results of your imaging and laboratory studies on MyChart before your provider has had a chance to review them.  We understand that in some cases there may be results that are confusing or concerning to you. Not all laboratory results come back in the same time frame and the provider may be waiting for multiple results in order to interpret others.  Please give us  48 hours in order for your provider to thoroughly review all the results before contacting the office for clarification of your results.    Miralax is an osmotic laxative.  It only brings more water  into the stool.  This is safe to take daily.  Can take up to 17 gram of miralax twice a day.  Mix with juice or coffee.  Start 1 capful at night for 3-4 days and reassess your response in 3-4 days.  You can increase and decrease the dose based on your response.  Remember, it can take up to 3-4 days to take effect OR for the effects to wear off.   I often pair this with benefiber in the morning to help assure the stool is not too loose.   Understanding Your Weekly GLP-1   A helpful guide to managing common side effects  You are on a once-weekly injectable medication in the GLP-1 receptor agonist class. These  medications can be very effective for blood sugar control, weight loss, and heart protection, fatty liver or OSA, but they can also come with some side effects that are important to understand. The good  news is: most side effects can be managed with a few adjustments.  1. Gastroparesis-Like Symptoms These medications slow down your stomach to help you feel full longer -- great for weight loss and blood sugar control, but they can sometimes cause symptoms that feel like gastroparesis (slow stomach emptying). Symptoms may include: -Feeling full quickly when eating -Nausea or vomiting -Bloating or abdominal discomfort -Worsening heartburn or reflux -Acid regurgitation -Stomach spasms or tightness What you can do: ??? Eat small, frequent meals (4-6 per day) ?? Drink fluids between meals, not during ?? Avoid high-fiber foods (like raw veggies or whole grains); cook your veggies well ?? Spread protein throughout the day (try Greek yogurt, eggs, soft meats, Glucerna, milk) ?? Choose soft foods you can mash with a fork ?? Switch to pured foods or liquids during flare-ups ?? Consider reading: Living Well with Gastroparesis by Camelia Bone ?? Downloadable Diet Guide: [facility] Gastroparesis Diet PDF  ?? Tip: Try following a gastroparesis-friendly diet on the day of your injection and the day after, when the medication's effect is strongest.  2. Constipation Since this medication slows down your digestive system, constipation is very common. Tips to help: ?? Drink plenty of water  ???? Stay active with regular exercise ?? Add fiber-rich but gentle foods like kiwi ?? Try a low-dose magnesium supplement at night ?? Use MiraLAX (half to one capful daily) if constipation becomes more frequent, especially if your dose increases  If these strategies don't help, talk to your provider -- they may recommend or prescribe other treatments.  3. When to Call the Doctor or Go to the ER While rare, this medication can slightly increase your risk of serious conditions like: Pancreatitis (inflammation of the pancreas) Gallstones or gallbladder problems Watch for these signs and seek help if  you experience: Severe abdominal pain (especially in the upper belly or that radiates to your back) Pain in the right upper side of your abdomen Nausea, vomiting, fever, or chills that don't go away ?? Call your provider or go to the ER if these occur.  4. Who Should NOT Take This Medication? This medication should be avoided if you have: A personal history of pancreatitis  A personal or family history of medullary thyroid cancer A condition called Multiple Endocrine Neoplasia Syndrome Type 2 (MEN2)  Final Note If the side effects are too bothersome, remember: Most symptoms will go away if you stop the medication. But many people tolerate it well after the first few weeks, especially with the right strategies in place.

## 2024-11-29 ENCOUNTER — Other Ambulatory Visit: Payer: Self-pay

## 2024-11-29 ENCOUNTER — Encounter (HOSPITAL_COMMUNITY): Payer: Self-pay | Admitting: Pediatrics

## 2024-11-29 NOTE — Progress Notes (Addendum)
 Anesthesia Review:  ERE:Xopwz LOV 11/22/24  Cardiologist :  PPM/ ICD: Device Orders: Rep Notified:  Chest x-ray : EKG : 10/11/2024  Echo : Stress test: Cardiac Cath :   Activity level:  Sleep Study/ CPAP : Fasting Blood Sugar :      / Checks Blood Sugar -- times a day:    Blood Thinner/ Instructions /Last Dose: ASA / Instructions/ Last Dose :    10/12/24- labs

## 2024-12-05 ENCOUNTER — Telehealth: Payer: Self-pay

## 2024-12-05 NOTE — Telephone Encounter (Signed)
 Procedure:COLON Procedure date: 12/14/23 Procedure location: [facility] Arrival Time: 7:07 Spoke with the patient Y/N: N Any prep concerns? N Has the patient obtained the prep from the pharmacy ? N Do you have a care partner and transportation: N Any additional concerns? N  I called the patient 3 times and was unable to get the patient. I left a detailed message about the procedure and the office number in case patient has questions are concerns.

## 2024-12-06 ENCOUNTER — Telehealth: Payer: Self-pay | Admitting: *Deleted

## 2024-12-06 NOTE — Telephone Encounter (Signed)
 Spoke with the patient and canceled her lab/injection appt for 1/2 due to her having surgery on 1/14

## 2024-12-06 NOTE — Telephone Encounter (Signed)
"  Left message for pt to call back   "

## 2024-12-07 ENCOUNTER — Telehealth: Payer: Self-pay | Admitting: Oncology

## 2024-12-07 NOTE — Telephone Encounter (Signed)
 Called [facility] regarding moving the colonoscopy closes to surgery on 12/21/24 per Dr. Viktoria.  The office is closed until 12/09/24 so will call back.

## 2024-12-09 ENCOUNTER — Inpatient Hospital Stay

## 2024-12-09 ENCOUNTER — Telehealth: Payer: Self-pay | Admitting: Pediatrics

## 2024-12-09 NOTE — Telephone Encounter (Signed)
 Call from [facility] nurse navigator stating pt is being scheduled for an upcoming procedure on 12/21/2024.  Nurse asking if upcoming colonoscopy 12/13/2024 can be rescheduled closer to 01/14 procedure. Due to prep. Call Back Number [phone] Darice. Please advise thank you.

## 2024-12-09 NOTE — Telephone Encounter (Signed)
 Called [facility] and they will send a note to Dr. Andy nurse and will call back.

## 2024-12-09 NOTE — Telephone Encounter (Signed)
"  Left message for pt to call back   "

## 2024-12-09 NOTE — Telephone Encounter (Signed)
 Unable to reach Kristin Boyd at number provided. Secure chat message sent. Currently we do not have any sooner hospital dates with Dr. Suzann closer to 1/14.

## 2024-12-12 ENCOUNTER — Other Ambulatory Visit (HOSPITAL_COMMUNITY): Payer: Self-pay | Admitting: *Deleted

## 2024-12-12 ENCOUNTER — Other Ambulatory Visit: Payer: Self-pay | Admitting: Gynecologic Oncology

## 2024-12-12 DIAGNOSIS — N809 Endometriosis, unspecified: Secondary | ICD-10-CM

## 2024-12-13 ENCOUNTER — Encounter (HOSPITAL_COMMUNITY): Payer: Self-pay | Admitting: Pediatrics

## 2024-12-13 ENCOUNTER — Other Ambulatory Visit: Payer: Self-pay

## 2024-12-13 ENCOUNTER — Other Ambulatory Visit (HOSPITAL_COMMUNITY): Payer: Self-pay

## 2024-12-13 ENCOUNTER — Ambulatory Visit (HOSPITAL_COMMUNITY)
Admission: RE | Admit: 2024-12-13 | Discharge: 2024-12-13 | Disposition: A | Discharge status: Home / Self Care | Attending: Pediatrics | Admitting: Pediatrics

## 2024-12-13 ENCOUNTER — Ambulatory Visit (HOSPITAL_COMMUNITY)

## 2024-12-13 ENCOUNTER — Encounter (HOSPITAL_COMMUNITY): Admission: RE | Disposition: A | Payer: Self-pay | Source: Home / Self Care | Attending: Pediatrics

## 2024-12-13 DIAGNOSIS — I10 Essential (primary) hypertension: Secondary | ICD-10-CM | POA: Insufficient documentation

## 2024-12-13 DIAGNOSIS — M1990 Unspecified osteoarthritis, unspecified site: Secondary | ICD-10-CM | POA: Diagnosis not present

## 2024-12-13 DIAGNOSIS — N736 Female pelvic peritoneal adhesions (postinfective): Secondary | ICD-10-CM

## 2024-12-13 DIAGNOSIS — D125 Benign neoplasm of sigmoid colon: Secondary | ICD-10-CM

## 2024-12-13 DIAGNOSIS — N9489 Other specified conditions associated with female genital organs and menstrual cycle: Secondary | ICD-10-CM

## 2024-12-13 DIAGNOSIS — R1084 Generalized abdominal pain: Secondary | ICD-10-CM

## 2024-12-13 DIAGNOSIS — F17210 Nicotine dependence, cigarettes, uncomplicated: Secondary | ICD-10-CM | POA: Diagnosis not present

## 2024-12-13 DIAGNOSIS — K5730 Diverticulosis of large intestine without perforation or abscess without bleeding: Secondary | ICD-10-CM | POA: Diagnosis not present

## 2024-12-13 DIAGNOSIS — Z1211 Encounter for screening for malignant neoplasm of colon: Secondary | ICD-10-CM

## 2024-12-13 DIAGNOSIS — R1030 Lower abdominal pain, unspecified: Secondary | ICD-10-CM

## 2024-12-13 DIAGNOSIS — K648 Other hemorrhoids: Secondary | ICD-10-CM | POA: Insufficient documentation

## 2024-12-13 DIAGNOSIS — D259 Leiomyoma of uterus, unspecified: Secondary | ICD-10-CM

## 2024-12-13 DIAGNOSIS — K635 Polyp of colon: Secondary | ICD-10-CM | POA: Insufficient documentation

## 2024-12-13 DIAGNOSIS — N809 Endometriosis, unspecified: Secondary | ICD-10-CM

## 2024-12-13 HISTORY — PX: POLYPECTOMY: SHX149

## 2024-12-13 HISTORY — PX: COLONOSCOPY: SHX5424

## 2024-12-13 HISTORY — DX: Personal history of urinary calculi: Z87.442

## 2024-12-13 SURGERY — COLONOSCOPY
Anesthesia: Monitor Anesthesia Care

## 2024-12-13 MED ORDER — LIDOCAINE 2% (20 MG/ML) 5 ML SYRINGE
INTRAMUSCULAR | Status: DC | PRN
  Administered 2024-12-13: 40 mg via INTRAVENOUS

## 2024-12-13 MED ORDER — PROPOFOL 500 MG/50ML IV EMUL
INTRAVENOUS | Status: AC
  Filled 2024-12-13: qty 50

## 2024-12-13 MED ORDER — SODIUM CHLORIDE 0.9 % IV SOLN: INTRAVENOUS | Status: DC

## 2024-12-13 MED ORDER — PROPOFOL 500 MG/50ML IV EMUL
INTRAVENOUS | Status: DC | PRN
  Administered 2024-12-13: 40 mg via INTRAVENOUS
  Administered 2024-12-13: 50 mg via INTRAVENOUS
  Administered 2024-12-13: 100 ug/kg/min via INTRAVENOUS
  Administered 2024-12-13: 40 mg via INTRAVENOUS
  Administered 2024-12-13 (×2): 30 mg via INTRAVENOUS

## 2024-12-13 NOTE — Discharge Instructions (Signed)

## 2024-12-13 NOTE — Patient Instructions (Incomplete)
 Preparing for your Surgery  Plan for surgery on 12/21/2024 with Dr. Comer Dollar at [facility]. You will be scheduled for diagnostic laparoscopy, total abdominal hysterectomy, bilateral salpingectomy, possible oophorectomy, possible bowel surgery.  We will have you meet with the ostomy nurse before surgery to be marked on your abdomen for possible ostomy if needed.    Pre-operative Testing -(Done on 12/15/24) You will receive a phone call from presurgical testing at [facility] to arrange for a pre-operative appointment and lab work.  -Bring your insurance card, copy of an advanced directive if applicable, medication list  -At that visit, you will be asked to sign a consent for a possible blood transfusion in case a transfusion becomes necessary during surgery.  The need for a blood transfusion is rare but having consent is a necessary part of your care.     -You should not be taking blood thinners or aspirin at least ten days prior to surgery unless instructed by your surgeon.  -Do not take supplements such as fish oil (omega 3), red yeast rice, turmeric before your surgery. STOP TAKING AT LEAST 10 DAYS BEFORE SURGERY. You want to avoid medications with aspirin in them including headache powders such as BC or Goody's), Excedrin migraine.  -If you are taking a GLP-1 medication/injection such as Ozempic, Mounjaro, [id number], this needs to be held before surgery for at least 7 days before.  Day Before Surgery at Home -SEE BOWEL PREP INSTRUCTIONS. You will be advised you can have clear liquids up until 3 hours before your surgery. Avoid carbonated beverages.    If your bowels are filled with gas, your surgeon will have difficulty visualizing your pelvic organs which increases your surgical risks.  Your role in recovery Your role is to become active as soon as directed by your doctor, while still giving yourself time to heal.  Rest when you feel tired. You will be asked to do  the following in order to speed your recovery:  - Cough and breathe deeply. This helps to clear and expand your lungs and can prevent pneumonia after surgery.  - STAY ACTIVE WHEN YOU GET HOME. Do mild physical activity. Walking or moving your legs help your circulation and body functions return to normal. Do not try to get up or walk alone the first time after surgery.   -If you develop swelling on one leg or the other, pain in the back of your leg, redness/warmth in one of your legs, please call the office or go to the Emergency Room to have a doppler to rule out a blood clot. For shortness of breath, chest pain-seek care in the Emergency Room as soon as possible. - Actively manage your pain. Managing your pain lets you move in comfort. We will ask you to rate your pain on a scale of zero to 10. It is your responsibility to tell your doctor or nurse where and how much you hurt so your pain can be treated.  Special Considerations -If you are diabetic, you may be placed on insulin after surgery to have closer control over your blood sugars to promote healing and recovery.  This does not mean that you will be discharged on insulin.  If applicable, your oral antidiabetics will be resumed when you are tolerating a solid diet.  -Your final pathology results from surgery should be available around one week after surgery and the results will be relayed to you when available.  -FMLA forms can be faxed to [phone]  and please allow 5-7 business days for completion.  Pain Management After Surgery -You will be prescribed your pain medication and bowel regimen medications before surgery so that you can have these available when you are discharged from the hospital. The pain medication is for use ONLY AFTER surgery and a new prescription will not be given.   -Make sure that you have Tylenol  and Ibuprofen  IF YOU ARE ABLE TO TAKE THESE MEDICATIONS at home to use on a regular basis after surgery for pain  control. We recommend alternating the medications every hour to six hours since they work differently and are processed in the body differently for pain relief.  -Review the attached handout on narcotic use and their risks and side effects.   Bowel Regimen -You will be prescribed Sennakot-S to take nightly to prevent constipation especially if you are taking the narcotic pain medication intermittently.  It is important to prevent constipation and drink adequate amounts of liquids. You can stop taking this medication when you are not taking pain medication and you are back on your normal bowel routine.  Risks of Surgery Risks of surgery are low but include bleeding, infection, damage to surrounding structures, re-operation, blood clots, and very rarely death.   Blood Transfusion Information (For the consent to be signed before surgery)  We will be checking your blood type before surgery so in case of emergencies, we will know what type of blood you would need.                                            WHAT IS A BLOOD TRANSFUSION?  A transfusion is the replacement of blood or some of its parts. Blood is made up of multiple cells which provide different functions. Red blood cells carry oxygen  and are used for blood loss replacement. White blood cells fight against infection. Platelets control bleeding. Plasma helps clot blood. Other blood products are available for specialized needs, such as hemophilia or other clotting disorders. BEFORE THE TRANSFUSION  Who gives blood for transfusions?  You may be able to donate blood to be used at a later date on yourself (autologous donation). Relatives can be asked to donate blood. This is generally not any safer than if you have received blood from a stranger. The same precautions are taken to ensure safety when a relative's blood is donated. Healthy volunteers who are fully evaluated to make sure their blood is safe. This is blood bank  blood. Transfusion therapy is the safest it has ever been in the practice of medicine. Before blood is taken from a donor, a complete history is taken to make sure that person has no history of diseases nor engages in risky social behavior (examples are intravenous drug use or sexual activity with multiple partners). The donor's travel history is screened to minimize risk of transmitting infections, such as malaria. The donated blood is tested for signs of infectious diseases, such as HIV and hepatitis. The blood is then tested to be sure it is compatible with you in order to minimize the chance of a transfusion reaction. If you or a relative donates blood, this is often done in anticipation of surgery and is not appropriate for emergency situations. It takes many days to process the donated blood. RISKS AND COMPLICATIONS Although transfusion therapy is very safe and saves many lives, the main dangers of transfusion include:  Getting an infectious disease. Developing a transfusion reaction. This is an allergic reaction to something in the blood you were given. Every precaution is taken to prevent this. The decision to have a blood transfusion has been considered carefully by your caregiver before blood is given. Blood is not given unless the benefits outweigh the risks.  AFTER SURGERY INSTRUCTIONS  Return to work: 4-6 weeks if applicable  You will have a white honeycomb dressing over your larger incision. This dressing can be removed 5 days after surgery and you do not need to reapply a new dressing. Once you remove the dressing, you will notice that you have the surgical glue (dermabond) on the incision and this will peel off on its own. You can get this dressing wet in the shower the days after surgery prior to removal on the 5th day.   Activity: 1. Be up and out of the bed during the day.  Take a nap if needed.  You may walk up steps but be careful and use the hand rail.  Stair climbing will tire  you more than you think, you may need to stop part way and rest.   2. No lifting or straining for 6 weeks over 10 pounds. No pushing, pulling, straining for 6 weeks.  3. No driving for 4-89 days when the following criteria have been met: Do not drive if you are taking narcotic pain medicine and make sure that your reaction time has returned.   4. You can shower as soon as the next day after surgery. Shower daily.  Use your regular soap and water  (not directly on the incision) and pat your incision(s) dry afterwards; don't rub.  No tub baths or submerging your body in water  until cleared by your surgeon. If you have the soap that was given to you by pre-surgical testing that was used before surgery, you do not need to use it afterwards because this can irritate your incisions.   5. No sexual activity and nothing in the vagina for 12 weeks.  6. You may experience a small amount of clear drainage from your incisions, which is normal.  If the drainage persists, increases, or changes color please call the office.  7. Do not use creams, lotions, or ointments such as neosporin on your incisions after surgery until advised by your surgeon because they can cause removal of the dermabond glue on your incisions.    8. You may experience vaginal spotting after surgery or when the stitches at the top of the vagina begin to dissolve.  The spotting is normal but if you experience heavy bleeding, call our office.  9. Take Tylenol  or ibuprofen  first for pain if you are able to take these medications and only use narcotic pain medication for severe pain not relieved by the Tylenol  or Ibuprofen .  Monitor your Tylenol  intake to a max of 4,000 mg in a 24 hour period. You can alternate these medications after surgery.  Diet: 1. Low sodium Heart Healthy Diet is recommended but you are cleared to resume your normal (before surgery) diet after your procedure.  2. It is safe to use a laxative, such as Miralax or Colace,  if you have difficulty moving your bowels before surgery. You have been prescribed Sennakot-S to take at bedtime every evening after surgery to keep bowel movements regular and to prevent constipation.    Wound Care: 1. Keep clean and dry.  Shower daily.  Reasons to call the Doctor: Fever - Oral temperature  greater than 100.4 degrees Fahrenheit Foul-smelling vaginal discharge Difficulty urinating Nausea and vomiting Increased pain at the site of the incision that is unrelieved with pain medicine. Difficulty breathing with or without chest pain New calf pain especially if only on one side Sudden, continuing increased vaginal bleeding with or without clots.   Contacts: For questions or concerns you should contact:  Dr. Comer Dollar at [phone]  [name], NP at [phone]  After Hours: call [phone] and have the GYN Oncologist paged/contacted (after 5 pm or on the weekends). You will speak with an after hours RN and let he or she know you have had surgery.  Messages sent via mychart are for non-urgent matters and are not responded to after hours so for urgent needs, please call the after hours number.  GYN Oncology Bowel Preparation for surgery   There are two important steps to take to prepare for your surgery:   1. Cleansing of your colon: all the stool is washed out of your colon.   2. Take antibiotics: taken by mouth to help prevent infection.   INSTRUCTIONS:   As Soon As Possible (at least 2-3 days BEFORE your surgery)   Please buy the following (5) items from a pharmacy:   The first two items are antibiotics. The first four items will be sent in as prescriptions and you can get the G2 or powerade at the drug store or grocery store.   1. Erythromycin pills - 1 gram, 3 doses (antibiotic)   2. Neomycin pills - 1 gram, 3 doses (antibiotic)   The next 2 items are laxatives and work to cleanse your colon.   3. 4 Dulcolax 5 mg tablets   4. 238 grams of  Miralax (8.3 ounce bottle)   5. 64 oz of Gatorade or Powerade (not red)   (NOTE: If you are allergic to any of these medications, the prep will NOT be prescribed for your. Please let your physician know of any allergies.)   The Day Before Your Surgery   1. Do NOT eat any solid food. Do NOT drink unfiltered juices such as apple cider. Drink only clear liquids such as juice, black coffee, tea, sports drinks, soda pop, Jell-O, water . Please refer to handout from the pre-care suite for more details).   2. Starting at 9:00 a.m.: Take 4 Dulcolax tablets with 2 glasses of clear liquid.   3. 11:00 a.m.: Mix whole bottle of Miralax in 64 oz of Gatorade and drink one 8 oz glass every 15 minutes until gone.   4. When you have finished the Miralax, drink at least 4 glasses of clear liquids of your choice. You will start experiencing diarrhea anywhere between 30 minutes to 3 hours after completing the Miralax. Keep drinking plenty of clear liquids throughout the day. This will keep you from getting dehydrated from the diarrhea.   5. Take your antibiotics by mouth at these times after you complete the Miralax:   - At 2 p.m.: Take Erythromycin (1 gram) and Neomycin (1 gram)   - At 3 p.m.: Take Erythromycin (1 gram) and Neomycin (1 gram)   - At 10 p.m.: Take Erythromycin (1 gram) and Neomycin (1 gram)   The Day of Your Surgery   On the morning of your surgery, only take the medicines that the pre-care suite told you were okay to take. Take them only with a sip of water .   Special Instructions:   - Please be sure you make your surgeon aware if  you have diabetes. Your diabetic medications may need to be adjusted.   - If you feel dizzy or have severe nausea, vomiting or abdominal pain, or if you cannot finish drinking the bowel prep, please call the Gynecologic Oncology clinic at [phone] or your surgeon.   - If you have any life-threatening symptoms including wheezing, chest tightness, fever,  swelling of your face, lips, tongue or throat, call 9-1-1- right away.   Questions?   Please call if you have questions or concerns:   - Weekdays 8:00 a.m. to 5:00 pm: Call the office at [phone]   - After hours and on weekends and holidays, call the paging operator at [phone] and ask for the GYN ONC on call to be paged.

## 2024-12-13 NOTE — Progress Notes (Unsigned)
 Patient here for follow up with Dr. Viktoria and for a pre-operative appointment prior to her scheduled surgery on 12/21/2024. She is scheduled for diagnostic laparoscopy, total abdominal hysterectomy, bilateral salpingectomy, possible oophorectomy, possible bowel surgery.  The surgery was discussed in detail.  See after visit summary for additional details.      Discussed post-op pain management in detail including the aspects of the enhanced recovery pathway.  Advised her that a new prescription would be sent in for  and it is only to be used for after her upcoming surgery.  We discussed the use of tylenol  post-op and to monitor for a maximum of 4,000 mg in a 24 hour period.  Also prescribed sennakot to be used after surgery and to hold if having loose stools.  Discussed bowel regimen in detail.     Discussed measures to take at home to prevent DVT including frequent mobility.  Reportable signs and symptoms of DVT discussed. Post-operative instructions discussed and expectations for after surgery. Incisional care discussed as well including reportable signs and symptoms including erythema, drainage, wound separation.     15 minutes spent preparing information and with the patient.  Verbalizing understanding of material discussed. No needs or concerns voiced at the end of the visit.   Advised patient to call for any needs.  Advised that her post-operative medications had been prescribed and could be picked up at any time.    This appointment is included in the global surgical bundle as pre-operative teaching and has no charge.

## 2024-12-13 NOTE — Patient Instructions (Addendum)
 SURGICAL WAITING ROOM VISITATION Patients having surgery or a procedure may have no more than 2 support people in the waiting area - these visitors may rotate.    Children under the age of 45 will not be allowed to visit due to the increase in respiratory illness  Children under the age of 48 must have an adult with them who is not the patient.  If the patient needs to stay at the hospital during part of their recovery, the visitor guidelines for inpatient rooms apply. Pre-op nurse will coordinate an appropriate time for 1 support person to accompany patient in pre-op.  This support person may not rotate.    Please refer to the [facility] website for the visitor guidelines for Inpatients (after your surgery is over and you are in a regular room).       Your procedure is scheduled on: 12-21-24   Report to [facility] Main Entrance    Report to admitting at 11:15 AM   Call this number if you have problems the morning of surgery [phone]   Do not eat food :After Midnight.   After Midnight you may have the following liquids until 10:30 AM DAY OF SURGERY  Water  Non-Citrus Juices (without pulp, NO RED-Apple, White grape, White cranberry) Black Coffee (NO MILK/CREAM OR CREAMERS, sugar ok)  Clear Tea (NO MILK/CREAM OR CREAMERS, sugar ok) regular and decaf                             Plain Jell-O (NO RED)                                           Fruit ices (not with fruit pulp, NO RED)                                     Popsicles (NO RED)                                                               Sports drinks like Gatorade (NO RED)   Drink 2 Pre-surgery Ensure the evening before surgery                 The day of surgery:  Drink ONE (1) Pre-Surgery Clear Ensure  by 10:30 AM the morning of surgery. Drink in one sitting. Do not sip.  This drink was given to you during your hospital  pre-op appointment visit. Nothing else to drink after completing the Pre-Surgery  Clear Ensure          If you have questions, please contact your surgeons office.   FOLLOW BOWEL PREP AND ANY ADDITIONAL PRE OP INSTRUCTIONS YOU RECEIVED FROM YOUR SURGEON'S OFFICE!!!     Oral Hygiene is also important to reduce your risk of infection.                                    Remember - BRUSH YOUR TEETH THE MORNING OF SURGERY WITH  YOUR REGULAR TOOTHPASTE   Do NOT smoke after Midnight   Take these medicines the morning of surgery with A SIP OF WATER :    Amlodipine    Bupropion   Gabapentin    If needed Tylenol , Hydrocodone   Stop all vitamins and herbal supplements 7 days before surgery                              You may not have any metal on your body including hair pins, jewelry, and body piercing             Do not wear make-up, lotions, powders, perfumes or deodorant  Do not wear nail polish including gel and S&S, artificial/acrylic nails, or any other type of covering on natural nails including finger and toenails. If you have artificial nails, gel coating, etc. that needs to be removed by a nail salon please have this removed prior to surgery or surgery may need to be canceled/ delayed if the surgeon/ anesthesia feels like they are unable to be safely monitored.   Do not shave  48 hours prior to surgery.              Do not bring valuables to the hospital. [facility] IS NOT RESPONSIBLE   FOR VALUABLES.   Contacts, dentures or bridgework may not be worn into surgery.   Bring small overnight bag day of surgery.   DO NOT BRING YOUR HOME MEDICATIONS TO THE HOSPITAL. PHARMACY WILL DISPENSE MEDICATIONS LISTED ON YOUR MEDICATION LIST TO YOU DURING YOUR ADMISSION IN THE HOSPITAL!    Special Instructions: Bring a copy of your healthcare power of attorney and living will documents the day of surgery if you haven't scanned them before.              Please read over the following fact sheets you were given: IF YOU HAVE QUESTIONS ABOUT YOUR PRE-OP INSTRUCTIONS PLEASE CALL  [phone] Kristin Boyd  If you received a COVID test during your pre-op visit  it is requested that you wear a mask when out in public, stay away from anyone that may not be feeling well and notify your surgeon if you develop symptoms. If you test positive for Covid or have been in contact with anyone that has tested positive in the last 10 days please notify you surgeon.  [facility] - Preparing for Surgery Before surgery, you can play an important role.  Because skin is not sterile, your skin needs to be as free of germs as possible.  You can reduce the number of germs on your skin by washing with CHG (chlorahexidine gluconate) soap before surgery.  CHG is an antiseptic cleaner which kills germs and bonds with the skin to continue killing germs even after washing. Please DO NOT use if you have an allergy to CHG or antibacterial soaps.  If your skin becomes reddened/irritated stop using the CHG and inform your nurse when you arrive at Short Stay. Do not shave (including legs and underarms) for at least 48 hours prior to the first CHG shower.  You may shave your face/neck.  Please follow these instructions carefully:  1.  Shower with CHG Soap the night before surgery ONLY (DO NOT USE THE SOAP THE MORNING OF SURGERY).  2.  If you choose to wash your hair, wash your hair first as usual with your normal  shampoo.  3.  After you shampoo, rinse your hair and body thoroughly to  remove the shampoo.                             4.  Use CHG as you would any other liquid soap.  You can apply chg directly to the skin and wash.  Gently with a scrungie or clean washcloth.  5.  Apply the CHG Soap to your body ONLY FROM THE NECK DOWN.   Do   not use on face/ open                           Wound or open sores. Avoid contact with eyes, ears mouth and   genitals (private parts).                       Wash face,  Genitals (private parts) with your normal soap.             6.  Wash thoroughly, paying special attention to  the area where your    surgery  will be performed.  7.  Thoroughly rinse your body with warm water  from the neck down.  8.  DO NOT shower/wash with your normal soap after using and rinsing off the CHG Soap.                9.  Pat yourself dry with a clean towel.            10.  Wear clean pajamas.            11.  Place clean sheets on your bed the night of your first shower and do not  sleep with pets. Day of Surgery : Do not apply any CHG, lotions/deodorants the morning of surgery.  Please wear clean clothes to the hospital/surgery center.  FAILURE TO FOLLOW THESE INSTRUCTIONS MAY RESULT IN THE CANCELLATION OF YOUR SURGERY  PATIENT SIGNATURE_________________________________  NURSE SIGNATURE__________________________________  ________________________________________________________________________ WHAT IS A BLOOD TRANSFUSION? Blood Transfusion Information  A transfusion is the replacement of blood or some of its parts. Blood is made up of multiple cells which provide different functions. Red blood cells carry oxygen  and are used for blood loss replacement. White blood cells fight against infection. Platelets control bleeding. Plasma helps clot blood. Other blood products are available for specialized needs, such as hemophilia or other clotting disorders. BEFORE THE TRANSFUSION  Who gives blood for transfusions?  Healthy volunteers who are fully evaluated to make sure their blood is safe. This is blood bank blood. Transfusion therapy is the safest it has ever been in the practice of medicine. Before blood is taken from a donor, a complete history is taken to make sure that person has no history of diseases nor engages in risky social behavior (examples are intravenous drug use or sexual activity with multiple partners). The donor's travel history is screened to minimize risk of transmitting infections, such as malaria. The donated blood is tested for signs of infectious diseases, such as  HIV and hepatitis. The blood is then tested to be sure it is compatible with you in order to minimize the chance of a transfusion reaction. If you or a relative donates blood, this is often done in anticipation of surgery and is not appropriate for emergency situations. It takes many days to process the donated blood. RISKS AND COMPLICATIONS Although transfusion therapy is very safe and saves many lives, the main dangers of transfusion include:  Getting an infectious disease. Developing a transfusion reaction. This is an allergic reaction to something in the blood you were given. Every precaution is taken to prevent this. The decision to have a blood transfusion has been considered carefully by your caregiver before blood is given. Blood is not given unless the benefits outweigh the risks. AFTER THE TRANSFUSION Right after receiving a blood transfusion, you will usually feel much better and more energetic. This is especially true if your red blood cells have gotten low (anemic). The transfusion raises the level of the red blood cells which carry oxygen , and this usually causes an energy increase. The nurse administering the transfusion will monitor you carefully for complications. HOME CARE INSTRUCTIONS  No special instructions are needed after a transfusion. You may find your energy is better. Speak with your caregiver about any limitations on activity for underlying diseases you may have. SEEK MEDICAL CARE IF:  Your condition is not improving after your transfusion. You develop redness or irritation at the intravenous (IV) site. SEEK IMMEDIATE MEDICAL CARE IF:  Any of the following symptoms occur over the next 12 hours: Shaking chills. You have a temperature by mouth above 102 F (38.9 C), not controlled by medicine. Chest, back, or muscle pain. People around you feel you are not acting correctly or are confused. Shortness of breath or difficulty breathing. Dizziness and fainting. You get a  rash or develop hives. You have a decrease in urine output. Your urine turns a dark color or changes to pink, red, or brown. Any of the following symptoms occur over the next 10 days: You have a temperature by mouth above 102 F (38.9 C), not controlled by medicine. Shortness of breath. Weakness after normal activity. The white part of the eye turns yellow (jaundice). You have a decrease in the amount of urine or are urinating less often. Your urine turns a dark color or changes to pink, red, or brown. Document Released: 11/21/2000 Document Revised: 02/16/2012 Document Reviewed: 07/10/2008 [facility] Patient Information 2014 [place], [place].  _______________________________________________________________________

## 2024-12-13 NOTE — Anesthesia Preprocedure Evaluation (Signed)
"                                    Anesthesia Evaluation  Patient identified by MRN, date of birth, ID band Patient awake    Reviewed: Allergy & Precautions, H&P , NPO status , Patient's Chart, lab work & pertinent test results  History of Anesthesia Complications Negative for: history of anesthetic complications  Airway Mallampati: II  TM Distance: >3 FB Neck ROM: Full    Dental no notable dental hx.    Pulmonary neg pulmonary ROS, neg pneumonia , Current Smoker   Pulmonary exam normal breath sounds clear to auscultation       Cardiovascular hypertension, Normal cardiovascular exam Rhythm:Regular Rate:Normal     Neuro/Psych neg Seizures negative neurological ROS  negative psych ROS   GI/Hepatic negative GI ROS, Neg liver ROS,,,  Endo/Other  negative endocrine ROS    Renal/GU negative Renal ROS  negative genitourinary   Musculoskeletal  (+) Arthritis ,    Abdominal   Peds negative pediatric ROS (+)  Hematology negative hematology ROS (+)   Anesthesia Other Findings   Reproductive/Obstetrics negative OB ROS                              Anesthesia Physical Anesthesia Plan  ASA: 2  Anesthesia Plan: MAC   Post-op Pain Management:    Induction: Intravenous  PONV Risk Score and Plan: 1 and Propofol  infusion and Treatment may vary due to age or medical condition  Airway Management Planned: Natural Airway  Additional Equipment:   Intra-op Plan:   Post-operative Plan:   Informed Consent: I have reviewed the patients History and Physical, chart, labs and discussed the procedure including the risks, benefits and alternatives for the proposed anesthesia with the patient or authorized representative who has indicated his/her understanding and acceptance.     Dental advisory given  Plan Discussed with: CRNA  Anesthesia Plan Comments:         Anesthesia Quick Evaluation  "

## 2024-12-13 NOTE — Transfer of Care (Signed)
 Immediate Anesthesia Transfer of Care Note  Patient: Kristin Boyd  Procedure(s) Performed: COLONOSCOPY POLYPECTOMY, INTESTINE  Patient Location: PACU  Anesthesia Type:MAC  Level of Consciousness: awake  Airway & Oxygen  Therapy: Patient Spontanous Breathing  Post-op Assessment: Report given to RN and Post -op Vital signs reviewed and stable  Post vital signs: Reviewed and stable  Last Vitals:  Vitals Value Taken Time  BP 133/71 12/13/24 09:11  Temp 36.6 C 12/13/24 09:10  Pulse 78 12/13/24 09:16  Resp 26 12/13/24 09:16  SpO2 98 % 12/13/24 09:16  Vitals shown include unfiled device data.  Last Pain:  Vitals:   12/13/24 0910  TempSrc: Temporal  PainSc: 0-No pain         Complications: No notable events documented.

## 2024-12-13 NOTE — Op Note (Signed)
 [facility] Patient Name: Kristin Boyd Procedure Date: 12/13/2024 MRN: [id number] Attending MD: Inocente Hausen , MD, [id number] Date of Birth: [birth year] CSN: [id number] Age: 44 Admit Type: Outpatient Procedure:                Colonoscopy Indications:              This is the patient's first colonoscopy,                            Generalized abdominal pain, Colonoscopy requested                            prior to surgery for endometriosis Providers:                Inocente Hausen, MD, Hoy Penner, RN, Felice Sar,                            Technician Referring MD:              Medicines:                Monitored Anesthesia Care Complications:            No immediate complications. Estimated blood loss:                            Minimal. Estimated Blood Loss:     Estimated blood loss was minimal. Procedure:                Pre-Anesthesia Assessment:                           - Prior to the procedure, a History and Physical                            was performed, and patient medications and                            allergies were reviewed. The patient's tolerance of                            previous anesthesia was also reviewed. The risks                            and benefits of the procedure and the sedation                            options and risks were discussed with the patient.                            All questions were answered, and informed consent                            was obtained. Prior Anticoagulants: The patient has                            taken no anticoagulant or antiplatelet  agents. ASA                            Grade Assessment: III - A patient with severe                            systemic disease. After reviewing the risks and                            benefits, the patient was deemed in satisfactory                            condition to undergo the procedure.                           After obtaining informed consent,  the colonoscope                            was passed under direct vision. Throughout the                            procedure, the patient's blood pressure, pulse, and                            oxygen  saturations were monitored continuously. The                            [id number]-[id number] ([id number]) colonoscope was introduced                            through the anus and advanced to the terminal                            ileum. The colonoscopy was performed without                            difficulty. The patient tolerated the procedure                            well. The quality of the bowel preparation was                            good. The terminal ileum, ileocecal valve,                            appendiceal orifice, and rectum were photographed. Scope In: 8:39:03 AM Scope Out: 9:03:44 AM Scope Withdrawal Time: 0 hours 17 minutes 42 seconds  Total Procedure Duration: 0 hours 24 minutes 41 seconds  Findings:      The perianal and digital rectal examinations were normal. Pertinent       negatives include normal sphincter tone and no palpable rectal lesions.      A few small-mouthed diverticula were found in the sigmoid colon.      A 5 mm polyp was found in the sigmoid colon. The polyp was sessile. The  polyp was removed with a cold snare. Resection and retrieval were       complete.      The terminal ileum appeared normal.      Internal hemorrhoids were found during retroflexion. Impression:               - Diverticulosis in the sigmoid colon.                           - One 5 mm polyp in the sigmoid colon, removed with                            a cold snare. Resected and retrieved.                           - The examined portion of the ileum was normal.                           - Internal hemorrhoids. Moderate Sedation:      Not Applicable - Patient had care per Anesthesia. Recommendation:           - Discharge patient to home (ambulatory).                           -  Await pathology results.                           - Repeat colonoscopy for surveillance based on                            pathology results.                           - The findings and recommendations were discussed                            with the patient.                           - Patient has a contact number available for                            emergencies. The signs and symptoms of potential                            delayed complications were discussed with the                            patient. Return to normal activities tomorrow.                            Written discharge instructions were provided to the                            patient. Procedure Code(s):        --- Professional ---  54614, Colonoscopy, flexible; with removal of                            tumor(s), polyp(s), or other lesion(s) by snare                            technique Diagnosis Code(s):        --- Professional ---                           K64.8, Other hemorrhoids                           D12.5, Benign neoplasm of sigmoid colon                           R10.84, Generalized abdominal pain                           K57.30, Diverticulosis of large intestine without                            perforation or abscess without bleeding CPT copyright 2022 [employer]. All rights reserved. The codes documented in this report are preliminary and upon coder review may  be revised to meet current compliance requirements. Inocente Hausen, MD 12/13/2024 9:09:02 AM This report has been signed electronically. Number of Addenda: 0

## 2024-12-13 NOTE — Anesthesia Postprocedure Evaluation (Signed)
"   Anesthesia Post Note  Patient: Kristin Boyd  Procedure(s) Performed: COLONOSCOPY POLYPECTOMY, INTESTINE     Patient location during evaluation: PACU Anesthesia Type: MAC Level of consciousness: awake and alert Pain management: pain level controlled Vital Signs Assessment: post-procedure vital signs reviewed and stable Respiratory status: spontaneous breathing, nonlabored ventilation, respiratory function stable and patient connected to nasal cannula oxygen  Cardiovascular status: stable and blood pressure returned to baseline Postop Assessment: no apparent nausea or vomiting Anesthetic complications: no   No notable events documented.  Last Vitals:  Vitals:   12/13/24 0910 12/13/24 0920  BP: 133/71 138/74  Pulse: 80 77  Resp: (!) 29 (!) 21  Temp: 36.6 C   SpO2: 96% 97%    Last Pain:  Vitals:   12/13/24 0920  TempSrc:   PainSc: 0-No pain                 Thom JONELLE Peoples      "

## 2024-12-13 NOTE — H&P (Signed)
 [facility] History and Physical   Primary Care Physician:  Kline, Chianne, PA-C   Reason for Procedure:  Abdominal pain, history of endometriosis  Plan:    Colonoscopy   The patient was provided an opportunity to ask questions and all were answered. The patient agreed with the plan.   HPI: Kristin Boyd is a 44 y.o. female undergoing colonoscopy for investigation of abdominal pain and history of endometriosis.  Patient has a history of stage IV endometriosis with infiltrative disease on previous laparotomy 2022.  She is planned for resection surgery related to endometriosis in January 2026 and colonoscopy as requested prior to surgery.  No prior history of colonoscopy.  No documented family history of colorectal cancer or polyps.  BMI 50.5.   Past Medical History:  Diagnosis Date   Arthritis    Chronic knee pain    Chronic pelvic pain in female    Endometriosis    History of kidney stones    Hypertension    Obese    Ovarian cyst    Pneumonia due to COVID-19 virus 05/26/2019   Postoperative state 11/12/2021   Vaginal Pap smear, abnormal     Past Surgical History:  Procedure Laterality Date   CESAREAN SECTION     CHOLECYSTECTOMY     DILATION AND CURETTAGE OF UTERUS N/A 11/12/2021   Procedure: ATTEMPTED HYSTEROSCOPY;  Surgeon: Viktoria Comer SAUNDERS, MD;  Location: [facility];  Service: Gynecology;  Laterality: N/A;   ENDOMETRIAL ABLATION     WISDOM TOOTH EXTRACTION     2 removed   XI ROBOTIC ASSISTED SALPINGECTOMY N/A 11/12/2021   Procedure: DIAGNOSTIC LAPAROSCOPY, PERITONEAL BIOPSIES;  Surgeon: Viktoria Comer SAUNDERS, MD;  Location: [facility];  Service: Gynecology;  Laterality: N/A;    Prior to Admission medications  Medication Sig Start Date End Date Taking? Authorizing Provider  acetaminophen  (TYLENOL ) 500 MG tablet Take 1,000 mg by mouth every 6 (six) hours as needed for mild pain (pain score 1-3) or moderate pain (pain score 4-6).   Yes [provider]   amLODipine  (NORVASC ) 10 MG tablet TAKE 1 TABLET(10 MG) BY MOUTH DAILY 10/12/23  Yes Viktoria Comer SAUNDERS, MD  buPROPion (WELLBUTRIN XL) 150 MG 24 hr tablet Take 150 mg by mouth daily.   Yes [provider]  gabapentin  (NEURONTIN ) 300 MG capsule Take 300 mg by mouth See admin instructions. 2-3 times daily as needed   Yes [provider]  HYDROcodone -acetaminophen  (NORCO) 7.5-325 MG tablet Take 1 tablet by mouth daily as needed for moderate pain (pain score 4-6) or severe pain (pain score 7-10). 11/08/24  Yes [provider]  UNABLE TO FIND Gallilrey 5mg  daily in am   Yes [provider]  lidocaine  (LIDODERM ) 5 % Place 1 patch onto the skin daily. Remove & Discard patch within 12 hours or as directed by MD Patient not taking: Reported on 12/12/2024 10/31/24   Jarold Olam HERO, PA-C  methocarbamol  (ROBAXIN ) 500 MG tablet Take 1 tablet (500 mg total) by mouth 2 (two) times daily as needed for muscle spasms. Patient taking differently: Take 500 mg by mouth daily as needed for muscle spasms. 04/30/24   Neysa Caron PARAS, DO  norethindrone  (AYGESTIN ) 5 MG tablet Take 1 tablet (5 mg total) by mouth daily. Patient not taking: Reported on 12/12/2024 08/25/24   Viktoria Comer SAUNDERS, MD    Current Facility-Administered Medications  Medication Dose Route Frequency Provider Last Rate Last Admin   0.9 %  sodium chloride  infusion   Intravenous Continuous  Craig Palma R, PA-C        Allergies as of 11/02/2024 - Review Complete 11/02/2024  Allergen Reaction Noted   Peanut (diagnostic) Anaphylaxis 03/02/2016   Penicillins Anaphylaxis 06/15/2014    Family History  Problem Relation Age of Onset   Hypertension Father    Diabetes Father    Colon cancer Neg Hx    Breast cancer Neg Hx    Ovarian cancer Neg Hx    Endometrial cancer Neg Hx    Pancreatic cancer Neg Hx    Prostate cancer Neg Hx     Social History   Socioeconomic History   Marital status: Single    Spouse name:  Not on file   Number of children: 2   Years of education: Not on file   Highest education level: Not on file  Occupational History   Not on file  Tobacco Use   Smoking status: Every Day    Types: Cigarettes   Smokeless tobacco: Never  Vaping Use   Vaping status: Never Used  Substance and Sexual Activity   Alcohol use: Yes    Comment: occasional   Drug use: Never   Sexual activity: Not Currently    Birth control/protection: Surgical  Other Topics Concern   Not on file  Social History Narrative   Not on file   Social Drivers of Health   Tobacco Use: High Risk (11/29/2024)   Patient History    Smoking Tobacco Use: Every Day    Smokeless Tobacco Use: Never    Passive Exposure: Not on file  Financial Resource Strain: Low Risk (07/06/2024)   Received from [facility]   Overall Financial Resource Strain (CARDIA)    How hard is it for you to pay for the very basics like food, housing, medical care, and heating?: Not very hard  Food Insecurity: No Food Insecurity (07/06/2024)   Received from [facility]   Epic    Within the past 12 months, you worried that your food would run out before you got the money to buy more.: Never true    Within the past 12 months, the food you bought just didn't last and you didn't have money to get more.: Never true  Transportation Needs: No Transportation Needs (07/06/2024)   Received from [facility]   Epic    In the past 12 months, has lack of transportation kept you from medical appointments or from getting medications?: No    In the past 12 months, has lack of transportation kept you from meetings, work, or from getting things needed for daily living?: No  Physical Activity: Insufficiently Active (07/06/2024)   Received from [facility]   Exercise Vital Sign    On average, how many days per week do you engage in moderate to strenuous exercise (like a brisk walk)?: 2 days    On average, how many minutes do you engage in exercise at this  level?: 10 min  Stress: No Stress Concern Present (07/06/2024)   Received from [facility] - Occupational Stress Questionnaire    Do you feel stress - tense, restless, nervous, or anxious, or unable to sleep at night because your mind is troubled all the time - these days?: Not at all  Social Connections: Socially Integrated (07/06/2024)   Received from [facility]   Social Network    How would you rate your social network (family, work, friends)?: Good participation with social networks  Intimate Partner Violence: Patient  Declined (07/06/2024)   Received from [facility]   HITS    Over the last 12 months how often did your partner physically hurt you?: Patient declined    Over the last 12 months how often did your partner insult you or talk down to you?: Patient declined    Over the last 12 months how often did your partner threaten you with physical harm?: Patient declined    Over the last 12 months how often did your partner scream or curse at you?: Patient declined  Depression (PHQ2-9): Low Risk (02/04/2023)   Depression (PHQ2-9)    PHQ-2 Score: 0  Alcohol Screen: Not on file  Housing: Low Risk (07/06/2024)   Received from [facility]    In the last 12 months, was there a time when you were not able to pay the mortgage or rent on time?: No    In the past 12 months, how many times have you moved where you were living?: 0    At any time in the past 12 months, were you homeless or living in a shelter (including now)?: No  Utilities: Not At Risk (07/06/2024)   Received from [facility]    In the past 12 months has the electric, gas, oil, or water  company threatened to shut off services in your home?: No  Health Literacy: Not on file    Review of Systems:  All other review of systems negative except as mentioned in the HPI.  Physical Exam: Vital signs BP (!) 151/95   Pulse 82   Temp 98.4 F (36.9 C) (Temporal)    Resp (!) 26   Ht 5' 9 (1.753 m)   Wt (!) 155.1 kg   SpO2 95%   BMI 50.50 kg/m   General:   Alert,  Well-developed, well-nourished, pleasant and cooperative in NAD Lungs:  Clear throughout to auscultation.   Heart:  Regular rate and rhythm; no murmurs, clicks, rubs,  or gallops. Abdomen:  Soft, nontender and nondistended. Normal bowel sounds.   Neuro/Psych:  Normal mood and affect. A and O x 3  Inocente Hausen, MD [facility]

## 2024-12-13 NOTE — Telephone Encounter (Signed)
 Chart reviewed and noted that pt has arrived for her procedure in the hospital.

## 2024-12-14 LAB — SURGICAL PATHOLOGY

## 2024-12-15 ENCOUNTER — Other Ambulatory Visit: Payer: Self-pay | Admitting: Gynecologic Oncology

## 2024-12-15 ENCOUNTER — Encounter (HOSPITAL_COMMUNITY)

## 2024-12-15 ENCOUNTER — Encounter: Payer: Self-pay | Admitting: Gynecologic Oncology

## 2024-12-15 ENCOUNTER — Other Ambulatory Visit: Payer: Self-pay

## 2024-12-15 ENCOUNTER — Inpatient Hospital Stay: Admitting: Gynecologic Oncology

## 2024-12-15 ENCOUNTER — Ambulatory Visit: Payer: Self-pay | Admitting: Pediatrics

## 2024-12-15 ENCOUNTER — Emergency Department (HOSPITAL_COMMUNITY)
Admission: EM | Admit: 2024-12-15 | Discharge: 2024-12-15 | Disposition: A | Discharge status: Home / Self Care | Source: Ambulatory Visit | Attending: Emergency Medicine | Admitting: Emergency Medicine

## 2024-12-15 ENCOUNTER — Encounter (HOSPITAL_COMMUNITY): Payer: Self-pay

## 2024-12-15 ENCOUNTER — Inpatient Hospital Stay

## 2024-12-15 ENCOUNTER — Inpatient Hospital Stay: Attending: Gynecologic Oncology | Admitting: Gynecologic Oncology

## 2024-12-15 ENCOUNTER — Inpatient Hospital Stay: Attending: Gynecologic Oncology

## 2024-12-15 VITALS — BP 154/95 | HR 98 | Temp 98.6°F | Resp 20 | Ht 69.0 in | Wt 348.0 lb

## 2024-12-15 DIAGNOSIS — N809 Endometriosis, unspecified: Secondary | ICD-10-CM

## 2024-12-15 DIAGNOSIS — Z5321 Procedure and treatment not carried out due to patient leaving prior to being seen by health care provider: Secondary | ICD-10-CM | POA: Insufficient documentation

## 2024-12-15 DIAGNOSIS — N9489 Other specified conditions associated with female genital organs and menstrual cycle: Secondary | ICD-10-CM

## 2024-12-15 DIAGNOSIS — R109 Unspecified abdominal pain: Secondary | ICD-10-CM | POA: Diagnosis present

## 2024-12-15 DIAGNOSIS — R102 Pelvic and perineal pain unspecified side: Secondary | ICD-10-CM | POA: Diagnosis not present

## 2024-12-15 DIAGNOSIS — Z79818 Long term (current) use of other agents affecting estrogen receptors and estrogen levels: Secondary | ICD-10-CM | POA: Insufficient documentation

## 2024-12-15 DIAGNOSIS — R9389 Abnormal findings on diagnostic imaging of other specified body structures: Secondary | ICD-10-CM

## 2024-12-15 DIAGNOSIS — G8929 Other chronic pain: Secondary | ICD-10-CM

## 2024-12-15 LAB — URINALYSIS, ROUTINE W REFLEX MICROSCOPIC
Bilirubin Urine: NEGATIVE
Glucose, UA: NEGATIVE mg/dL
Hgb urine dipstick: NEGATIVE
Ketones, ur: NEGATIVE mg/dL
Leukocytes,Ua: NEGATIVE
Nitrite: NEGATIVE
Protein, ur: NEGATIVE mg/dL
Specific Gravity, Urine: 1.019 (ref 1.005–1.030)
pH: 6 (ref 5.0–8.0)

## 2024-12-15 LAB — COMPREHENSIVE METABOLIC PANEL WITH GFR
ALT: 17 U/L (ref 0–44)
AST: 19 U/L (ref 15–41)
Albumin: 4.6 g/dL (ref 3.5–5.0)
Alkaline Phosphatase: 84 U/L (ref 38–126)
Anion gap: 11 (ref 5–15)
BUN: 10 mg/dL (ref 6–20)
CO2: 23 mmol/L (ref 22–32)
Calcium: 9.4 mg/dL (ref 8.9–10.3)
Chloride: 106 mmol/L (ref 98–111)
Creatinine, Ser: 0.75 mg/dL (ref 0.44–1.00)
GFR, Estimated: >60 mL/min
Glucose, Bld: 99 mg/dL (ref 70–99)
Potassium: 4 mmol/L (ref 3.5–5.1)
Sodium: 140 mmol/L (ref 135–145)
Total Bilirubin: 0.5 mg/dL (ref 0.0–1.2)
Total Protein: 8.6 g/dL — ABNORMAL HIGH (ref 6.5–8.1)

## 2024-12-15 LAB — CBC
HCT: 45.4 % (ref 36.0–46.0)
Hemoglobin: 14.6 g/dL (ref 12.0–15.0)
MCH: 26.7 pg (ref 26.0–34.0)
MCHC: 32.2 g/dL (ref 30.0–36.0)
MCV: 83 fL (ref 80.0–100.0)
Platelets: 316 K/uL (ref 150–400)
RBC: 5.47 MIL/uL — ABNORMAL HIGH (ref 3.87–5.11)
RDW: 15 % (ref 11.5–15.5)
WBC: 10.3 K/uL (ref 4.0–10.5)
nRBC: 0 % (ref 0.0–0.2)

## 2024-12-15 LAB — LIPASE, BLOOD: Lipase: 17 U/L (ref 11–51)

## 2024-12-15 LAB — PREGNANCY, URINE: Preg Test, Ur: NEGATIVE

## 2024-12-15 LAB — HCG, SERUM, QUALITATIVE: Preg, Serum: NEGATIVE

## 2024-12-15 MED ORDER — OXYCODONE HCL 5 MG PO TABS: 5.0000 mg | ORAL_TABLET | Freq: Four times a day (QID) | ORAL | 0 refills | Status: DC | PRN

## 2024-12-15 MED ORDER — LEUPROLIDE ACETATE (3 MONTH) 11.25 MG IM KIT
11.2500 mg | PACK | Freq: Once | INTRAMUSCULAR | Status: AC
  Administered 2024-12-15: 11.25 mg via INTRAMUSCULAR
  Filled 2024-12-15: qty 11.25

## 2024-12-15 NOTE — Progress Notes (Signed)
 Oxycodone  sent in per Dr. Viktoria for patient to use for pain related to advanced endometriosis up until the surgery date of Dec 21, 2024

## 2024-12-15 NOTE — ED Triage Notes (Addendum)
 Pt came in for abdominal pain that's been on and off but has came back this morning. Pt stated she's been taking tylenol  with no relief. Pt is also having surgery on the 14th for abdominal issues and has had a colonoscopy recently. Pt denies N/V/D.

## 2024-12-15 NOTE — Patient Instructions (Signed)
 We will plan for a urine pregnancy test today prior to receiving lupron  injection.   We have rescheduled your preop appointments with Dr. Viktoria and our nursing team.

## 2024-12-15 NOTE — Progress Notes (Signed)
 Gynecologic Oncology Return Clinic Visit  12/15/2024  Reason for Visit: follow-up   Treatment History: Patient's history is notable for an endometrial ablation at the age of 57.  She describes her periods as being quite heavy at this time and occurring sometimes multiple times a month.  She then had intermittent pain after the ablation but then in 2019 started to have progressive pain.  She ultimately underwent imaging with CT scan and pelvic ultrasound in August 2020 which showed an 8.3 x 6.6 x 8.9 cm complex cystic and solid left adnexal mass.  Follow-up ultrasound showed a complex and cystic lesion in the left adnexa with a tubular shape measuring 12.6 x 5.6 x 7.4 cm; mass on imaging was felt to perhaps represent a tubo-ovarian abscess.  CA125 was obtained at that time and was mildly elevated at 51.  The plan at that time was for referral to GYN oncology, which did not appear to have happened.     She then had multiple visits in the emergency department with persistence of the mass noted.  Most recently, she had a consultation with Dr. Court at [facility] with a plan to proceed with surgical excision.  Most recent imaging by ultrasound in mid October showed a left adnexal mass measuring 10.3 x 11.4 x 11.2 cm with associated septation and vascularity although poorly visualized.  Patient was seen in our emergency department on the fifth of this month.  She underwent CT and pelvic ultrasound which show persistent mass without significant change in size since most recent ultrasound.     11/12/2021: Diagnostic surgery for a complex adnexal mass and thickened endometrium.  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   She was on Orilissa  for some time with good relief but unfortunately this stopped being covered by her insurance.  She then tried norethindrone  (didn't provide  much relief).    See in the ED on 2/13 with pain.  Pelvic ultrasound at that time showed complex hypoechoic structure in the left ovary measuring 7.7 x 6.9 x 8.6, possible hemorrhagic cyst or endometrioma.  Uterine fibroid also noted.  Mildly thickened endometrium.   Started Lupron  02/26/23. Had significant improvement in her abdominal pain and repeat ultrasound in August 2024 showed decrease size of left ovarian cystic lesion from 8.6 to 6.6 cm.  Seen by OB/GYN around this time and was started on estrogen for add back therapy.   She did not have any ED visit from 01/2023 to 01/2024 for either abdominal or back pain. Last Lupron  was on 06/19/23.  Patient started therapy for back pain in September 2024, did not follow-up for further sessions.   Patient lost insurance.   Since February 2025, she has had 10 emergency department visits for back pain and 8 for abdominal pain.  She has had an additional 3 visits for vaginal bleeding.   Reestablished care with me in mid September.  Given change in insurance, we discussed restarting Lupron  as she had had good relief with this medication.  We discussed doing this through her OB/GYN's office versus our clinic and adding Aygestin  for add back therapy.  We also discussed her vaginal bleeding, suggestive of anovulatory bleeding.  Given known cervical stenosis and inability to sample her endometrium several years ago, discussed either attempted endometrial biopsy versus getting an MRI to better evaluate the lining of her uterus.   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  No discrete uterine myometrial lesion seen, no enhancing component within the endometrium.  Right ovary appears normal.  There is a 9.3 x 11.4 cm hyperintense lesion within the left adnexa exhibiting T2 bleeding without normal left parenchyma noted.   Lesion does not exhibit enhancement on postcontrast images, no mural nodularity.  Along the anterior aspect of the lesion is a nonenhancing dilated fallopian tube.   08/2024: NILM pap, HR HPV neg   09/14/2024: Lupron  injection  She had visits to the emergency department on 10/14, 11/3, and 11/5.  No further visits since then.  Interval History: Patient comes in today in significant pain across her lower abdomen, that is in similar location to when she has had pain related to her endometriosis/adnexal mass.  Been trouble getting comfortable.  Reports that she woke up with 10 out of 10 pain.  Took 2 extra strength Tylenol  with minimal relief.  Had a bowel movement this morning.  Denies any nausea or emesis, ate dinner last night.  Denies fevers/chills.    Past Medical/Surgical History: Past Medical History:  Diagnosis Date   Arthritis    Chronic knee pain    Chronic pelvic pain in female    Endometriosis    History of kidney stones    Hypertension    Obese    Ovarian cyst    Pneumonia due to COVID-19 virus 05/26/2019   Postoperative state 11/12/2021   Vaginal Pap smear, abnormal     Past Surgical History:  Procedure Laterality Date   CESAREAN SECTION     CHOLECYSTECTOMY     COLONOSCOPY N/A 12/13/2024   Procedure: COLONOSCOPY;  Surgeon: Suzann Inocente HERO, MD;  Location: [facility];  Service: Gastroenterology;  Laterality: N/A;   DILATION AND CURETTAGE OF UTERUS N/A 11/12/2021   Procedure: ATTEMPTED HYSTEROSCOPY;  Surgeon: Viktoria Comer SAUNDERS, MD;  Location: [facility];  Service: Gynecology;  Laterality: N/A;   ENDOMETRIAL ABLATION     POLYPECTOMY  12/13/2024   Procedure: POLYPECTOMY, INTESTINE;  Surgeon: Suzann Inocente HERO, MD;  Location: [facility];  Service: Gastroenterology;;   WISDOM TOOTH EXTRACTION     2 removed   XI ROBOTIC ASSISTED SALPINGECTOMY N/A 11/12/2021   Procedure: DIAGNOSTIC LAPAROSCOPY, PERITONEAL BIOPSIES;  Surgeon: Viktoria Comer SAUNDERS, MD;  Location: [facility];  Service:  Gynecology;  Laterality: N/A;    Family History  Problem Relation Age of Onset   Hypertension Father    Diabetes Father    Colon cancer Neg Hx    Breast cancer Neg Hx    Ovarian cancer Neg Hx    Endometrial cancer Neg Hx    Pancreatic cancer Neg Hx    Prostate cancer Neg Hx     Social History   Socioeconomic History   Marital status: Single    Spouse name: Not on file   Number of children: 2   Years of education: Not on file   Highest education level: Not on file  Occupational History   Not on file  Tobacco Use   Smoking status: Every Day    Types: Cigarettes   Smokeless tobacco: Never  Vaping Use   Vaping status: Never Used  Substance and Sexual Activity   Alcohol use: Yes    Comment: occasional   Drug use: Never   Sexual activity: Not Currently    Birth control/protection: Surgical  Other Topics Concern   Not  on file  Social History Narrative   Not on file   Social Drivers of Health   Tobacco Use: High Risk (12/15/2024)   Patient History    Smoking Tobacco Use: Every Day    Smokeless Tobacco Use: Never    Passive Exposure: Not on file  Financial Resource Strain: Low Risk (07/06/2024)   Received from [facility]   Overall Financial Resource Strain (CARDIA)    How hard is it for you to pay for the very basics like food, housing, medical care, and heating?: Not very hard  Food Insecurity: No Food Insecurity (07/06/2024)   Received from [facility]   Epic    Within the past 12 months, you worried that your food would run out before you got the money to buy more.: Never true    Within the past 12 months, the food you bought just didn't last and you didn't have money to get more.: Never true  Transportation Needs: No Transportation Needs (07/06/2024)   Received from [facility]   Epic    In the past 12 months, has lack of transportation kept you from medical appointments or from getting medications?: No    In the past 12 months, has lack of transportation  kept you from meetings, work, or from getting things needed for daily living?: No  Physical Activity: Insufficiently Active (07/06/2024)   Received from [facility]   Exercise Vital Sign    On average, how many days per week do you engage in moderate to strenuous exercise (like a brisk walk)?: 2 days    On average, how many minutes do you engage in exercise at this level?: 10 min  Stress: No Stress Concern Present (07/06/2024)   Received from [facility] of Occupational Health - Occupational Stress Questionnaire    Do you feel stress - tense, restless, nervous, or anxious, or unable to sleep at night because your mind is troubled all the time - these days?: Not at all  Social Connections: Socially Integrated (07/06/2024)   Received from [facility]   Social Network    How would you rate your social network (family, work, friends)?: Good participation with social networks  Depression (PHQ2-9): Low Risk (02/04/2023)   Depression (PHQ2-9)    PHQ-2 Score: 0  Alcohol Screen: Not on file  Housing: Low Risk (07/06/2024)   Received from [facility]    In the last 12 months, was there a time when you were not able to pay the mortgage or rent on time?: No    In the past 12 months, how many times have you moved where you were living?: 0    At any time in the past 12 months, were you homeless or living in a shelter (including now)?: No  Utilities: Not At Risk (07/06/2024)   Received from [facility]    In the past 12 months has the electric, gas, oil, or water  company threatened to shut off services in your home?: No  Health Literacy: Not on file    Current Medications: Current Medications[1]  Review of Systems: + Abdominal pain Denies appetite changes, fevers, chills, fatigue, unexplained weight changes. Denies hearing loss, neck lumps or masses, mouth sores, ringing in ears or voice changes. Denies cough or wheezing.  Denies shortness of  breath. Denies chest pain or palpitations. Denies leg swelling. Denies abdominal distention, blood in stools, constipation, diarrhea, nausea, vomiting, or early satiety. Denies pain with intercourse,  dysuria, frequency, hematuria or incontinence. Denies hot flashes, vaginal bleeding or vaginal discharge.   Denies joint pain or muscle pain/cramps. Denies itching, rash, or wounds. Denies dizziness, headaches, numbness or seizures. Denies swollen lymph nodes or glands, denies easy bruising or bleeding. Denies anxiety, depression, confusion, or decreased concentration.  Physical Exam: BP (!) 154/95 (BP Location: Left Arm, Patient Position: Sitting)   Pulse 98   Temp 98.6 F (37 C) (Oral)   Resp 20   Ht 5' 9 (1.753 m)   Wt (!) 348 lb (157.9 kg)   SpO2 100%   BMI 51.39 kg/m  General: Alert, oriented.  Visibly uncomfortable, moaning frequently and moving around in the chair.  Tearful at times. HEENT: Posterior oropharynx clear, sclera anicteric. Chest: Clear to auscultation bilaterally.  Lungs clear to auscultation bilaterally. Cardiovascular: Rate in the low 90s, regular rhythm, no murmurs. Abdomen: Obese, soft, mild tenderness to palpation.  No rebound or guarding.  Normoactive bowel sounds.  No masses or hepatosplenomegaly appreciated. Extremities: Grossly normal range of motion.  Warm, well perfused.  No edema bilaterally.  Laboratory & Radiologic Studies: Colonoscopy 12/14/23: Diverticulosis in the sigmoid colon.  5 mm polyp in the sigmoid colon removed.  Normal terminal ileum.  Internal hemorrhoids. Sigmoid polypectomy - Hyperplastic polyp.  - No dysplasia or malignancy.   Assessment & Plan: Kristin Boyd is a 44 y.o. woman with Stage IV endometriosis who presents for treatment planning.  Patient is in acute pain this morning, which I suspect is related to her endometriosis but given recent colonoscopy, recommended presenting to the emergency department for expedited workup  including lab work and CT imaging.  Low suspicion for colonic rupture given no findings of an acute abdomen.  Also offered patient option of getting labs and trying to get CT imaging later today outpatient.  Her preference is to proceed to the emergency department.  We also discussed the possibility that she is having some breakthrough symptoms as she was due for her Lupron  injection last week in the setting of her upcoming surgery.  Unfortunately, her appointment for her Lupron  was canceled.  We discussed trying to get her the injection today to help prevent any additional breakthrough symptoms if this is what is contributing to her pain.  Given her distress, we discussed postponing her preoperative visit until next week.  We will plan to do this by phone on Monday.  My office will call the hospital to see if her preanesthesia appointment can also be moved.   22 minutes of total time was spent for this patient encounter, including preparation, face-to-face counseling with the patient and coordination of care, and documentation of the encounter.  Comer Dollar, MD  Division of Gynecologic Oncology  Department of Obstetrics and Gynecology  [facility]      [1]  Current Outpatient Medications:    amLODipine  (NORVASC ) 10 MG tablet, TAKE 1 TABLET(10 MG) BY MOUTH DAILY, Disp: 30 tablet, Rfl: 0   buPROPion  (WELLBUTRIN  XL) 150 MG 24 hr tablet, Take 150 mg by mouth daily., Disp: , Rfl:    gabapentin  (NEURONTIN ) 300 MG capsule, Take 300 mg by mouth See admin instructions. 2-3 times daily as needed, Disp: , Rfl:    methocarbamol  (ROBAXIN ) 500 MG tablet, Take 1 tablet (500 mg total) by mouth 2 (two) times daily as needed for muscle spasms. (Patient taking differently: Take 500 mg by mouth daily as needed for muscle spasms.), Disp: 14 tablet, Rfl: 0   norethindrone  (AYGESTIN ) 5 MG  tablet, Take 1 tablet (5 mg total) by mouth daily. (Patient not taking: Reported on 12/12/2024),  Disp: 30 tablet, Rfl: 3   acetaminophen  (TYLENOL ) 500 MG tablet, Take 1,000 mg by mouth every 6 (six) hours as needed for mild pain (pain score 1-3) or moderate pain (pain score 4-6)., Disp: , Rfl:    HYDROcodone -acetaminophen  (NORCO) 7.5-325 MG tablet, Take 1 tablet by mouth daily as needed for moderate pain (pain score 4-6) or severe pain (pain score 7-10)., Disp: , Rfl:    lidocaine  (LIDODERM ) 5 %, Place 1 patch onto the skin daily. Remove & Discard patch within 12 hours or as directed by MD (Patient not taking: Reported on 12/12/2024), Disp: 10 patch, Rfl: 0   UNABLE TO FIND, Gallilrey 5mg  daily in am, Disp: , Rfl:

## 2024-12-19 ENCOUNTER — Encounter (HOSPITAL_COMMUNITY)
Admission: RE | Admit: 2024-12-19 | Discharge: 2024-12-19 | Disposition: A | Source: Ambulatory Visit | Attending: Gynecologic Oncology

## 2024-12-19 ENCOUNTER — Inpatient Hospital Stay (HOSPITAL_BASED_OUTPATIENT_CLINIC_OR_DEPARTMENT_OTHER): Admitting: Gynecologic Oncology

## 2024-12-19 ENCOUNTER — Inpatient Hospital Stay

## 2024-12-19 ENCOUNTER — Inpatient Hospital Stay: Admitting: Gynecologic Oncology

## 2024-12-19 ENCOUNTER — Other Ambulatory Visit: Payer: Self-pay | Admitting: Gynecologic Oncology

## 2024-12-19 ENCOUNTER — Other Ambulatory Visit: Payer: Self-pay

## 2024-12-19 ENCOUNTER — Encounter: Payer: Self-pay | Admitting: Gynecologic Oncology

## 2024-12-19 ENCOUNTER — Encounter (HOSPITAL_COMMUNITY): Payer: Self-pay

## 2024-12-19 VITALS — BP 168/96 | HR 81 | Temp 98.0°F | Resp 19 | Wt 347.2 lb

## 2024-12-19 DIAGNOSIS — N809 Endometriosis, unspecified: Secondary | ICD-10-CM

## 2024-12-19 DIAGNOSIS — R9389 Abnormal findings on diagnostic imaging of other specified body structures: Secondary | ICD-10-CM

## 2024-12-19 DIAGNOSIS — Z7189 Other specified counseling: Secondary | ICD-10-CM

## 2024-12-19 DIAGNOSIS — N9489 Other specified conditions associated with female genital organs and menstrual cycle: Secondary | ICD-10-CM | POA: Diagnosis not present

## 2024-12-19 DIAGNOSIS — Z01812 Encounter for preprocedural laboratory examination: Secondary | ICD-10-CM | POA: Insufficient documentation

## 2024-12-19 DIAGNOSIS — G8929 Other chronic pain: Secondary | ICD-10-CM

## 2024-12-19 HISTORY — DX: Anxiety disorder, unspecified: F41.9

## 2024-12-19 LAB — COMPREHENSIVE METABOLIC PANEL WITH GFR
ALT: 10 U/L (ref 0–44)
AST: 15 U/L (ref 15–41)
Albumin: 4.4 g/dL (ref 3.5–5.0)
Alkaline Phosphatase: 73 U/L (ref 38–126)
Anion gap: 11 (ref 5–15)
BUN: 9 mg/dL (ref 6–20)
CO2: 24 mmol/L (ref 22–32)
Calcium: 9.4 mg/dL (ref 8.9–10.3)
Chloride: 106 mmol/L (ref 98–111)
Creatinine, Ser: 0.71 mg/dL (ref 0.44–1.00)
GFR, Estimated: >60 mL/min
Glucose, Bld: 87 mg/dL (ref 70–99)
Potassium: 4.5 mmol/L (ref 3.5–5.1)
Sodium: 141 mmol/L (ref 135–145)
Total Bilirubin: 0.5 mg/dL (ref 0.0–1.2)
Total Protein: 8.1 g/dL (ref 6.5–8.1)

## 2024-12-19 LAB — CBC
HCT: 43.7 % (ref 36.0–46.0)
Hemoglobin: 13.7 g/dL (ref 12.0–15.0)
MCH: 26.3 pg (ref 26.0–34.0)
MCHC: 31.4 g/dL (ref 30.0–36.0)
MCV: 84 fL (ref 80.0–100.0)
Platelets: 314 K/uL (ref 150–400)
RBC: 5.2 MIL/uL — ABNORMAL HIGH (ref 3.87–5.11)
RDW: 15 % (ref 11.5–15.5)
WBC: 10.3 K/uL (ref 4.0–10.5)
nRBC: 0 % (ref 0.0–0.2)

## 2024-12-19 MED ORDER — ERYTHROMYCIN BASE 500 MG PO TABS: ORAL_TABLET | ORAL | 0 refills | Status: AC

## 2024-12-19 MED ORDER — NEOMYCIN SULFATE 500 MG PO TABS: ORAL_TABLET | ORAL | 0 refills | Status: AC

## 2024-12-19 MED ORDER — BISACODYL 5 MG PO TBEC: DELAYED_RELEASE_TABLET | ORAL | 0 refills | Status: AC

## 2024-12-19 MED ORDER — POLYETHYLENE GLYCOL 3350 17 GM/SCOOP PO POWD: ORAL | 0 refills | Status: AC

## 2024-12-19 NOTE — Progress Notes (Unsigned)
 Gynecologic Oncology Telehealth Note: Gyn-Onc  I connected with Kristin Boyd on 12/19/2024 at  5:00 PM EST by telephone and verified that I am speaking with the correct person using two identifiers.  I discussed the limitations, risks, security and privacy concerns of performing an evaluation and management service by telemedicine and the availability of in-person appointments. I also discussed with the patient that there may be a patient responsible charge related to this service. The patient expressed understanding and agreed to proceed.  Other persons participating in the visit and their role in the encounter: none.  Patient's location: [place] Provider's location: [place]  Reason for Visit: follow-up  Treatment History: Patient's history is notable for an endometrial ablation at the age of 44.  She describes her periods as being quite heavy at this time and occurring sometimes multiple times a month.  She then had intermittent pain after the ablation but then in 2019 started to have progressive pain.  She ultimately underwent imaging with CT scan and pelvic ultrasound in August 2020 which showed an 8.3 x 6.6 x 8.9 cm complex cystic and solid left adnexal mass.  Follow-up ultrasound showed a complex and cystic lesion in the left adnexa with a tubular shape measuring 12.6 x 5.6 x 7.4 cm; mass on imaging was felt to perhaps represent a tubo-ovarian abscess.  CA125 was obtained at that time and was mildly elevated at 51.  The plan at that time was for referral to GYN oncology, which did not appear to have happened.     She then had multiple visits in the emergency department with persistence of the mass noted.  Most recently, she had a consultation with Dr. Court at [facility] with a plan to proceed with surgical excision.  Most recent imaging by ultrasound in mid October showed a left adnexal mass measuring 10.3 x 11.4 x 11.2 cm with associated septation and vascularity although poorly  visualized.  Patient was seen in our emergency department on the fifth of this month.  She underwent CT and pelvic ultrasound which show persistent mass without significant change in size since most recent ultrasound.     11/12/2021: Diagnostic surgery for a complex adnexal mass and thickened endometrium.  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   She was on Orilissa  for some time with good relief but unfortunately this stopped being covered by her insurance.  She then tried norethindrone  (didn't provide much relief).    See in the ED on 2/13 with pain.  Pelvic ultrasound at that time showed complex hypoechoic structure in the left ovary measuring 7.7 x 6.9 x 8.6, possible hemorrhagic cyst or endometrioma.  Uterine fibroid also noted.  Mildly thickened endometrium.   Started Lupron  02/26/23. Had significant improvement in her abdominal pain and repeat ultrasound in August 2024 showed decrease size of left ovarian cystic lesion from 8.6 to 6.6 cm.  Seen by OB/GYN around this time and was started on estrogen for add back therapy.   She did not have any ED visit from 01/2023 to 01/2024 for either abdominal or back pain. Last Lupron  was on 06/19/23.  Patient started therapy for back pain in September 2024, did not follow-up for further sessions.   Patient lost insurance.   Since February 2025, she has had 10 emergency department visits for back pain and 8 for abdominal pain.  She has had an additional 3 visits for vaginal bleeding.  Reestablished care with me in mid September.  Given change in insurance, we discussed restarting Lupron  as she had had good relief with this medication.  We discussed doing this through her OB/GYN's office versus our clinic and adding Aygestin  for add back therapy.  We also discussed her vaginal bleeding, suggestive of anovulatory bleeding.  Given known  cervical stenosis and inability to sample her endometrium several years ago, discussed either attempted endometrial biopsy versus getting an MRI to better evaluate the lining of her uterus.   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  No discrete uterine myometrial lesion seen, no enhancing component within the endometrium.  Right ovary appears normal.  There is a 9.3 x 11.4 cm hyperintense lesion within the left adnexa exhibiting T2 bleeding without normal left parenchyma noted.  Lesion does not exhibit enhancement on postcontrast images, no mural nodularity.  Along the anterior aspect of the lesion is a nonenhancing dilated fallopian tube.   08/2024: NILM pap, HR HPV neg   09/14/2024: Lupron  injection   She had visits to the emergency department on 10/14, 11/3, and 11/5.  No further visits since then.  Interval History: Doing ok. Pain much improved after her visit with me last week.  Past Medical/Surgical History: Past Medical History:  Diagnosis Date   Anxiety    Arthritis    Chronic knee pain    Chronic pelvic pain in female    Endometriosis    History of kidney stones    Hypertension    Obese    Ovarian cyst    Pneumonia due to COVID-19 virus 05/26/2019   Postoperative state 11/12/2021   Vaginal Pap smear, abnormal     Past Surgical History:  Procedure Laterality Date   CESAREAN SECTION     x 1   CHOLECYSTECTOMY     Laparoscopic   COLONOSCOPY N/A 12/13/2024   Procedure: COLONOSCOPY;  Surgeon: Suzann Inocente HERO, MD;  Location: [facility];  Service: Gastroenterology;  Laterality: N/A;   DILATION AND CURETTAGE OF UTERUS N/A 11/12/2021   Procedure: ATTEMPTED HYSTEROSCOPY;  Surgeon: Viktoria Comer SAUNDERS, MD;  Location: [facility];  Service: Gynecology;  Laterality: N/A;   ENDOMETRIAL ABLATION     POLYPECTOMY  12/13/2024   Procedure:  POLYPECTOMY, INTESTINE;  Surgeon: Suzann Inocente HERO, MD;  Location: [facility];  Service: Gastroenterology;;   WISDOM TOOTH EXTRACTION     2 removed   XI ROBOTIC ASSISTED SALPINGECTOMY N/A 11/12/2021   Procedure: DIAGNOSTIC LAPAROSCOPY, PERITONEAL BIOPSIES;  Surgeon: Viktoria Comer SAUNDERS, MD;  Location: [facility];  Service: Gynecology;  Laterality: N/A;    Family History  Problem Relation Age of Onset   Hypertension Father    Diabetes Father    Colon cancer Neg Hx    Breast cancer Neg Hx    Ovarian cancer Neg Hx    Endometrial cancer Neg Hx    Pancreatic cancer Neg Hx    Prostate cancer Neg Hx     Social History   Socioeconomic History   Marital status: Single    Spouse name: Not on file   Number of children: 2   Years of education: Not on file   Highest education level: Not on file  Occupational History   Not on file  Tobacco Use   Smoking status: Every Day    Types: Cigarettes   Smokeless tobacco: Never  Vaping Use  Vaping status: Never Used  Substance and Sexual Activity   Alcohol use: Yes    Comment: occasional   Drug use: Never   Sexual activity: Not Currently    Birth control/protection: Surgical  Other Topics Concern   Not on file  Social History Narrative   Not on file   Social Drivers of Health   Tobacco Use: High Risk (12/19/2024)   Patient History    Smoking Tobacco Use: Every Day    Smokeless Tobacco Use: Never    Passive Exposure: Not on file  Financial Resource Strain: Low Risk (07/06/2024)   Received from [facility]   Overall Financial Resource Strain (CARDIA)    How hard is it for you to pay for the very basics like food, housing, medical care, and heating?: Not very hard  Food Insecurity: No Food Insecurity (07/06/2024)   Received from [facility]   Epic    Within the past 12 months, you worried that your food would run out before you got the money to buy more.: Never true    Within the past 12 months, the food you bought just didn't last  and you didn't have money to get more.: Never true  Transportation Needs: No Transportation Needs (07/06/2024)   Received from [facility]   Epic    In the past 12 months, has lack of transportation kept you from medical appointments or from getting medications?: No    In the past 12 months, has lack of transportation kept you from meetings, work, or from getting things needed for daily living?: No  Physical Activity: Insufficiently Active (07/06/2024)   Received from [facility]   Exercise Vital Sign    On average, how many days per week do you engage in moderate to strenuous exercise (like a brisk walk)?: 2 days    On average, how many minutes do you engage in exercise at this level?: 10 min  Stress: No Stress Concern Present (07/06/2024)   Received from [facility] - Occupational Stress Questionnaire    Do you feel stress - tense, restless, nervous, or anxious, or unable to sleep at night because your mind is troubled all the time - these days?: Not at all  Social Connections: Socially Integrated (07/06/2024)   Received from [facility]   Social Network    How would you rate your social network (family, work, friends)?: Good participation with social networks  Depression (PHQ2-9): Low Risk (02/04/2023)   Depression (PHQ2-9)    PHQ-2 Score: 0  Alcohol Screen: Not on file  Housing: Low Risk (07/06/2024)   Received from [facility]    In the last 12 months, was there a time when you were not able to pay the mortgage or rent on time?: No    In the past 12 months, how many times have you moved where you were living?: 0    At any time in the past 12 months, were you homeless or living in a shelter (including now)?: No  Utilities: Not At Risk (07/06/2024)   Received from [facility]    In the past 12 months has the electric, gas, oil, or water  company threatened to shut off services in your home?: No  Health Literacy: Not on  file    Current Medications: Current Medications[1]  Review of Symptoms: Pertinent positives as per HPI.  Physical Exam: Deferred given limitations of phone visit.  Laboratory & Radiologic  Studies:    Latest Ref Rng & Units 12/19/2024    9:46 AM 12/15/2024    9:57 AM 10/09/2024   10:30 PM  CBC  WBC 4.0 - 10.5 K/uL 10.3  10.3  11.8   Hemoglobin 12.0 - 15.0 g/dL 86.2  85.3  86.6   Hematocrit 36.0 - 46.0 % 43.7  45.4  43.3   Platelets 150 - 400 K/uL 314  316  320       Latest Ref Rng & Units 12/19/2024    9:46 AM 12/15/2024    9:57 AM 10/09/2024   10:30 PM  BMP  Glucose 70 - 99 mg/dL 87  99  896   BUN 6 - 20 mg/dL 9  10  14    Creatinine 0.44 - 1.00 mg/dL 9.28  9.24  9.20   Sodium 135 - 145 mmol/L 141  140  144   Potassium 3.5 - 5.1 mmol/L 4.5  4.0  4.1   Chloride 98 - 111 mmol/L 106  106  110   CO2 22 - 32 mmol/L 24  23  21    Calcium 8.9 - 10.3 mg/dL 9.4  9.4  9.1     Assessment & Plan: Kristin Boyd is a 44 y.o. woman with Stage IV endometriosis and complex adnexal mass (suspected endometrioma) who presents for treatment planning. Colonoscopy 1/6 without intraluminal endometriosis disease.  Overall doing better. Given Lupron  dose last week with improvement in her pain. She has had no break through bleeding for the last couple of months.   We have discussed extensively options for medical treatment of endometriosis and work-up of AUB. Unfortunately, given prior endometrial ablation, prior attempts at endometrial sampling (including int he OR) have not been successful.   The patient voices understanding of the morbidity of surgery, both given her disease process but also her comorbidities (notably her obesity). We have discussed the possible need for bowel surgery. We reviewed again that decision about leaving one ovary in situ. She would like definitive treatment with removal of both ovaries and tentative plan for estrogen replacement therapy.  I stressed that given likely  multifactorial cause for her pain, this surgery is unlikely to resolve all of the pain she has been having.   We reviewed the plan for robotic assisted versus open hysterectomy, bilateral salpingectomy, unilateral versus bilateral oophorectomy, possible staging, possible bowel surgery. The risks of surgery were discussed in detail and she understands these to include infection; wound separation; hernia; vaginal cuff separation, injury to adjacent organs such as bowel, bladder, blood vessels, ureters and nerves; bleeding which may require blood transfusion; anesthesia risk; thromboembolic events; possible death; unforeseen complications; possible need for re-exploration; medical complications such as heart attack, stroke, pleural effusion and pneumonia; and, if full lymphadenectomy is performed the risk of lymphedema and lymphocyst. The patient will receive DVT and antibiotic prophylaxis as indicated. She voiced a clear understanding. She had the opportunity to ask questions.   I discussed the assessment and treatment plan with the patient. The patient was provided with an opportunity to ask questions and all were answered. The patient agreed with the plan and demonstrated an understanding of the instructions.   The patient was advised to call back or see an in-person evaluation if the symptoms worsen or if the condition fails to improve as anticipated.   22 minutes of total time was spent for this patient encounter, including preparation, phone counseling with the patient and coordination of care, and documentation of the encounter.   Comer  Viktoria, MD  Division of Gynecologic Oncology  Department of Obstetrics and Gynecology  [facility]      [1]  Current Outpatient Medications:    acetaminophen  (TYLENOL ) 500 MG tablet, Take 1,000 mg by mouth every 6 (six) hours as needed for mild pain (pain score 1-3) or moderate pain (pain score 4-6)., Disp: , Rfl:    amLODipine   (NORVASC ) 10 MG tablet, TAKE 1 TABLET(10 MG) BY MOUTH DAILY, Disp: 30 tablet, Rfl: 0   [START ON 12/20/2024] bisacodyl  (DULCOLAX) 5 MG EC tablet, Starting at 9:00 am the day before surgery, take 4 Dulcolax tablets with 2 glasses of clear liquid., Disp: 4 tablet, Rfl: 0   buPROPion  (WELLBUTRIN  XL) 150 MG 24 hr tablet, Take 150 mg by mouth daily., Disp: , Rfl:    [START ON 12/20/2024] erythromycin  base (E-MYCIN ) 500 MG tablet, Take two tablets (1000 mg total) at 2 pm, 3 pm, and 10 pm the day before surgery, Disp: 6 tablet, Rfl: 0   gabapentin  (NEURONTIN ) 300 MG capsule, Take 300 mg by mouth See admin instructions. 2-3 times daily as needed, Disp: , Rfl:    lidocaine  (LIDODERM ) 5 %, Place 1 patch onto the skin daily. Remove & Discard patch within 12 hours or as directed by MD (Patient not taking: Reported on 12/12/2024), Disp: 10 patch, Rfl: 0   methocarbamol  (ROBAXIN ) 500 MG tablet, Take 1 tablet (500 mg total) by mouth 2 (two) times daily as needed for muscle spasms. (Patient taking differently: Take 500 mg by mouth daily as needed for muscle spasms.), Disp: 14 tablet, Rfl: 0   [START ON 12/20/2024] neomycin  (MYCIFRADIN ) 500 MG tablet, Take two tablets (1000 mg total) at 2 pm, 3 pm, and 10 pm the day before surgery, Disp: 6 tablet, Rfl: 0   norethindrone  (AYGESTIN ) 5 MG tablet, Take 1 tablet (5 mg total) by mouth daily. (Patient not taking: Reported on 12/12/2024), Disp: 30 tablet, Rfl: 3   oxyCODONE  (OXY IR/ROXICODONE ) 5 MG immediate release tablet, Take 1 tablet (5 mg total) by mouth every 6 (six) hours as needed for severe pain (pain score 7-10). Do not take and drive. Use caution, may cause drowsiness, Disp: 20 tablet, Rfl: 0   [START ON 12/20/2024] polyethylene glycol powder (MIRALAX ) 17 GM/SCOOP powder, Starting at 11 am on the day before surgery, mix whole bottle of Miralax  in 64 oz of Gatorade and drink one 8 oz glass every 15 minutes until gone., Disp: 238 g, Rfl: 0   UNABLE TO FIND, Gallilrey 5mg  daily  in am, Disp: , Rfl:

## 2024-12-19 NOTE — Patient Instructions (Addendum)
 SURGICAL WAITING ROOM VISITATION Patients having surgery or a procedure may have no more than 2 support people in the waiting area - these visitors may rotate in the visitor waiting room.   If the patient needs to stay at the hospital during part of their recovery, the visitor guidelines for inpatient rooms apply.  PRE-OP VISITATION  Pre-op nurse will coordinate an appropriate time for 1 support person to accompany the patient in pre-op.  This support person may not rotate.  This visitor will be contacted when the time is appropriate for the visitor to come back in the pre-op area.  Temporary Visitor Restrictions   Children ages 81 and under will not be able to visit patients in [facility] under most circumstances. Visitation is not restricted outside of hospitals unless noted otherwise in the [facility] and Location Specific Visitation Guidelines at :       [email or web address]  Visitors with respiratory illnesses are discouraged from visiting and should remain at home.  You are not required to quarantine at this time prior to your surgery. However, you must do this: Hand Hygiene often Do NOT share personal items Notify your provider if you are in close contact with someone who has COVID or you develop fever 100.4 or greater, new onset of sneezing, cough, sore throat, shortness of breath or body aches.  If you test positive for Covid or have been in contact with anyone that has tested positive in the last 10 days please notify you surgeon.    Your procedure is scheduled on:  Wednesday  12-21-2024  Report to [facility] Main Entrance: [address] entrance where the [employer] is available.   Report to admitting at: 11:15    AM  Call this number if you have any questions or problems the morning of surgery [phone]  FOLLOW ANY ADDITIONAL PRE OP INSTRUCTIONS YOU RECEIVED FROM YOUR SURGEON'S OFFICE!!!  Eat a light diet the day before surgery.  Examples including soups,  broths, toast, yogurt, mashed potatoes.  AVOID GAS PRODUCING FOODS. Things to avoid include carbonated beverages (fizzy beverages, sodas), raw fruits and raw vegetables (uncooked), or beans.    DRINK two (2) bottles of Pre-Surgery Clear Ensure drink starting at 6:00 pm the evening prior to your surgery to help prevent dehydration. Increase drinking clear fluids (see list below)      Do not eat food after Midnight the night prior to your surgery/procedure.  After Midnight you may have the following liquids until   10:30  AM DAY OF SURGERY  Clear Liquid Diet Water  Black Coffee (sugar ok, NO MILK/CREAM OR CREAMERS)  Tea (sugar ok, NO MILK/CREAM OR CREAMERS) regular and decaf                             Plain Jell-O  with no fruit (NO RED)                                           Fruit ices (not with fruit pulp, NO RED)                                     Popsicles (NO RED)  Juice: NO CITRUS JUICES: only apple, WHITE grape, WHITE cranberry Sports drinks like Gatorade or Powerade (NO RED)                   The day of surgery:  Drink ONE (1) Pre-Surgery Clear Ensure at  10:30  AM the morning of surgery. Drink in one sitting. Do not sip.  This drink was given to you during your hospital pre-op appointment visit. Nothing else to drink after completing the Pre-Surgery Clear Ensure : No candy, chewing gum or throat lozenges.     Oral Hygiene is also important to reduce your risk of infection.        Remember - BRUSH YOUR TEETH THE MORNING OF SURGERY WITH YOUR REGULAR TOOTHPASTE  Do NOT smoke after Midnight the night before surgery.  STOP TAKING all Vitamins, Herbs and supplements 1 week before your surgery.   Take ONLY these medicines the morning of surgery with A SIP OF WATER : Amlodipine , bupropion , gabapentin , and you may take EITHERTylenol OR Hydrocodone  for pain if needed.                     You may not have any metal on  your body including hair pins, jewelry, and body piercing  Do not wear make-up, lotions, powders, perfumes or deodorant  Do not wear nail polish including gel and S&S, artificial / acrylic nails, or any other type of covering on natural nails including finger and toenails. If you have artificial nails, gel coating, etc., that needs to be removed by a nail salon, Please have this removed prior to surgery. Not doing so may mean that your surgery could be cancelled or delayed if the Surgeon or anesthesia staff feels like they are unable to monitor you safely.   Do not shave 48 hours prior to surgery to avoid nicks in your skin which may contribute to postoperative infections.   Contacts, Hearing Aids, dentures or bridgework may not be worn into surgery. DENTURES WILL BE REMOVED PRIOR TO SURGERY PLEASE DO NOT APPLY Poly grip OR ADHESIVES!!!  You may bring a small overnight bag with you on the day of surgery, only pack items that are not valuable. [facility] IS NOT RESPONSIBLE   FOR VALUABLES THAT ARE LOST OR STOLEN.   Patients discharged on the day of surgery will not be allowed to drive home.  Someone NEEDS to stay with you for the first 24 hours after anesthesia.  Do not bring your home medications to the hospital. The Pharmacy will dispense medications listed on your medication list to you during your admission in the Hospital.  Please read over the following fact sheets you were given: IF YOU HAVE QUESTIONS ABOUT YOUR PRE-OP INSTRUCTIONS, PLEASE CALL [phone]   [facility] - Preparing for Surgery          Before surgery, you can play an important role.  Because skin is not sterile, your skin needs to be as free of germs as possible.  You can reduce the number of germs on your skin by washing with CHG (chlorahexidine gluconate) soap before surgery.  CHG is an antiseptic cleaner which kills germs and bonds with the skin to continue killing germs even after washing. Please DO NOT use if  you have an allergy to CHG or antibacterial soaps.  If your skin becomes reddened/irritated stop using the CHG and inform your nurse when you arrive at Short Stay. Do not shave (including legs and underarms) for  at least 48 hours prior to the first CHG shower.  You may shave your face/neck.  Please follow these instructions carefully:  1.  Shower with CHG Soap the night before surgery ONLY (DO NOT USE THE CHG SOAP THE MORNING OF SURGERY).  2.  If you choose to wash your hair, wash your hair first as usual with your normal  shampoo.  3.  After you shampoo, rinse your hair and body thoroughly to remove the shampoo.                             4.  Use CHG as you would any other liquid soap.  You can apply chg directly to the skin and wash.  Gently with a scrungie or clean washcloth.  5.  Apply the CHG Soap to your body ONLY FROM THE NECK DOWN.   Do not use on face/ open                           Wound or open sores. Avoid contact with eyes, ears mouth and genitals (private parts).                       Wash face,  Genitals (private parts) with your normal soap.             6.  Wash thoroughly, paying special attention to the area where your  surgery  will be performed.  7.  Thoroughly rinse your body with warm water  from the neck down.  8.  DO NOT shower/wash with your normal soap after using and rinsing off the CHG Soap.                9.  Pat yourself dry with a clean towel.            10.  Wear clean pajamas.            11.  Place clean sheets on your bed the night of your first shower and do not  sleep with pets.  Day of Surgery : Do not apply any CHG, lotions/deodorants the morning of surgery.  Please wear clean clothes to the hospital/surgery center.   FAILURE TO FOLLOW THESE INSTRUCTIONS MAY RESULT IN THE CANCELLATION OF YOUR SURGERY  PATIENT SIGNATURE_________________________________  NURSE  SIGNATURE__________________________________  ________________________________________________________________________

## 2024-12-19 NOTE — H&P (View-Only) (Signed)
 Gynecologic Oncology Telehealth Note: Gyn-Onc  I connected with [name] on 12/19/2024 at  5:00 PM EST by telephone and verified that I am speaking with the correct person using two identifiers.  I discussed the limitations, risks, security and privacy concerns of performing an evaluation and management service by telemedicine and the availability of in-person appointments. I also discussed with the patient that there may be a patient responsible charge related to this service. The patient expressed understanding and agreed to proceed.  Other persons participating in the visit and their role in the encounter: none.  Patient's location: [place] Provider's location: [place]  Reason for Visit: follow-up  Treatment History: Patient's history is notable for an endometrial ablation at the age of 42.  She describes her periods as being quite heavy at this time and occurring sometimes multiple times a month.  She then had intermittent pain after the ablation but then in 2019 started to have progressive pain.  She ultimately underwent imaging with CT scan and pelvic ultrasound in August 2020 which showed an 8.3 x 6.6 x 8.9 cm complex cystic and solid left adnexal mass.  Follow-up ultrasound showed a complex and cystic lesion in the left adnexa with a tubular shape measuring 12.6 x 5.6 x 7.4 cm; mass on imaging was felt to perhaps represent a tubo-ovarian abscess.  CA125 was obtained at that time and was mildly elevated at 51.  The plan at that time was for referral to GYN oncology, which did not appear to have happened.     She then had multiple visits in the emergency department with persistence of the mass noted.  Most recently, she had a consultation with Dr. Court at [facility] with a plan to proceed with surgical excision.  Most recent imaging by ultrasound in mid October showed a left adnexal mass measuring 10.3 x 11.4 x 11.2 cm with associated septation and vascularity although poorly  visualized.  Patient was seen in our emergency department on the fifth of this month.  She underwent CT and pelvic ultrasound which show persistent mass without significant change in size since most recent ultrasound.     11/12/2021: Diagnostic surgery for a complex adnexal mass and thickened endometrium.  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   She was on Orilissa  for some time with good relief but unfortunately this stopped being covered by her insurance.  She then tried norethindrone  (didn't provide much relief).    See in the ED on 2/13 with pain.  Pelvic ultrasound at that time showed complex hypoechoic structure in the left ovary measuring 7.7 x 6.9 x 8.6, possible hemorrhagic cyst or endometrioma.  Uterine fibroid also noted.  Mildly thickened endometrium.   Started Lupron  02/26/23. Had significant improvement in her abdominal pain and repeat ultrasound in August 2024 showed decrease size of left ovarian cystic lesion from 8.6 to 6.6 cm.  Seen by OB/GYN around this time and was started on estrogen for add back therapy.   She did not have any ED visit from 01/2023 to 01/2024 for either abdominal or back pain. Last Lupron  was on 06/19/23.  Patient started therapy for back pain in September 2024, did not follow-up for further sessions.   Patient lost insurance.   Since February 2025, she has had 10 emergency department visits for back pain and 8 for abdominal pain.  She has had an additional 3 visits for vaginal bleeding.  Reestablished care with me in mid September.  Given change in insurance, we discussed restarting Lupron  as she had had good relief with this medication.  We discussed doing this through her OB/GYN's office versus our clinic and adding Aygestin  for add back therapy.  We also discussed her vaginal bleeding, suggestive of anovulatory bleeding.  Given known  cervical stenosis and inability to sample her endometrium several years ago, discussed either attempted endometrial biopsy versus getting an MRI to better evaluate the lining of her uterus.   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  No discrete uterine myometrial lesion seen, no enhancing component within the endometrium.  Right ovary appears normal.  There is a 9.3 x 11.4 cm hyperintense lesion within the left adnexa exhibiting T2 bleeding without normal left parenchyma noted.  Lesion does not exhibit enhancement on postcontrast images, no mural nodularity.  Along the anterior aspect of the lesion is a nonenhancing dilated fallopian tube.   08/2024: NILM pap, HR HPV neg   09/14/2024: Lupron  injection   She had visits to the emergency department on 10/14, 11/3, and 11/5.  No further visits since then.  Interval History: Doing ok. Pain much improved after her visit with me last week.  Past Medical/Surgical History: Past Medical History:  Diagnosis Date   Anxiety    Arthritis    Chronic knee pain    Chronic pelvic pain in female    Endometriosis    History of kidney stones    Hypertension    Obese    Ovarian cyst    Pneumonia due to COVID-19 virus 05/26/2019   Postoperative state 11/12/2021   Vaginal Pap smear, abnormal     Past Surgical History:  Procedure Laterality Date   CESAREAN SECTION     x 1   CHOLECYSTECTOMY     Laparoscopic   COLONOSCOPY N/A 12/13/2024   Procedure: COLONOSCOPY;  Surgeon: Suzann Inocente HERO, MD;  Location: [facility];  Service: Gastroenterology;  Laterality: N/A;   DILATION AND CURETTAGE OF UTERUS N/A 11/12/2021   Procedure: ATTEMPTED HYSTEROSCOPY;  Surgeon: Viktoria Comer SAUNDERS, MD;  Location: [facility];  Service: Gynecology;  Laterality: N/A;   ENDOMETRIAL ABLATION     POLYPECTOMY  12/13/2024   Procedure:  POLYPECTOMY, INTESTINE;  Surgeon: Suzann Inocente HERO, MD;  Location: [facility];  Service: Gastroenterology;;   WISDOM TOOTH EXTRACTION     2 removed   XI ROBOTIC ASSISTED SALPINGECTOMY N/A 11/12/2021   Procedure: DIAGNOSTIC LAPAROSCOPY, PERITONEAL BIOPSIES;  Surgeon: Viktoria Comer SAUNDERS, MD;  Location: [facility];  Service: Gynecology;  Laterality: N/A;    Family History  Problem Relation Age of Onset   Hypertension Father    Diabetes Father    Colon cancer Neg Hx    Breast cancer Neg Hx    Ovarian cancer Neg Hx    Endometrial cancer Neg Hx    Pancreatic cancer Neg Hx    Prostate cancer Neg Hx     Social History   Socioeconomic History   Marital status: Single    Spouse name: Not on file   Number of children: 2   Years of education: Not on file   Highest education level: Not on file  Occupational History   Not on file  Tobacco Use   Smoking status: Every Day    Types: Cigarettes   Smokeless tobacco: Never  Vaping Use  Vaping status: Never Used  Substance and Sexual Activity   Alcohol use: Yes    Comment: occasional   Drug use: Never   Sexual activity: Not Currently    Birth control/protection: Surgical  Other Topics Concern   Not on file  Social History Narrative   Not on file   Social Drivers of Health   Tobacco Use: High Risk (12/19/2024)   Patient History    Smoking Tobacco Use: Every Day    Smokeless Tobacco Use: Never    Passive Exposure: Not on file  Financial Resource Strain: Low Risk (07/06/2024)   Received from [facility]   Overall Financial Resource Strain (CARDIA)    How hard is it for you to pay for the very basics like food, housing, medical care, and heating?: Not very hard  Food Insecurity: No Food Insecurity (07/06/2024)   Received from [facility]   Epic    Within the past 12 months, you worried that your food would run out before you got the money to buy more.: Never true    Within the past 12 months, the food you bought just didn't last  and you didn't have money to get more.: Never true  Transportation Needs: No Transportation Needs (07/06/2024)   Received from [facility]   Epic    In the past 12 months, has lack of transportation kept you from medical appointments or from getting medications?: No    In the past 12 months, has lack of transportation kept you from meetings, work, or from getting things needed for daily living?: No  Physical Activity: Insufficiently Active (07/06/2024)   Received from [facility]   Exercise Vital Sign    On average, how many days per week do you engage in moderate to strenuous exercise (like a brisk walk)?: 2 days    On average, how many minutes do you engage in exercise at this level?: 10 min  Stress: No Stress Concern Present (07/06/2024)   Received from [facility] - Occupational Stress Questionnaire    Do you feel stress - tense, restless, nervous, or anxious, or unable to sleep at night because your mind is troubled all the time - these days?: Not at all  Social Connections: Socially Integrated (07/06/2024)   Received from [facility]   Social Network    How would you rate your social network (family, work, friends)?: Good participation with social networks  Depression (PHQ2-9): Low Risk (02/04/2023)   Depression (PHQ2-9)    PHQ-2 Score: 0  Alcohol Screen: Not on file  Housing: Low Risk (07/06/2024)   Received from [facility]    In the last 12 months, was there a time when you were not able to pay the mortgage or rent on time?: No    In the past 12 months, how many times have you moved where you were living?: 0    At any time in the past 12 months, were you homeless or living in a shelter (including now)?: No  Utilities: Not At Risk (07/06/2024)   Received from [facility]    In the past 12 months has the electric, gas, oil, or water  company threatened to shut off services in your home?: No  Health Literacy: Not on  file    Current Medications: Current Medications[1]  Review of Symptoms: Pertinent positives as per HPI.  Physical Exam: Deferred given limitations of phone visit.  Laboratory & Radiologic  Studies:    Latest Ref Rng & Units 12/19/2024    9:46 AM 12/15/2024    9:57 AM 10/09/2024   10:30 PM  CBC  WBC 4.0 - 10.5 K/uL 10.3  10.3  11.8   Hemoglobin 12.0 - 15.0 g/dL 86.2  85.3  86.6   Hematocrit 36.0 - 46.0 % 43.7  45.4  43.3   Platelets 150 - 400 K/uL 314  316  320       Latest Ref Rng & Units 12/19/2024    9:46 AM 12/15/2024    9:57 AM 10/09/2024   10:30 PM  BMP  Glucose 70 - 99 mg/dL 87  99  896   BUN 6 - 20 mg/dL 9  10  14    Creatinine 0.44 - 1.00 mg/dL 9.28  9.24  9.20   Sodium 135 - 145 mmol/L 141  140  144   Potassium 3.5 - 5.1 mmol/L 4.5  4.0  4.1   Chloride 98 - 111 mmol/L 106  106  110   CO2 22 - 32 mmol/L 24  23  21    Calcium 8.9 - 10.3 mg/dL 9.4  9.4  9.1     Assessment & Plan: Kristin Boyd is a 44 y.o. woman with Stage IV endometriosis and complex adnexal mass (suspected endometrioma) who presents for treatment planning. Colonoscopy 1/6 without intraluminal endometriosis disease.  Overall doing better. Given Lupron  dose last week with improvement in her pain. She has had no break through bleeding for the last couple of months.   We have discussed extensively options for medical treatment of endometriosis and work-up of AUB. Unfortunately, given prior endometrial ablation, prior attempts at endometrial sampling (including int he OR) have not been successful.   The patient voices understanding of the morbidity of surgery, both given her disease process but also her comorbidities (notably her obesity). We have discussed the possible need for bowel surgery. We reviewed again that decision about leaving one ovary in situ. She would like definitive treatment with removal of both ovaries and tentative plan for estrogen replacement therapy.  I stressed that given likely  multifactorial cause for her pain, this surgery is unlikely to resolve all of the pain she has been having.   We reviewed the plan for robotic assisted versus open hysterectomy, bilateral salpingectomy, unilateral versus bilateral oophorectomy, possible staging, possible bowel surgery. The risks of surgery were discussed in detail and she understands these to include infection; wound separation; hernia; vaginal cuff separation, injury to adjacent organs such as bowel, bladder, blood vessels, ureters and nerves; bleeding which may require blood transfusion; anesthesia risk; thromboembolic events; possible death; unforeseen complications; possible need for re-exploration; medical complications such as heart attack, stroke, pleural effusion and pneumonia; and, if full lymphadenectomy is performed the risk of lymphedema and lymphocyst. The patient will receive DVT and antibiotic prophylaxis as indicated. She voiced a clear understanding. She had the opportunity to ask questions.   I discussed the assessment and treatment plan with the patient. The patient was provided with an opportunity to ask questions and all were answered. The patient agreed with the plan and demonstrated an understanding of the instructions.   The patient was advised to call back or see an in-person evaluation if the symptoms worsen or if the condition fails to improve as anticipated.   22 minutes of total time was spent for this patient encounter, including preparation, phone counseling with the patient and coordination of care, and documentation of the encounter.   Comer  Viktoria, MD  Division of Gynecologic Oncology  Department of Obstetrics and Gynecology  [facility]      [1]  Current Outpatient Medications:    acetaminophen  (TYLENOL ) 500 MG tablet, Take 1,000 mg by mouth every 6 (six) hours as needed for mild pain (pain score 1-3) or moderate pain (pain score 4-6)., Disp: , Rfl:    amLODipine   (NORVASC ) 10 MG tablet, TAKE 1 TABLET(10 MG) BY MOUTH DAILY, Disp: 30 tablet, Rfl: 0   [START ON 12/20/2024] bisacodyl  (DULCOLAX) 5 MG EC tablet, Starting at 9:00 am the day before surgery, take 4 Dulcolax tablets with 2 glasses of clear liquid., Disp: 4 tablet, Rfl: 0   buPROPion  (WELLBUTRIN  XL) 150 MG 24 hr tablet, Take 150 mg by mouth daily., Disp: , Rfl:    [START ON 12/20/2024] erythromycin  base (E-MYCIN ) 500 MG tablet, Take two tablets (1000 mg total) at 2 pm, 3 pm, and 10 pm the day before surgery, Disp: 6 tablet, Rfl: 0   gabapentin  (NEURONTIN ) 300 MG capsule, Take 300 mg by mouth See admin instructions. 2-3 times daily as needed, Disp: , Rfl:    lidocaine  (LIDODERM ) 5 %, Place 1 patch onto the skin daily. Remove & Discard patch within 12 hours or as directed by MD (Patient not taking: Reported on 12/12/2024), Disp: 10 patch, Rfl: 0   methocarbamol  (ROBAXIN ) 500 MG tablet, Take 1 tablet (500 mg total) by mouth 2 (two) times daily as needed for muscle spasms. (Patient taking differently: Take 500 mg by mouth daily as needed for muscle spasms.), Disp: 14 tablet, Rfl: 0   [START ON 12/20/2024] neomycin  (MYCIFRADIN ) 500 MG tablet, Take two tablets (1000 mg total) at 2 pm, 3 pm, and 10 pm the day before surgery, Disp: 6 tablet, Rfl: 0   norethindrone  (AYGESTIN ) 5 MG tablet, Take 1 tablet (5 mg total) by mouth daily. (Patient not taking: Reported on 12/12/2024), Disp: 30 tablet, Rfl: 3   oxyCODONE  (OXY IR/ROXICODONE ) 5 MG immediate release tablet, Take 1 tablet (5 mg total) by mouth every 6 (six) hours as needed for severe pain (pain score 7-10). Do not take and drive. Use caution, may cause drowsiness, Disp: 20 tablet, Rfl: 0   [START ON 12/20/2024] polyethylene glycol powder (MIRALAX ) 17 GM/SCOOP powder, Starting at 11 am on the day before surgery, mix whole bottle of Miralax  in 64 oz of Gatorade and drink one 8 oz glass every 15 minutes until gone., Disp: 238 g, Rfl: 0   UNABLE TO FIND, Gallilrey 5mg  daily  in am, Disp: , Rfl:

## 2024-12-19 NOTE — Progress Notes (Signed)
 " Assessment and Care Plan   1. Benign essential hypertension (Primary)  Not well-controlled with blood pressure 140/96 in the office today. She is taking amlodipine  10mg  daily as prescribed with no side effects. Denies headache, dizziness, blurred vision, chest pain, shortness of breath, or visual disturbances.  Continue weight loss efforts. Follow up with CL RD for dietary guidance. Watch sodium intake and refrain from adding salt to foods. Choose foods low in fat. Use monounsaturated fats. Encourage moderate exercise 3-4 days per week. Expect improvement with 5-10% weight loss. Reviewed PCP treatment plan with patient to ensure understanding and it's impact related to obesity treatment.    2. Class 3 severe obesity with serious comorbidity and body mass index (BMI) of 50.0 to 59.9 in adult, unspecified obesity type (*)  Slight weight gain since her last appointment. She is taking bupropion  150mg  daily as prescribed but does not note much appetite control. She would like to add [facility] for appetite and craving control.   Continue taking bupropion  150mg  daily as prescribed; refill today. Prescribe Wegovy 0.25mg  weekly injection for better appetite and craving control. Follow up with CL RD for dietary guidance; focus on protein and fiber intake for better appetite control. Continue walking on treadmill 20 minutes 3 days per week.   - WEGOVY 0.25 MG/0.5ML SOAJ injection; Inject 0.5 mLs (0.25 mg dose) into the skin once a week at 0900 for 4 doses.  Dispense: 2 mL; Refill: 0 - buPROPion  HCl (WELLBUTRIN  XL) 150 mg 24 hr tablet; Take one tablet (150 mg dose) by mouth every morning.  Dispense: 30 tablet; Refill: 0   Visit Goals: Drink 64 ounces water  daily Continue walking on treadmill 20 minutes 3 days per week  Short Term 5% Goal:  17.2 lbs Long Term Goal:  295-300 lbs  Weight loss Medication(s):  Bupropion  11/22/24  No topiramate due to kidney stones   [employer] Weight History: 1.    344.1  lbs 11/22/24  2.    346.8 lbs 12/19/24   Amount of weight lost since the last appointment: +2.7 lbs   Total weight lost: +2.7 lbs  Care Plan:   NP: monthly follow up  RD: MNT BH: declines WLS: declines   Risks, benefits, and alternatives of the medications and treatment plan prescribed today were discussed, and patient expressed understanding.   Follow up in about 1 month (around 01/19/2025) for Routine Follow Up, Weight Check In, Review previous week's goals. or as needed if any worsening symptoms or change in condition.    Subjective   Chief Complaint  Patient presents with   Weight Management   HPI:  Kristin Boyd is a 44 y.o. (DOB 11-10-1981) female here for follow-up care as part of plan for obesity management and related co-morbidities.  Kristin Boyd is a 44 y.o.female who presents to the office for weight management follow up and with history of hypertension. This is not well-controlled with blood pressure 140/96 in the office today. She is taking amlodipine  10mg  daily as prescribed with no side effects. Denies headache, dizziness, blurred vision, chest pain, shortness of breath, or visual disturbances.  Kristin Boyd has been participating with [employer] since mid-December. She is taking bupropion  for weight loss at this time but does not note much appetite control with this medication. She is prescribed Wegovy today for better appetite and craving control.   Barriers/Stressors: overwhelmed with upcoming procedure; struggling with weight loss  Successes: went to the gym once  Current exercise: went to the gym last Friday evening -  treadmill x20 minutes then did some calisthenic exercises  Sleep quantity and quality: does not go to sleep at reasonable time and then sleeps until around noon; sleeping 7-8 hours some nights and some nights only sleeping 5-6 hours  Water  consumption: at least 64 ounces water  daily   Past Medical History, Past Surgical History, Past Family History,  Social History, Medications, and Allergies were reviewed and updated as appropriate.  Objective   BP (!) 140/96   Pulse 98   Ht 5' 8.5 (1.74 m)   Wt (!) 346 lb 12.8 oz (157.3 kg)   SpO2 96%   BMI 51.96 kg/m   Physical Exam Constitutional:      Appearance: Normal appearance.  Pulmonary:     Effort: Pulmonary effort is normal.  Neurological:     Mental Status: She is alert and oriented to person, place, and time.  Psychiatric:        Mood and Affect: Mood normal.        Behavior: Behavior normal.       "

## 2024-12-19 NOTE — Consult Note (Signed)
 WOC Nurse requested for preoperative stoma site marking  Discussed surgical procedure and possible stoma creation with patient.  Explained role of the WOC nurse team.  Provided the patient with educational booklet and provided samples of pouching options.  Answered patient's questions.   Examined patient sitting, and standing in order to place the marking in the patient's visual field, away from any creases or abdominal contour issues and within the rectus muscle.  Attempted to mark below the patient's belt line, but this was not possible, since a significant crease occurs lower on the abd which should be avoided if possible.    Marked for colostomy in the LLQ  __6__ cm to the left of the umbilicus and __7__cm above the umbilicus.  Marked for ileostomy in the RLQ  __6__cm to the right of the umbilicus and  __7__ cm above the umbilicus.  Patient's abdomen cleansed with CHG wipes at site markings, allowed to air dry prior to marking. Covered mark with thin film transparent dressing to preserve mark until date of surgery. Provided with marking pen and instructed patient to re-color in the mark if it begins to fade prior to surgery.   Please re-consult if further assistance is needed.  Thank-you,  Stephane Fought MSN, RN, CWOCN, CWCN-AP, CNS Contact Mon-Fri 0700-1500: [phone]

## 2024-12-19 NOTE — Progress Notes (Signed)
 Patient here for a pre-operative appointment prior to her scheduled surgery on 12/21/2024. She is scheduled for a diagnostic laparoscopy, total abdominal hysterectomy, bilateral salpingectomy, possible oophorectomy, possible bowel surgery. The surgery was discussed in detail.  See after visit summary for additional details.   Discussed post-op pain management in detail including the aspects of the enhanced recovery pathway.  Advised her that a new prescription was sent in for Oxycodone  and it is only to be used for after her upcoming surgery.We discussed the use of tylenol  post-op and to monitor for a maximum of 4,000 mg in a 24 hour period.  Also let her know that sennakot will be prescribed to be used after surgery and to hold if having loose stools.  Discussed bowel prep and regimen in detail.     Discussed the use of SCDs and measures to take at home to prevent DVT including frequent mobility.  Reportable signs and symptoms of DVT discussed. Post-operative instructions discussed and expectations for after surgery. Incisional care discussed as well including reportable signs and symptoms including erythema, drainage, wound separation.     30 minutes spent with the patient.  Verbalizing understanding of material discussed. No needs or concerns voiced at the end of the visit.   Advised patient to call for any needs.  Advised that her post-operative medications had been prescribed and could be picked up at any time.    This appointment is included in the global surgical bundle as pre-operative teaching and has no charge.

## 2024-12-19 NOTE — Patient Instructions (Signed)
 Preparing for your Surgery  Plan for surgery on 12/21/2024 with Dr. Comer Dollar at [facility]. You will be scheduled for diagnostic laparoscopy, total abdominal hysterectomy, bilateral salpingectomy, possible oophorectomy, possible bowel surgery.  We will have you meet with the ostomy nurse before surgery to be marked on your abdomen for possible ostomy if needed.    Pre-operative Testing -(Done on 12/15/24) You will receive a phone call from presurgical testing at [facility] to arrange for a pre-operative appointment and lab work.  -Bring your insurance card, copy of an advanced directive if applicable, medication list  -At that visit, you will be asked to sign a consent for a possible blood transfusion in case a transfusion becomes necessary during surgery.  The need for a blood transfusion is rare but having consent is a necessary part of your care.     -You should not be taking blood thinners or aspirin at least ten days prior to surgery unless instructed by your surgeon.  -Do not take supplements such as fish oil (omega 3), red yeast rice, turmeric before your surgery. STOP TAKING AT LEAST 10 DAYS BEFORE SURGERY. You want to avoid medications with aspirin in them including headache powders such as BC or Goody's), Excedrin migraine.  -If you are taking a GLP-1 medication/injection such as Ozempic, Mounjaro, [id number], this needs to be held before surgery for at least 7 days before.  Day Before Surgery at Home -SEE BOWEL PREP INSTRUCTIONS. You will be advised you can have clear liquids up until 3 hours before your surgery. Avoid carbonated beverages.    If your bowels are filled with gas, your surgeon will have difficulty visualizing your pelvic organs which increases your surgical risks.  Your role in recovery Your role is to become active as soon as directed by your doctor, while still giving yourself time to heal.  Rest when you feel tired. You will be asked to do  the following in order to speed your recovery:  - Cough and breathe deeply. This helps to clear and expand your lungs and can prevent pneumonia after surgery.  - STAY ACTIVE WHEN YOU GET HOME. Do mild physical activity. Walking or moving your legs help your circulation and body functions return to normal. Do not try to get up or walk alone the first time after surgery.   -If you develop swelling on one leg or the other, pain in the back of your leg, redness/warmth in one of your legs, please call the office or go to the Emergency Room to have a doppler to rule out a blood clot. For shortness of breath, chest pain-seek care in the Emergency Room as soon as possible. - Actively manage your pain. Managing your pain lets you move in comfort. We will ask you to rate your pain on a scale of zero to 10. It is your responsibility to tell your doctor or nurse where and how much you hurt so your pain can be treated.  Special Considerations -If you are diabetic, you may be placed on insulin after surgery to have closer control over your blood sugars to promote healing and recovery.  This does not mean that you will be discharged on insulin.  If applicable, your oral antidiabetics will be resumed when you are tolerating a solid diet.  -Your final pathology results from surgery should be available around one week after surgery and the results will be relayed to you when available.  -FMLA forms can be faxed to [phone]  and please allow 5-7 business days for completion.  Pain Management After Surgery -You will be prescribed your pain medication and bowel regimen medications before surgery so that you can have these available when you are discharged from the hospital. The pain medication is for use ONLY AFTER surgery and a new prescription will not be given.   -Make sure that you have Tylenol  and Ibuprofen  IF YOU ARE ABLE TO TAKE THESE MEDICATIONS at home to use on a regular basis after surgery for pain  control. We recommend alternating the medications every hour to six hours since they work differently and are processed in the body differently for pain relief.  -Review the attached handout on narcotic use and their risks and side effects.   Bowel Regimen -You will be prescribed Sennakot-S to take nightly to prevent constipation especially if you are taking the narcotic pain medication intermittently.  It is important to prevent constipation and drink adequate amounts of liquids. You can stop taking this medication when you are not taking pain medication and you are back on your normal bowel routine.  Risks of Surgery Risks of surgery are low but include bleeding, infection, damage to surrounding structures, re-operation, blood clots, and very rarely death.   Blood Transfusion Information (For the consent to be signed before surgery)  We will be checking your blood type before surgery so in case of emergencies, we will know what type of blood you would need.                                            WHAT IS A BLOOD TRANSFUSION?  A transfusion is the replacement of blood or some of its parts. Blood is made up of multiple cells which provide different functions. Red blood cells carry oxygen  and are used for blood loss replacement. White blood cells fight against infection. Platelets control bleeding. Plasma helps clot blood. Other blood products are available for specialized needs, such as hemophilia or other clotting disorders. BEFORE THE TRANSFUSION  Who gives blood for transfusions?  You may be able to donate blood to be used at a later date on yourself (autologous donation). Relatives can be asked to donate blood. This is generally not any safer than if you have received blood from a stranger. The same precautions are taken to ensure safety when a relative's blood is donated. Healthy volunteers who are fully evaluated to make sure their blood is safe. This is blood bank  blood. Transfusion therapy is the safest it has ever been in the practice of medicine. Before blood is taken from a donor, a complete history is taken to make sure that person has no history of diseases nor engages in risky social behavior (examples are intravenous drug use or sexual activity with multiple partners). The donor's travel history is screened to minimize risk of transmitting infections, such as malaria. The donated blood is tested for signs of infectious diseases, such as HIV and hepatitis. The blood is then tested to be sure it is compatible with you in order to minimize the chance of a transfusion reaction. If you or a relative donates blood, this is often done in anticipation of surgery and is not appropriate for emergency situations. It takes many days to process the donated blood. RISKS AND COMPLICATIONS Although transfusion therapy is very safe and saves many lives, the main dangers of transfusion include:  Getting an infectious disease. Developing a transfusion reaction. This is an allergic reaction to something in the blood you were given. Every precaution is taken to prevent this. The decision to have a blood transfusion has been considered carefully by your caregiver before blood is given. Blood is not given unless the benefits outweigh the risks.  AFTER SURGERY INSTRUCTIONS  Return to work: 4-6 weeks if applicable  You will have a white honeycomb dressing over your larger incision. This dressing can be removed 5 days after surgery and you do not need to reapply a new dressing. Once you remove the dressing, you will notice that you have the surgical glue (dermabond) on the incision and this will peel off on its own. You can get this dressing wet in the shower the days after surgery prior to removal on the 5th day.   Activity: 1. Be up and out of the bed during the day.  Take a nap if needed.  You may walk up steps but be careful and use the hand rail.  Stair climbing will tire  you more than you think, you may need to stop part way and rest.   2. No lifting or straining for 6 weeks over 10 pounds. No pushing, pulling, straining for 6 weeks.  3. No driving for 4-89 days when the following criteria have been met: Do not drive if you are taking narcotic pain medicine and make sure that your reaction time has returned.   4. You can shower as soon as the next day after surgery. Shower daily.  Use your regular soap and water  (not directly on the incision) and pat your incision(s) dry afterwards; don't rub.  No tub baths or submerging your body in water  until cleared by your surgeon. If you have the soap that was given to you by pre-surgical testing that was used before surgery, you do not need to use it afterwards because this can irritate your incisions.   5. No sexual activity and nothing in the vagina for 12 weeks.  6. You may experience a small amount of clear drainage from your incisions, which is normal.  If the drainage persists, increases, or changes color please call the office.  7. Do not use creams, lotions, or ointments such as neosporin on your incisions after surgery until advised by your surgeon because they can cause removal of the dermabond glue on your incisions.    8. You may experience vaginal spotting after surgery or when the stitches at the top of the vagina begin to dissolve.  The spotting is normal but if you experience heavy bleeding, call our office.  9. Take Tylenol  or ibuprofen  first for pain if you are able to take these medications and only use narcotic pain medication for severe pain not relieved by the Tylenol  or Ibuprofen .  Monitor your Tylenol  intake to a max of 4,000 mg in a 24 hour period. You can alternate these medications after surgery.  Diet: 1. Low sodium Heart Healthy Diet is recommended but you are cleared to resume your normal (before surgery) diet after your procedure.  2. It is safe to use a laxative, such as Miralax or Colace,  if you have difficulty moving your bowels before surgery. You have been prescribed Sennakot-S to take at bedtime every evening after surgery to keep bowel movements regular and to prevent constipation.    Wound Care: 1. Keep clean and dry.  Shower daily.  Reasons to call the Doctor: Fever - Oral temperature  greater than 100.4 degrees Fahrenheit Foul-smelling vaginal discharge Difficulty urinating Nausea and vomiting Increased pain at the site of the incision that is unrelieved with pain medicine. Difficulty breathing with or without chest pain New calf pain especially if only on one side Sudden, continuing increased vaginal bleeding with or without clots.   Contacts: For questions or concerns you should contact:  Dr. Comer Dollar at [phone]  [name], NP at [phone]  After Hours: call [phone] and have the GYN Oncologist paged/contacted (after 5 pm or on the weekends). You will speak with an after hours RN and let he or she know you have had surgery.  Messages sent via mychart are for non-urgent matters and are not responded to after hours so for urgent needs, please call the after hours number.  GYN Oncology Bowel Preparation for surgery   There are two important steps to take to prepare for your surgery:   1. Cleansing of your colon: all the stool is washed out of your colon.   2. Take antibiotics: taken by mouth to help prevent infection.   INSTRUCTIONS:   As Soon As Possible (at least 2-3 days BEFORE your surgery)   Please buy the following (5) items from a pharmacy:   The first two items are antibiotics. The first four items will be sent in as prescriptions and you can get the G2 or powerade at the drug store or grocery store.   1. Erythromycin pills - 1 gram, 3 doses (antibiotic)   2. Neomycin pills - 1 gram, 3 doses (antibiotic)   The next 2 items are laxatives and work to cleanse your colon.   3. 4 Dulcolax 5 mg tablets   4. 238 grams of  Miralax (8.3 ounce bottle)   5. 64 oz of Gatorade or Powerade (not red)   (NOTE: If you are allergic to any of these medications, the prep will NOT be prescribed for your. Please let your physician know of any allergies.)   The Day Before Your Surgery   1. Do NOT eat any solid food. Do NOT drink unfiltered juices such as apple cider. Drink only clear liquids such as juice, black coffee, tea, sports drinks, soda pop, Jell-O, water . Please refer to handout from the pre-care suite for more details).   2. Starting at 9:00 a.m.: Take 4 Dulcolax tablets with 2 glasses of clear liquid.   3. 11:00 a.m.: Mix whole bottle of Miralax in 64 oz of Gatorade and drink one 8 oz glass every 15 minutes until gone.   4. When you have finished the Miralax, drink at least 4 glasses of clear liquids of your choice. You will start experiencing diarrhea anywhere between 30 minutes to 3 hours after completing the Miralax. Keep drinking plenty of clear liquids throughout the day. This will keep you from getting dehydrated from the diarrhea.   5. Take your antibiotics by mouth at these times after you complete the Miralax:   - At 2 p.m.: Take Erythromycin (1 gram) and Neomycin (1 gram)   - At 3 p.m.: Take Erythromycin (1 gram) and Neomycin (1 gram)   - At 10 p.m.: Take Erythromycin (1 gram) and Neomycin (1 gram)   The Day of Your Surgery   On the morning of your surgery, only take the medicines that the pre-care suite told you were okay to take. Take them only with a sip of water .   Special Instructions:   - Please be sure you make your surgeon aware if  you have diabetes. Your diabetic medications may need to be adjusted.   - If you feel dizzy or have severe nausea, vomiting or abdominal pain, or if you cannot finish drinking the bowel prep, please call the Gynecologic Oncology clinic at [phone] or your surgeon.   - If you have any life-threatening symptoms including wheezing, chest tightness, fever,  swelling of your face, lips, tongue or throat, call 9-1-1- right away.   Questions?   Please call if you have questions or concerns:   - Weekdays 8:00 a.m. to 5:00 pm: Call the office at [phone]   - After hours and on weekends and holidays, call the paging operator at [phone] and ask for the GYN ONC on call to be paged.

## 2024-12-19 NOTE — Progress Notes (Signed)
 COVID Vaccine received:  [x]  No []  Yes Date of any COVID positive Test in last 90 days:  none  PCP - Chianne Kline PA-C  Cardiologist -   Chest x-ray -  EKG -  10-11-2024  Epic Stress Test -  ECHO -  Cardiac Cath -  CT Coronary Calcium score:   Pacemaker / ICD device [x]  No []  Yes   Spinal Cord Stimulator:[x]  No []  Yes       History of Sleep Apnea? [x]  No []  Yes   no sleep studies CPAP used?- [x]  No []  Yes    Patient has: [x]  NO Hx DM   []  Pre-DM   []  DM1  []   DM2 Does the patient monitor blood sugar?   [x]  N/A   []  No []  Yes   Blood Thinner / Instructions: none Aspirin Instructions:  none  Activity level: Able to walk up 2 flights of stairs without becoming significantly short of breath or having chest pain?   [x]    Yes   []  No,  would have:  Patient can perform ADLs without assistance.  [x]   Yes    []  No   Anesthesia review: HTN, anxiety, smokes 3 cigs per day,   Patient denies any S&S of respiratory illness or Covid - no shortness of breath, fever, cough or chest pain at PAT appointment.  Patient verbalized understanding and agreement to the Pre-Surgical Instructions that were given to them at this PAT appointment. Patient was also educated of the need to review these PAT instructions again prior to her surgery.I reviewed the appropriate phone numbers to call if they have any and questions or concerns.

## 2024-12-20 ENCOUNTER — Telehealth: Payer: Self-pay | Admitting: *Deleted

## 2024-12-20 NOTE — Telephone Encounter (Signed)
 Telephone call to check on pre-operative status.  Patient compliant with pre-operative instructions and all bowel prep instructions. Clear liquids only until 0600. Patient to arrive at 0700.  No questions or concerns voiced.  Instructed to call for any needs.

## 2024-12-20 NOTE — Anesthesia Preprocedure Evaluation (Addendum)
 "                                  Anesthesia Evaluation  Patient identified by MRN, date of birth, ID band Patient awake    Reviewed: Allergy & Precautions, H&P , NPO status , Patient's Chart, lab work & pertinent test results  History of Anesthesia Complications Negative for: history of anesthetic complications  Airway Mallampati: II  TM Distance: >3 FB Neck ROM: Full    Dental no notable dental hx. (+) Teeth Intact, Dental Advisory Given   Pulmonary neg pneumonia , Current Smoker and Patient abstained from smoking.   Pulmonary exam normal breath sounds clear to auscultation       Cardiovascular hypertension, Pt. on medications (-) angina (-) Past MI Normal cardiovascular exam Rhythm:Regular Rate:Normal     Neuro/Psych neg Seizures  Anxiety     negative neurological ROS  negative psych ROS   GI/Hepatic negative GI ROS, Neg liver ROS,,,  Endo/Other  negative endocrine ROS  Class 4 obesity  Renal/GU negative Renal ROSLab Results      Component                Value               Date                          K                        4.5                 12/19/2024                     CREATININE               0.71                12/19/2024                negative genitourinary   Musculoskeletal  (+) Arthritis ,    Abdominal   Peds negative pediatric ROS (+)  Hematology negative hematology ROS (+) Lab Results      Component                Value               Date                      WBC                      10.3                12/19/2024                HGB                      13.7                12/19/2024                HCT                      43.7                12/19/2024  MCV                      84.0                12/19/2024                PLT                      314                 12/19/2024              Anesthesia Other Findings All PCN  Reproductive/Obstetrics negative OB ROS                               Anesthesia Physical Anesthesia Plan  ASA: 3  Anesthesia Plan: General   Post-op Pain Management: Ketamine  IV* and Lidocaine  infusion*   Induction: Intravenous  PONV Risk Score and Plan: 4 or greater and Treatment may vary due to age or medical condition, Ondansetron , Midazolam  and Dexamethasone   Airway Management Planned: Oral ETT  Additional Equipment: None  Intra-op Plan:   Post-operative Plan: Extubation in OR  Informed Consent: I have reviewed the patients History and Physical, chart, labs and discussed the procedure including the risks, benefits and alternatives for the proposed anesthesia with the patient or authorized representative who has indicated his/her understanding and acceptance.     Dental advisory given  Plan Discussed with: CRNA and Surgeon  Anesthesia Plan Comments:          Anesthesia Quick Evaluation  "

## 2024-12-21 ENCOUNTER — Encounter (HOSPITAL_COMMUNITY): Payer: Self-pay | Admitting: Medical

## 2024-12-21 ENCOUNTER — Inpatient Hospital Stay (HOSPITAL_COMMUNITY)
Admission: RE | Admit: 2024-12-21 | Discharge: 2024-12-25 | DRG: 743 | Disposition: A | Discharge status: Home / Self Care | Attending: Gynecologic Oncology | Admitting: Gynecologic Oncology

## 2024-12-21 ENCOUNTER — Inpatient Hospital Stay (HOSPITAL_COMMUNITY): Payer: Self-pay | Admitting: Anesthesiology

## 2024-12-21 ENCOUNTER — Other Ambulatory Visit: Payer: Self-pay

## 2024-12-21 ENCOUNTER — Encounter (HOSPITAL_COMMUNITY): Payer: Self-pay | Admitting: Gynecologic Oncology

## 2024-12-21 ENCOUNTER — Encounter: Payer: Self-pay | Admitting: Gynecologic Oncology

## 2024-12-21 ENCOUNTER — Encounter (HOSPITAL_COMMUNITY)
Admission: RE | Disposition: A | Discharge status: Home / Self Care | Payer: Self-pay | Source: Home / Self Care | Attending: Gynecologic Oncology

## 2024-12-21 DIAGNOSIS — E860 Dehydration: Secondary | ICD-10-CM | POA: Diagnosis present

## 2024-12-21 DIAGNOSIS — N80122 Deep endometriosis of left ovary: Secondary | ICD-10-CM | POA: Diagnosis present

## 2024-12-21 DIAGNOSIS — Z8249 Family history of ischemic heart disease and other diseases of the circulatory system: Secondary | ICD-10-CM | POA: Diagnosis not present

## 2024-12-21 DIAGNOSIS — F419 Anxiety disorder, unspecified: Secondary | ICD-10-CM | POA: Diagnosis not present

## 2024-12-21 DIAGNOSIS — N809 Endometriosis, unspecified: Secondary | ICD-10-CM | POA: Diagnosis present

## 2024-12-21 DIAGNOSIS — Z6841 Body Mass Index (BMI) 40.0 and over, adult: Secondary | ICD-10-CM

## 2024-12-21 DIAGNOSIS — F32A Depression, unspecified: Secondary | ICD-10-CM | POA: Diagnosis present

## 2024-12-21 DIAGNOSIS — K660 Peritoneal adhesions (postprocedural) (postinfection): Secondary | ICD-10-CM | POA: Diagnosis present

## 2024-12-21 DIAGNOSIS — N9489 Other specified conditions associated with female genital organs and menstrual cycle: Secondary | ICD-10-CM

## 2024-12-21 DIAGNOSIS — I10 Essential (primary) hypertension: Secondary | ICD-10-CM

## 2024-12-21 HISTORY — PX: LAPAROSCOPY: SHX197

## 2024-12-21 HISTORY — PX: ROBOTIC ASSISTED TOTAL HYSTERECTOMY WITH BILATERAL SALPINGO OOPHERECTOMY: SHX6086

## 2024-12-21 LAB — CBC
HCT: 46.5 % — ABNORMAL HIGH (ref 36.0–46.0)
Hemoglobin: 14.6 g/dL (ref 12.0–15.0)
MCH: 26.4 pg (ref 26.0–34.0)
MCHC: 31.4 g/dL (ref 30.0–36.0)
MCV: 84.2 fL (ref 80.0–100.0)
Platelets: 309 K/uL (ref 150–400)
RBC: 5.52 MIL/uL — ABNORMAL HIGH (ref 3.87–5.11)
RDW: 14.7 % (ref 11.5–15.5)
WBC: 19.5 K/uL — ABNORMAL HIGH (ref 4.0–10.5)
nRBC: 0 % (ref 0.0–0.2)

## 2024-12-21 LAB — TYPE AND SCREEN
ABO/RH(D): O POS
Antibody Screen: NEGATIVE

## 2024-12-21 LAB — BASIC METABOLIC PANEL WITH GFR
Anion gap: 14 (ref 5–15)
BUN: 11 mg/dL (ref 6–20)
CO2: 20 mmol/L — ABNORMAL LOW (ref 22–32)
Calcium: 9 mg/dL (ref 8.9–10.3)
Chloride: 102 mmol/L (ref 98–111)
Creatinine, Ser: 1.29 mg/dL — ABNORMAL HIGH (ref 0.44–1.00)
GFR, Estimated: 52 mL/min — ABNORMAL LOW
Glucose, Bld: 136 mg/dL — ABNORMAL HIGH (ref 70–99)
Potassium: 4.2 mmol/L (ref 3.5–5.1)
Sodium: 136 mmol/L (ref 135–145)

## 2024-12-21 MED ORDER — PHENYLEPHRINE 80 MCG/ML (10ML) SYRINGE FOR IV PUSH (FOR BLOOD PRESSURE SUPPORT)
PREFILLED_SYRINGE | INTRAVENOUS | Status: AC
  Filled 2024-12-21: qty 30

## 2024-12-21 MED ORDER — METRONIDAZOLE 500 MG/100ML IV SOLN
INTRAVENOUS | Status: AC
  Filled 2024-12-21: qty 100

## 2024-12-21 MED ORDER — GABAPENTIN 100 MG PO CAPS: 300.0000 mg | ORAL_CAPSULE | Freq: Three times a day (TID) | ORAL | Status: DC | PRN

## 2024-12-21 MED ORDER — BUPIVACAINE HCL (PF) 0.25 % IJ SOLN
INTRAMUSCULAR | Status: DC | PRN
  Administered 2024-12-21: 30 mL

## 2024-12-21 MED ORDER — ROCURONIUM BROMIDE 100 MG/10ML IV SOLN
INTRAVENOUS | Status: DC | PRN
  Administered 2024-12-21: 20 mg via INTRAVENOUS
  Administered 2024-12-21: 15 mg via INTRAVENOUS
  Administered 2024-12-21: 80 mg via INTRAVENOUS

## 2024-12-21 MED ORDER — KCL IN DEXTROSE-NACL 20-5-0.45 MEQ/L-%-% IV SOLN
INTRAVENOUS | Status: DC
  Filled 2024-12-21 (×3): qty 1000

## 2024-12-21 MED ORDER — DEXAMETHASONE SOD PHOSPHATE PF 10 MG/ML IJ SOLN
INTRAMUSCULAR | Status: AC
  Filled 2024-12-21: qty 1

## 2024-12-21 MED ORDER — SENNOSIDES-DOCUSATE SODIUM 8.6-50 MG PO TABS
2.0000 | ORAL_TABLET | Freq: Every day | ORAL | Status: DC
  Administered 2024-12-21 – 2024-12-24 (×4): 2 via ORAL
  Filled 2024-12-21 (×4): qty 2

## 2024-12-21 MED ORDER — LABETALOL HCL 5 MG/ML IV SOLN
INTRAVENOUS | Status: AC
  Filled 2024-12-21: qty 4

## 2024-12-21 MED ORDER — OXYCODONE HCL 5 MG PO TABS: 5.0000 mg | ORAL_TABLET | Freq: Once | ORAL | Status: DC | PRN

## 2024-12-21 MED ORDER — BUPROPION HCL ER (XL) 150 MG PO TB24
150.0000 mg | ORAL_TABLET | Freq: Every day | ORAL | Status: DC
  Administered 2024-12-22 – 2024-12-25 (×4): 150 mg via ORAL
  Filled 2024-12-21 (×4): qty 1

## 2024-12-21 MED ORDER — LIDOCAINE HCL (PF) 2 % IJ SOLN
INTRAMUSCULAR | Status: AC
  Filled 2024-12-21: qty 10

## 2024-12-21 MED ORDER — ACETAMINOPHEN 500 MG PO TABS
1000.0000 mg | ORAL_TABLET | ORAL | Status: AC
  Administered 2024-12-21: 1000 mg via ORAL
  Filled 2024-12-21: qty 2

## 2024-12-21 MED ORDER — FENTANYL CITRATE (PF) 100 MCG/2ML IJ SOLN
INTRAMUSCULAR | Status: AC
  Filled 2024-12-21: qty 2

## 2024-12-21 MED ORDER — GENTAMICIN SULFATE 40 MG/ML IJ SOLN
5.0000 mg/kg | INTRAVENOUS | Status: AC
  Administered 2024-12-21: 510 mg via INTRAVENOUS
  Filled 2024-12-21: qty 12.75

## 2024-12-21 MED ORDER — LIDOCAINE HCL (PF) 2 % IJ SOLN
INTRAMUSCULAR | Status: AC
  Filled 2024-12-21: qty 5

## 2024-12-21 MED ORDER — ENSURE PRE-SURGERY PO LIQD: 592.0000 mL | Freq: Once | ORAL | Status: DC

## 2024-12-21 MED ORDER — ONDANSETRON HCL 4 MG/2ML IJ SOLN
INTRAMUSCULAR | Status: AC
  Filled 2024-12-21: qty 2

## 2024-12-21 MED ORDER — LIDOCAINE HCL (PF) 2 % IJ SOLN
INTRAMUSCULAR | Status: DC | PRN
  Administered 2024-12-21: .5 mg/kg/h via INTRADERMAL

## 2024-12-21 MED ORDER — FENTANYL CITRATE (PF) 50 MCG/ML IJ SOSY
25.0000 ug | PREFILLED_SYRINGE | INTRAMUSCULAR | Status: DC | PRN
  Administered 2024-12-21 (×3): 50 ug via INTRAVENOUS

## 2024-12-21 MED ORDER — ACETAMINOPHEN 500 MG PO TABS
1000.0000 mg | ORAL_TABLET | Freq: Four times a day (QID) | ORAL | Status: DC
  Administered 2024-12-21 – 2024-12-25 (×14): 1000 mg via ORAL
  Filled 2024-12-21 (×14): qty 2

## 2024-12-21 MED ORDER — MIDAZOLAM HCL 2 MG/2ML IJ SOLN
INTRAMUSCULAR | Status: AC
  Filled 2024-12-21: qty 2

## 2024-12-21 MED ORDER — SUGAMMADEX SODIUM 200 MG/2ML IV SOLN
INTRAVENOUS | Status: AC
  Filled 2024-12-21: qty 2

## 2024-12-21 MED ORDER — HEPARIN SODIUM (PORCINE) 5000 UNIT/ML IJ SOLN
5000.0000 [IU] | INTRAMUSCULAR | Status: AC
  Administered 2024-12-21: 5000 [IU] via SUBCUTANEOUS
  Filled 2024-12-21: qty 1

## 2024-12-21 MED ORDER — FUROSEMIDE 10 MG/ML IJ SOLN
INTRAMUSCULAR | Status: DC | PRN
  Administered 2024-12-21: 10 mg via INTRAMUSCULAR

## 2024-12-21 MED ORDER — SIMETHICONE 80 MG PO CHEW: 80.0000 mg | CHEWABLE_TABLET | Freq: Four times a day (QID) | ORAL | Status: DC | PRN

## 2024-12-21 MED ORDER — PHENYLEPHRINE 80 MCG/ML (10ML) SYRINGE FOR IV PUSH (FOR BLOOD PRESSURE SUPPORT)
PREFILLED_SYRINGE | INTRAVENOUS | Status: DC | PRN
  Administered 2024-12-21 (×2): 160 ug via INTRAVENOUS
  Administered 2024-12-21: 80 ug via INTRAVENOUS
  Administered 2024-12-21 (×2): 160 ug via INTRAVENOUS
  Administered 2024-12-21: 80 ug via INTRAVENOUS
  Administered 2024-12-21 (×2): 160 ug via INTRAVENOUS
  Administered 2024-12-21 (×2): 80 ug via INTRAVENOUS

## 2024-12-21 MED ORDER — FENTANYL CITRATE (PF) 100 MCG/2ML IJ SOLN
INTRAMUSCULAR | Status: DC | PRN
  Administered 2024-12-21 (×3): 50 ug via INTRAVENOUS

## 2024-12-21 MED ORDER — SODIUM CHLORIDE (PF) 0.9 % IJ SOLN
INTRAMUSCULAR | Status: DC | PRN
  Administered 2024-12-21: 20 mL

## 2024-12-21 MED ORDER — TRAMADOL HCL 50 MG PO TABS: 50.0000 mg | ORAL_TABLET | Freq: Four times a day (QID) | ORAL | Status: DC | PRN

## 2024-12-21 MED ORDER — POVIDONE-IODINE 10 % EX SWAB
2.0000 | Freq: Once | CUTANEOUS | Status: AC
  Administered 2024-12-21: 2 via TOPICAL

## 2024-12-21 MED ORDER — BUPIVACAINE LIPOSOME 1.3 % IJ SUSP
INTRAMUSCULAR | Status: AC
  Filled 2024-12-21: qty 20

## 2024-12-21 MED ORDER — METRONIDAZOLE 500 MG/100ML IV SOLN
INTRAVENOUS | Status: DC | PRN
  Administered 2024-12-21: 500 mg via INTRAVENOUS

## 2024-12-21 MED ORDER — EPHEDRINE 5 MG/ML INJ
INTRAVENOUS | Status: AC
  Filled 2024-12-21: qty 5

## 2024-12-21 MED ORDER — LACTATED RINGERS IV SOLN: INTRAVENOUS | Status: DC

## 2024-12-21 MED ORDER — PROPOFOL 10 MG/ML IV BOLUS
INTRAVENOUS | Status: AC
  Filled 2024-12-21: qty 20

## 2024-12-21 MED ORDER — KETAMINE HCL 50 MG/5ML IJ SOSY
PREFILLED_SYRINGE | INTRAMUSCULAR | Status: AC
  Filled 2024-12-21: qty 5

## 2024-12-21 MED ORDER — ENSURE PRE-SURGERY PO LIQD: 296.0000 mL | Freq: Once | ORAL | Status: DC

## 2024-12-21 MED ORDER — ONDANSETRON HCL 4 MG/2ML IJ SOLN
4.0000 mg | Freq: Four times a day (QID) | INTRAMUSCULAR | Status: DC | PRN
  Administered 2024-12-24: 4 mg via INTRAVENOUS
  Filled 2024-12-21: qty 2

## 2024-12-21 MED ORDER — OXYCODONE HCL 5 MG PO TABS
5.0000 mg | ORAL_TABLET | ORAL | Status: DC | PRN
  Administered 2024-12-21 – 2024-12-25 (×9): 10 mg via ORAL
  Filled 2024-12-21 (×9): qty 2

## 2024-12-21 MED ORDER — ALBUMIN HUMAN 5 % IV SOLN: INTRAVENOUS | Status: DC | PRN

## 2024-12-21 MED ORDER — DEXAMETHASONE SOD PHOSPHATE PF 10 MG/ML IJ SOLN
4.0000 mg | INTRAMUSCULAR | Status: AC
  Administered 2024-12-21: 4 mg via INTRAVENOUS

## 2024-12-21 MED ORDER — FENTANYL CITRATE (PF) 50 MCG/ML IJ SOSY
PREFILLED_SYRINGE | INTRAMUSCULAR | Status: AC
  Filled 2024-12-21: qty 3

## 2024-12-21 MED ORDER — ROCURONIUM BROMIDE 10 MG/ML (PF) SYRINGE
PREFILLED_SYRINGE | INTRAVENOUS | Status: AC
  Filled 2024-12-21: qty 10

## 2024-12-21 MED ORDER — DROPERIDOL 2.5 MG/ML IJ SOLN: 0.6250 mg | Freq: Once | INTRAMUSCULAR | Status: DC | PRN

## 2024-12-21 MED ORDER — HEMOSTATIC AGENTS (NO CHARGE) OPTIME
TOPICAL | Status: DC | PRN
  Administered 2024-12-21 (×2): 1

## 2024-12-21 MED ORDER — CHLORHEXIDINE GLUCONATE 0.12 % MT SOLN
15.0000 mL | Freq: Once | OROMUCOSAL | Status: AC
  Administered 2024-12-21: 15 mL via OROMUCOSAL

## 2024-12-21 MED ORDER — SCOPOLAMINE 1 MG/3DAYS TD PT72
1.0000 | MEDICATED_PATCH | TRANSDERMAL | Status: DC
  Administered 2024-12-21: 1 mg via TRANSDERMAL
  Filled 2024-12-21: qty 1

## 2024-12-21 MED ORDER — ONDANSETRON HCL 4 MG PO TABS: 4.0000 mg | ORAL_TABLET | Freq: Four times a day (QID) | ORAL | Status: DC | PRN

## 2024-12-21 MED ORDER — FUROSEMIDE 10 MG/ML IJ SOLN
INTRAMUSCULAR | Status: AC
  Filled 2024-12-21: qty 2

## 2024-12-21 MED ORDER — BUPIVACAINE HCL (PF) 0.25 % IJ SOLN
INTRAMUSCULAR | Status: AC
  Filled 2024-12-21: qty 30

## 2024-12-21 MED ORDER — MIDAZOLAM HCL 5 MG/5ML IJ SOLN
INTRAMUSCULAR | Status: DC | PRN
  Administered 2024-12-21: 2 mg via INTRAVENOUS

## 2024-12-21 MED ORDER — OXYCODONE HCL 5 MG/5ML PO SOLN: 5.0000 mg | Freq: Once | ORAL | Status: DC | PRN

## 2024-12-21 MED ORDER — BUPIVACAINE LIPOSOME 1.3 % IJ SUSP
INTRAMUSCULAR | Status: DC | PRN
  Administered 2024-12-21: 40 mL

## 2024-12-21 MED ORDER — ENOXAPARIN SODIUM 40 MG/0.4ML IJ SOSY
40.0000 mg | PREFILLED_SYRINGE | INTRAMUSCULAR | Status: DC
  Administered 2024-12-22: 40 mg via SUBCUTANEOUS
  Filled 2024-12-21: qty 0.4

## 2024-12-21 MED ORDER — LIDOCAINE HCL (CARDIAC) PF 100 MG/5ML IV SOSY
PREFILLED_SYRINGE | INTRAVENOUS | Status: DC | PRN
  Administered 2024-12-21: 80 mg via INTRAVENOUS

## 2024-12-21 MED ORDER — HYDROMORPHONE HCL 1 MG/ML IJ SOLN
1.0000 mg | INTRAMUSCULAR | Status: DC | PRN
  Administered 2024-12-21: 1 mg via INTRAVENOUS
  Administered 2024-12-22 – 2024-12-25 (×6): 2 mg via INTRAVENOUS
  Filled 2024-12-21: qty 1
  Filled 2024-12-21 (×6): qty 2

## 2024-12-21 MED ORDER — METHOCARBAMOL 500 MG PO TABS
500.0000 mg | ORAL_TABLET | Freq: Four times a day (QID) | ORAL | Status: DC | PRN
  Administered 2024-12-21 – 2024-12-25 (×5): 500 mg via ORAL
  Filled 2024-12-21 (×5): qty 1

## 2024-12-21 MED ORDER — CLINDAMYCIN PHOSPHATE 900 MG/50ML IV SOLN
900.0000 mg | INTRAVENOUS | Status: AC
  Administered 2024-12-21: 900 mg via INTRAVENOUS
  Filled 2024-12-21: qty 50

## 2024-12-21 MED ORDER — ONDANSETRON HCL 4 MG/2ML IJ SOLN
INTRAMUSCULAR | Status: DC | PRN
  Administered 2024-12-21: 4 mg via INTRAVENOUS

## 2024-12-21 MED ORDER — EPHEDRINE SULFATE-NACL 50-0.9 MG/10ML-% IV SOSY
PREFILLED_SYRINGE | INTRAVENOUS | Status: DC | PRN
  Administered 2024-12-21 (×3): 5 mg via INTRAVENOUS

## 2024-12-21 MED ORDER — LABETALOL HCL 5 MG/ML IV SOLN
5.0000 mg | INTRAVENOUS | Status: AC | PRN
  Administered 2024-12-21 (×4): 5 mg via INTRAVENOUS

## 2024-12-21 MED ORDER — ORAL CARE MOUTH RINSE: 15.0000 mL | Freq: Once | OROMUCOSAL | Status: AC

## 2024-12-21 MED ORDER — SUGAMMADEX SODIUM 200 MG/2ML IV SOLN
INTRAVENOUS | Status: DC | PRN
  Administered 2024-12-21: 300 mg via INTRAVENOUS

## 2024-12-21 MED ORDER — KETAMINE HCL 10 MG/ML IJ SOLN
INTRAMUSCULAR | Status: DC | PRN
  Administered 2024-12-21: 10 mg via INTRAVENOUS
  Administered 2024-12-21: 20 mg via INTRAVENOUS
  Administered 2024-12-21: 10 mg via INTRAVENOUS

## 2024-12-21 MED ORDER — ACETAMINOPHEN 10 MG/ML IV SOLN: 1000.0000 mg | Freq: Once | INTRAVENOUS | Status: DC | PRN

## 2024-12-21 MED ORDER — PROPOFOL 10 MG/ML IV BOLUS
INTRAVENOUS | Status: DC | PRN
  Administered 2024-12-21: 180 mg via INTRAVENOUS

## 2024-12-21 MED ORDER — SODIUM CHLORIDE (PF) 0.9 % IJ SOLN
INTRAMUSCULAR | Status: AC
  Filled 2024-12-21: qty 20

## 2024-12-21 NOTE — Op Note (Signed)
 OPERATIVE NOTE  Pre-operative Diagnosis: Complex adnexal mass, stage IV endometriosis  Post-operative Diagnosis: same, intra-abdominal adhesions  Operation: Diagnostic laparoscopy, exploratory laparotomy, lysis of adhesions, infracolic omentectomy, enterolysis, radical dissection for removal of partially retroperitonealized left adnexal mass, total abdominal hysterectomy with bilateral salpingo oophorectomy, cystoscopy, fulguration of endometriosis lesions Extreme morbid obesity requiring additional OR personnel for positioning and retraction. Obesity made retroperitoneal visualization limited and increased the complexity of the case and necessitated additional instrumentation for retraction. Obesity related complexity increased the duration of the procedure by 160 minutes.   Surgeon: Viktoria Crank MD  Assistant Surgeon: Olam Leonce Ada MD and [name], NP (an MD and NP assistants were necessary for tissue manipulation, management of robotic instrumentation, retraction and positioning due to the complexity of the case and hospital policies).   Anesthesia: GET  Urine Output: 150 cc  Operative Findings: On EUA, enlarged, somewhat mobile uterus.  Adnexal mass palpated but difficult to appreciate the size.  On intra-abdominal entry laparoscopically, significant adhesions of the omentum to the anterior abdominal wall noted.  Also hemosiderin deposits within the omentum.  On exploratory laparotomy, omentum adherent at various places along the anterior abdominal wall.  Most of the hemosiderin implants were below the level of the colon.  Several right diaphragmatic implants, measuring up to 2 cm.  Some peritoneal implants all less than 5 mm along the left paracolic gutter and along the right pelvic sidewall.  Normal-appearing small bowel.  Appendix densely adherent to the right tube and ovary necessitating its removal.  Left ovary replaced by an approximately 8-10 cm smooth mass with drainage of  chocolate brown fluid from 1 pocket during its removal.  Mass partially retroperitonealized with left ureter adherent posteriorly and sigmoid mesentery adherent medially.  Quite broad stock connected the left adnexa to the uterus.  The uterus itself was somewhat bulbous measuring approximately 8 cm.  Some adhesions of the bladder peritoneum to the uterine fundus but otherwise bladder free from the lower uterine segment and cervix.  Cervix itself was quite long, measuring over 4 cm.  Take aid with visualization of the pelvis, supracervical hysterectomy was performed and the cervix was removed subsequently.  Given dissection adjacent to the left ureter, cystoscopy was performed at the end of surgery.  Bladder dome was noted to be intact.. Efflux was seen x1 of the left ureter but after approximately 20 minutes watching the right ureter, no outflow had still been seen.  Patient was given a bolus of fluid as well as 10 mg of Lasix .  At this point, cystoscopy tower was called for and urology asked for assistance.  Upon my cystoscopy with the tower, efflux was noted from bilateral ureters.  Frozen section, left ovary consistent with an endometrioma.  Grossly, no lesions noted within the endometrium.  Estimated Blood Loss:  300 cc      Total IV Fluids: see I&O flowsheet         Specimens: uterus, cervix, bilateral tubes and ovaries, omentum         Complications:  None apparent; patient tolerated the procedure well.         Disposition: PACU - hemodynamically stable.  Procedure Details  The patient was seen in the Holding Room. The risks, benefits, complications, treatment options, and expected outcomes were discussed with the patient.  The patient concurred with the proposed plan, giving informed consent.  The site of surgery properly noted/marked. The patient was identified as Kristin Boyd and the procedure verified as a Robotic-assisted vs  open hysterectomy with bilateral salpingo oophorectomy.    After induction of anesthesia, the patient was draped and prepped in the usual sterile manner. Patient was placed in supine position after anesthesia and draped and prepped in the usual sterile manner as follows: Her arms were tucked to her side with all appropriate precautions.  The patient was secured to the bed using padding and tape across her chest.  The patient was placed in the semi-lithotomy position in [place] stirrups.  The perineum and vagina were prepped with CHG. The patient's abdomen was prepped with ChloraPrep and then she was draped after the prep had been allowed to dry for 3 minutes.  A Time Out was held and the above information confirmed.  The urethra was prepped with Betadine . Foley catheter was placed.  A sponge stick was placed in the vagina.  A pneum occluder balloon was placed over the manipulator.  OG tube placement was confirmed and to suction.   Next, a 5 mm skin incision was made 1 cm below the subcostal margin in the midclavicular line.  The 5 mm Optiview port and scope was used for direct entry.  Opening pressure was under 10 mm CO2.  The abdomen was insufflated and the findings were noted as above.     Given intra-abdominal findings, decision made to proceed with exploratory laparotomy.  Midline incision was made with a scalpel and carried down through the subcutaneous tissue to the fascia with monopolar electrocautery.  This was done with the abdomen still insufflated.  The fascia was then scored with monopolar electrocautery and muscle separated from the midline.  Peritoneum was entered bluntly and the peritoneal incision was opened along the length of the incision.  Attention was first turned to the omentum.  Combination of monopolar electrocautery and bipolar electrocautery with the LigaSure device were used to lyse adhesions of the omentum to the anterior abdominal wall, freeing the infracolic omentum.  An infracolic omentectomy was then performed using accommodation  of monopolar and bipolar electrocautery, dissecting the omentum free from the underlying transverse colon and opening the lesser sac.  Once the omentum had been removed, it was handed off the field.  The upper abdomen was surveyed with findings noted above.  The several larger hemosiderin versus endometriosis implants along the right diaphragm were fulgurated with monopolar electrocautery.  Attention was then turned to the pelvis.  Bookwalter was set up.  Oh extra-large Alexis retractor was placed and using the Bookwalter, the small bowel was packed into the upper abdomen.  Pelvis was surveyed with findings noted above.  Attention was first turned to the left.  The sigmoid colon was mobilized from what appeared to be the left infundibulopelvic ligament.  Along the medial portion of the mass, combination of blunt dissection and short bursts of monopolar electrocautery were used to mobilize the sigmoid mesentery from the mass itself.  Along the lateral aspect, the peritoneum was entered and the retroperitoneum developed.  The external iliac vessels were identified and the ureter was palpated.  The infundibulopelvic ligament was then skeletonized, cauterized with the LigaSure device, and transected.  With upward traction on the mass, combination of blunt dissection and monopolar electrocautery was used to slowly free retroperitoneal adhesions to the mass.  The ureter was identified during this process and came in close proximity to posterior aspect of the mass but ultimately mass was able to be freed from the underlying ureter without damage to the ureter.  Inferiorly, adhesions of the mass to the posterior uterus  were lysed with monopolar electrocautery.  Sigmoid epiploica were densely adherent to the posterior uterus and medial aspect of the left adnexal mass.  Combination of monopolar electrocautery and bipolar electrocautery were used to lyse these adhesions, with visualization of the sigmoid colon during this  entire time.  Once the colon was completely mobilized, the very engorged utero-ovarian pedicle was sequentially cauterized using the LigaSure device and transected until the left adnexa was freed.  Left adnexa was then handed off the field to be sent for frozen section.  Attention was then turned to the right.  The cecum was mobilized from the pelvic brim.  The right retroperitoneum was open and the incision taken parallel to the infundibulopelvic ligament to open up the right retroperitoneal space.  The ureter was identified along the medial leaf of the broad ligament.  At this point, it became clear that the appendix was quite adherent to the right tube and ovary.  Decision was made to take the appendix en bloc with the right adnexa.  The appendix was skeletonized and the LigaSure device was used to cauterize and transect the appendiceal artery.  Once the appendix had been skeletonized to its base, it GIA stapler was used to staple and transect the appendix at its base.  The appendix was then oversewn using a pursestring suture of 2-0 Vicryl.  The infundibular ligament on the right was then isolated, cauterized with the LigaSure device and transected.  Posterior peritoneum was taken down to just below the uterus.  Given the very deep pelvis and difficulty with visualizing due to limited space, decision was made to proceed with supracervical hysterectomy and then evaluate for possible removal of the cervix.  Anteriorly, bladder was mobilized from the uterine fundus using monopolar electrocautery.  Bladder flap was developed down to the level of the superior cervix.  The cervix was then grasped with Allises.  The cervix was quite narrow but noted to be over 4 cm long.  Decision made to proceed with removal of the cervix.  Bladder flap was further developed to draw the bladder to the level of the cervicovaginal junction.  The posterior peritoneum was also taken down to below the level of the cervix.  Additional  bites were then taken along the cervix and cardinal ligaments with bipolar electrocautery using the LigaSure device, staying base into the cervix.  Once the cervicovaginal junction had been reached, curved clamps were placed below the cervix and the cervix was amputated and handed off the field.  The colpotomy was closed using the [employer] technique with 0 PDS.  Midportion of the cuff required 2 figure-of-eight sutures using 0 Vicryl for complete closure.  Pelvis was then irrigated copiously with no significant bleeding noted.  Given raw surfaces along the left retroperitoneum, Floseal was placed and pressure held.  Surgicel powder was placed along bilateral sidewalls and the pelvis gated again with good hemostasis noted.  Pelvis was unpacked and all laps were removed.  At this point attention was turned to cystoscopy.  This was done at first with backfilling the bladder with 200 cc of sterile fluid.  Given no jet visualized from the right ureter, urology was called.  During this time, cystoscopy tower was obtained and cystoscopy was performed using cystoscope.  During this second cystoscopy, jets were seen from bilateral ureteral orifices.  Foley catheter was then replaced.  Surgeons gloves and gown were changed.  Attention was then turned back to the abdomen.  Fascial incision was then closed with running looped  0 PDS tied at the midline.  Subcutaneous tissue was irrigated and hemostasis achieved.  Exparel  was then injected for local anesthesia.  Deep subcutaneous tissue was reapproximated using 2-0 Vicryl to close some of the dead space.  Skin was then closed with staples and half-inch packing was placed in between every several staples.  4-0 Monocryl was used to close the diagnostic lap site.  Dermabond was then applied to the skin.  The vagina was swabbed with minimal bleeding noted. All sponge, lap and needle counts were correct x  3.   The patient was transferred to the recovery room in stable  condition.  Comer Dollar, MD

## 2024-12-21 NOTE — Progress Notes (Signed)
 Tried to call Veva Phlegm to give an update after surgery. No answer, no way to leave a message.  Comer Dollar, MD

## 2024-12-21 NOTE — Plan of Care (Signed)
   Problem: Health Behavior/Discharge Planning: Goal: Ability to manage health-related needs will improve Outcome: Progressing   Problem: Clinical Measurements: Goal: Ability to maintain clinical measurements within normal limits will improve Outcome: Progressing

## 2024-12-21 NOTE — Brief Op Note (Signed)
 12/21/2024  9:15 AM  2:45 PM  PATIENT:  Kristin Boyd  44 y.o. female  PRE-OPERATIVE DIAGNOSIS:  Endometriosis  POST-OPERATIVE DIAGNOSIS:  Avanced Endometriosis, chronic pain  PROCEDURE:  Procedures with comments: LAPAROSCOPY, DIAGNOSTIC (N/A) HYSTERECTOMY, TOTAL, ROBOT-ASSISTED, LAPAROSCOPIC, WITH BILATERAL SALPINGO-OOPHORECTOMY, APPENDECTOMY, CYSTOSTOMY, LYSIS OF ADHESIONS (N/A) - UNILATERAL VS BILATERAL OOPHERECTOMY  SURGEON:  Surgeons and Role:    DEWAINE Viktoria Comer JONELLE, MD - Primary    * Rogelio Planas, MD - Assisting  ASSISTANTS: [name], NP   ANESTHESIA:   epidural  EBL:  300 mL   BLOOD ADMINISTERED:none  DRAINS: none   LOCAL MEDICATIONS USED:  Exparel   SPECIMEN:  uterus, cervix, bilateral tubes and ovaries, appendix, omentum  DISPOSITION OF SPECIMEN:  PATHOLOGY  COUNTS:  YES  TOURNIQUET:  * No tourniquets in log *  DICTATION: .Note written in EPIC  PLAN OF CARE: Admit to inpatient   PATIENT DISPOSITION:  PACU - hemodynamically stable.   Delay start of Pharmacological VTE agent (>24hrs) due to surgical blood loss or risk of bleeding: no

## 2024-12-21 NOTE — Transfer of Care (Signed)
 Immediate Anesthesia Transfer of Care Note  Patient: Kristin Boyd  Procedure(s) Performed: LAPAROSCOPY, DIAGNOSTIC HYSTERECTOMY, TOTAL, ROBOT-ASSISTED, LAPAROSCOPIC, WITH BILATERAL SALPINGO-OOPHORECTOMY, APPENDECTOMY, CYSTOSTOMY, LYSIS OF ADHESIONS  Patient Location: PACU  Anesthesia Type:General  Level of Consciousness: drowsy  Airway & Oxygen  Therapy: Patient Spontanous Breathing and Patient connected to face mask oxygen   Post-op Assessment: Report given to RN and Post -op Vital signs reviewed and stable  Post vital signs: Reviewed and stable  Last Vitals:  Vitals Value Taken Time  BP 152/108 12/21/24 15:02  Temp 97.5   Pulse 94 12/21/24 15:06  Resp 24 12/21/24 15:06  SpO2 95 % 12/21/24 15:06  Vitals shown include unfiled device data.  Last Pain:  Vitals:   12/21/24 0830  TempSrc:   PainSc: 0-No pain      Patients Stated Pain Goal: 5 (12/21/24 0816)  Complications: No notable events documented.

## 2024-12-21 NOTE — Anesthesia Procedure Notes (Signed)
 Procedure Name: Intubation Date/Time: 12/21/2024 10:16 AM  Performed by: Gladis Honey, CRNAPre-anesthesia Checklist: Patient identified, Emergency Drugs available, Suction available and Patient being monitored Patient Re-evaluated:Patient Re-evaluated prior to induction Oxygen  Delivery Method: Circle System Utilized Preoxygenation: Pre-oxygenation with 100% oxygen  Induction Type: IV induction Ventilation: Mask ventilation without difficulty Laryngoscope Size: Miller and 2 Grade View: Grade I Tube type: Oral Tube size: 7.5 mm Number of attempts: 1 Airway Equipment and Method: Stylet and Oral airway Placement Confirmation: ETT inserted through vocal cords under direct vision, positive ETCO2 and breath sounds checked- equal and bilateral Secured at: 21 cm Tube secured with: Tape Dental Injury: Teeth and Oropharynx as per pre-operative assessment

## 2024-12-21 NOTE — Interval H&P Note (Signed)
 History and Physical Interval Note:  12/21/2024 7:40 AM  Kristin Boyd  has presented today for surgery, with the diagnosis of Endometriosis.  The various methods of treatment have been discussed with the patient and family. After consideration of risks, benefits and other options for treatment, the patient has consented to  Procedures with comments: LAPAROSCOPY, DIAGNOSTIC (N/A) HYSTERECTOMY, TOTAL, ROBOT-ASSISTED, LAPAROSCOPIC, WITH BILATERAL SALPINGO-OOPHORECTOMY (N/A) - UNILATERAL VS BILATERAL OOPHERECTOMY CREATION, ILEOSTOMY (N/A) COLECTOMY, SIGMOID, OPEN (N/A) as a surgical intervention.  The patient's history has been reviewed, patient examined, no change in status, stable for surgery.  I have reviewed the patient's chart and labs.  Questions were answered to the patient's satisfaction.     Comer JONELLE Dollar

## 2024-12-22 ENCOUNTER — Encounter (HOSPITAL_COMMUNITY): Payer: Self-pay | Admitting: Gynecologic Oncology

## 2024-12-22 LAB — CBC
HCT: 39.2 % (ref 36.0–46.0)
Hemoglobin: 12.5 g/dL (ref 12.0–15.0)
MCH: 26.8 pg (ref 26.0–34.0)
MCHC: 31.9 g/dL (ref 30.0–36.0)
MCV: 84.1 fL (ref 80.0–100.0)
Platelets: 324 K/uL (ref 150–400)
RBC: 4.66 MIL/uL (ref 3.87–5.11)
RDW: 15 % (ref 11.5–15.5)
WBC: 15.3 K/uL — ABNORMAL HIGH (ref 4.0–10.5)
nRBC: 0 % (ref 0.0–0.2)

## 2024-12-22 LAB — BASIC METABOLIC PANEL WITH GFR
Anion gap: 11 (ref 5–15)
Anion gap: 11 (ref 5–15)
BUN: 16 mg/dL (ref 6–20)
BUN: 20 mg/dL (ref 6–20)
CO2: 21 mmol/L — ABNORMAL LOW (ref 22–32)
CO2: 21 mmol/L — ABNORMAL LOW (ref 22–32)
Calcium: 8.5 mg/dL — ABNORMAL LOW (ref 8.9–10.3)
Calcium: 8.3 mg/dL — ABNORMAL LOW (ref 8.9–10.3)
Chloride: 105 mmol/L (ref 98–111)
Chloride: 105 mmol/L (ref 98–111)
Creatinine, Ser: 1.8 mg/dL — ABNORMAL HIGH (ref 0.44–1.00)
Creatinine, Ser: 1.89 mg/dL — ABNORMAL HIGH (ref 0.44–1.00)
GFR, Estimated: 35 mL/min — ABNORMAL LOW
GFR, Estimated: 33 mL/min — ABNORMAL LOW
Glucose, Bld: 102 mg/dL — ABNORMAL HIGH (ref 70–99)
Glucose, Bld: 115 mg/dL — ABNORMAL HIGH (ref 70–99)
Potassium: 5.2 mmol/L — ABNORMAL HIGH (ref 3.5–5.1)
Potassium: 4.7 mmol/L (ref 3.5–5.1)
Sodium: 137 mmol/L (ref 135–145)
Sodium: 137 mmol/L (ref 135–145)

## 2024-12-22 MED ORDER — SODIUM CHLORIDE 0.45 % IV SOLN: INTRAVENOUS | Status: DC

## 2024-12-22 NOTE — Anesthesia Postprocedure Evaluation (Signed)
"   Anesthesia Post Note  Patient: Kristin Boyd  Procedure(s) Performed: LAPAROSCOPY, DIAGNOSTIC HYSTERECTOMY, TOTAL, ROBOT-ASSISTED, LAPAROSCOPIC, WITH BILATERAL SALPINGO-OOPHORECTOMY, APPENDECTOMY, CYSTOSTOMY, LYSIS OF ADHESIONS     Patient location during evaluation: PACU Anesthesia Type: General Level of consciousness: awake and alert Pain management: pain level controlled Vital Signs Assessment: post-procedure vital signs reviewed and stable Respiratory status: spontaneous breathing, nonlabored ventilation, respiratory function stable and patient connected to nasal cannula oxygen  Cardiovascular status: blood pressure returned to baseline and stable Postop Assessment: no apparent nausea or vomiting Anesthetic complications: no   No notable events documented.        [name]      "

## 2024-12-22 NOTE — Addendum Note (Signed)
 Addendum  created 12/22/24 0938 by Tilford Franky BIRCH, MD   Attestation recorded in Intraprocedure, Intraprocedure Attestations filed

## 2024-12-22 NOTE — Progress Notes (Signed)
" °   12/22/24 1115  TOC Brief Assessment  Insurance and Status Reviewed  Patient has primary care physician Yes  Home environment has been reviewed resides in an apartment  Prior level of function: Independent  Prior/Current Home Services No current home services  Social Drivers of Health Review SDOH reviewed no interventions necessary  Readmission risk has been reviewed Yes  Transition of care needs no transition of care needs at this time    "

## 2024-12-22 NOTE — Progress Notes (Addendum)
 1 Day Post-Op Procedures (LRB): LAPAROSCOPY, DIAGNOSTIC (N/A) LAPAROSCOPIC DIAGNOSTIC, OPEN TOTAL HYSTERECTOMY WITH BILATERAL SALPINGO-OOPHORECTOMY, APPENDECTOMY, CYSTOSTOMY, LYSIS OF ADHESIONS (N/A)  Subjective: Patient reports doing well. Having abdominal soreness, different pain from what she experienced preop. Improvement in the pain present preop. Denies chest pain, dyspnea. No nausea or emesis. Tolerated fruit last pm. No flatus or BM.   Objective: Vital signs in last 24 hours: Temp:  [97.7 F (36.5 C)-99.1 F (37.3 C)] 97.9 F (36.6 C) (01/15 1021) Pulse Rate:  [80-101] 80 (01/15 1021) Resp:  [15-30] 16 (01/15 1021) BP: (126-184)/(74-109) 128/74 (01/15 1021) SpO2:  [91 %-99 %] 92 % (01/15 1021) Last BM Date : 12/21/24  Intake/Output from previous day: 01/14 0701 - 01/15 0700 In: 5627.8 [P.O.:560; I.V.:4555.1; IV Piggyback:512.8] Out: 1800 [Urine:1500; Blood:300]  Physical Examination: General: alert, cooperative, and no distress Resp: clear to auscultation bilaterally Cardio: regular rate and rhythm, S1, S2 normal, no murmur, click, rub or gallop GI: incision: abdominal incision with staples and wound wicks in place underneath, dry dressing over incision is intact. Left upper quadrant lap site incision is intact with dermabond present and abdomen is soft, appropriately tender, non-distended, hypoactive bowel sounds Extremities: extremities normal, atraumatic, no cyanosis or edema  Labs: WBC/Hgb/Hct/Plts:  15.3/12.5/39.2/324 (01/15 0518) BUN/Cr/glu/ALT/AST/amyl/lip:  16/1.80/--/--/--/--/-- (01/15 0518)  Assessment: 44 y.o. s/p Procedures: LAPAROSCOPY, DIAGNOSTIC LAPAROSCOPIC DIAGNOSTIC, OPEN TOTAL HYSTERECTOMY WITH BILATERAL SALPINGO-OOPHORECTOMY, APPENDECTOMY, CYSTOSTOMY, LYSIS OF ADHESIONS: stable Pain:  Pain is well-controlled on PRN medications.  Heme: Hgb 12.5 and Hct 39.2 this am. Continue to monitor. Appropriate given preop values, EBL  ID: WBC 15.3 this am.  Given intra-op antibiotics and decadron . No evidence of infection at this time.   CV: BP and HR overall stable. Continue to monitor with ordered vital signs.  GI:  Tolerating po: yes. Antiemetics ordered as needed.  GU: Adequate urine output. Creatinine 1.80 this am. Given bowel prep preop, suspect patient being dehydrated.     FEN: No critical values on am labs.  Prophylaxis: SCDs and lovenox  ordered.  Plan: Continue with IVF. Diet as tolerated. Foley to be removed. Continue strict intake and output. Increase mobility with assist. Repeat am labs.  Continue with current plan of care.    LOS: 1 day    Magdelena Kinsella D Mirko Tailor 12/22/2024, 10:44 AM

## 2024-12-23 LAB — CBC
HCT: 35.1 % — ABNORMAL LOW (ref 36.0–46.0)
Hemoglobin: 11.1 g/dL — ABNORMAL LOW (ref 12.0–15.0)
MCH: 26.6 pg (ref 26.0–34.0)
MCHC: 31.6 g/dL (ref 30.0–36.0)
MCV: 84 fL (ref 80.0–100.0)
Platelets: 232 K/uL (ref 150–400)
RBC: 4.18 MIL/uL (ref 3.87–5.11)
RDW: 15.1 % (ref 11.5–15.5)
WBC: 12.5 K/uL — ABNORMAL HIGH (ref 4.0–10.5)
nRBC: 0 % (ref 0.0–0.2)

## 2024-12-23 LAB — BASIC METABOLIC PANEL WITH GFR
Anion gap: 9 (ref 5–15)
BUN: 13 mg/dL (ref 6–20)
CO2: 23 mmol/L (ref 22–32)
Calcium: 8.4 mg/dL — ABNORMAL LOW (ref 8.9–10.3)
Chloride: 107 mmol/L (ref 98–111)
Creatinine, Ser: 1.03 mg/dL — ABNORMAL HIGH (ref 0.44–1.00)
GFR, Estimated: >60 mL/min
Glucose, Bld: 95 mg/dL (ref 70–99)
Potassium: 4.3 mmol/L (ref 3.5–5.1)
Sodium: 139 mmol/L (ref 135–145)

## 2024-12-23 LAB — SURGICAL PATHOLOGY

## 2024-12-23 MED ORDER — ENOXAPARIN SODIUM 80 MG/0.8ML IJ SOSY
80.0000 mg | PREFILLED_SYRINGE | INTRAMUSCULAR | Status: DC
  Administered 2024-12-23 – 2024-12-25 (×3): 80 mg via SUBCUTANEOUS
  Filled 2024-12-23 (×3): qty 0.8

## 2024-12-23 NOTE — Plan of Care (Signed)

## 2024-12-23 NOTE — Discharge Instructions (Signed)
 AFTER SURGERY INSTRUCTIONS   Return to work: 4-6 weeks if applicable   It can be normal to have drainage from your incision. Plan on keeping a clean, dry dressing over this. You can change it as many times as needed throughout the day. Please call for redness around the incision, increased tenderness, pus-like drainage.   Activity: 1. Be up and out of the bed during the day.  Take a nap if needed.  You may walk up steps but be careful and use the hand rail.  Stair climbing will tire you more than you think, you may need to stop part way and rest.    2. No lifting or straining for 6 weeks over 10 pounds. No pushing, pulling, straining for 6 weeks.   3. No driving for 4-89 days when the following criteria have been met: Do not drive if you are taking narcotic pain medicine and make sure that your reaction time has returned.    4. You can shower as soon as the next day after surgery. Shower daily.  Use your regular soap and water  (not directly on the incision) and pat your incision(s) dry afterwards; don't rub.  No tub baths or submerging your body in water  until cleared by your surgeon. If you have the soap that was given to you by pre-surgical testing that was used before surgery, you do not need to use it afterwards because this can irritate your incisions.    5. No sexual activity and nothing in the vagina for 12 weeks.   6. You may experience a small amount of clear drainage from your incisions, which is normal.  If the drainage persists, increases, or changes color please call the office.   7. Do not use creams, lotions, or ointments such as neosporin on your incisions after surgery until advised by your surgeon because they can cause removal of the dermabond glue on your incisions.     8. You may experience vaginal spotting after surgery or when the stitches at the top of the vagina begin to dissolve.  The spotting is normal but if you experience heavy bleeding, call our office.   9. Take  Tylenol  or ibuprofen  first for pain if you are able to take these medications and only use narcotic pain medication for severe pain not relieved by the Tylenol  or Ibuprofen .  Monitor your Tylenol  intake to a max of 4,000 mg in a 24 hour period. You can alternate these medications after surgery.   Diet: 1. Low sodium Heart Healthy Diet is recommended but you are cleared to resume your normal (before surgery) diet after your procedure.   2. It is safe to use a laxative, such as Miralax  or Colace, if you have difficulty moving your bowels before surgery. You have been prescribed Sennakot-S to take at bedtime every evening after surgery to keep bowel movements regular and to prevent constipation.     Wound Care: 1. Keep clean and dry.  Shower daily.   Reasons to call the Doctor: Fever - Oral temperature greater than 100.4 degrees Fahrenheit Foul-smelling vaginal discharge Difficulty urinating Nausea and vomiting Increased pain at the site of the incision that is unrelieved with pain medicine. Difficulty breathing with or without chest pain New calf pain especially if only on one side Sudden, continuing increased vaginal bleeding with or without clots.   Contacts: For questions or concerns you should contact:   Dr. Comer Dollar at [phone]   [name], NP at [phone]  After Hours: call [phone] and have the GYN Oncologist paged/contacted (after 5 pm or on the weekends). You will speak with an after hours RN and let he or she know you have had surgery.   Messages sent via mychart are for non-urgent matters and are not responded to after hours so for urgent needs, please call the after hours number.

## 2024-12-23 NOTE — Progress Notes (Signed)
 2 Days Post-Op Procedures (LRB): LAPAROSCOPY, DIAGNOSTIC (N/A) LAPAROSCOPIC DIAGNOSTIC, OPEN TOTAL HYSTERECTOMY WITH BILATERAL SALPINGO-OOPHORECTOMY, APPENDECTOMY, CYSTOSTOMY, LYSIS OF ADHESIONS (N/A)  Subjective: Patient reports doing well. Continues to have abdominal soreness. Denies chest pain, dyspnea. No nausea or emesis. Tolerated diet. No flatus or BM. Ambulated in the hall once yesterday. Voiding since foley removal without difficulty.    Objective: Vital signs in last 24 hours: Temp:  [97.9 F (36.6 C)-99.1 F (37.3 C)] 99 F (37.2 C) (01/16 9361) Pulse Rate:  [80-95] 84 (01/16 0638) Resp:  [16-18] 18 (01/16 9361) BP: (128-157)/(72-89) 157/89 (01/16 9361) SpO2:  [91 %-98 %] 98 % (01/16 9361) Last BM Date : 12/21/24  Intake/Output from previous day: 01/15 0701 - 01/16 0700 In: 3602.9 [P.O.:1410; I.V.:2192.9] Out: 2100 [Urine:2100]  Physical Examination: General: alert, cooperative, and no distress Resp: clear to auscultation bilaterally Cardio: regular rate and rhythm, S1, S2 normal, no murmur, click, rub or gallop GI: incision: abdominal incision with staples and wound wicks in place underneath, dry, lightly stained dressing over incision is intact. Left upper quadrant lap site incision is intact with dermabond present and abdomen is soft, appropriately tender, non-distended, mildly hypoactive bowel sounds Extremities: extremities normal, atraumatic, no cyanosis or edema  Labs: WBC/Hgb/Hct/Plts:  12.5/11.1/35.1/232 (01/16 0519) BUN/Cr/glu/ALT/AST/amyl/lip:  13/1.03/--/--/--/--/-- (01/16 9480)  Assessment: 44 y.o. s/p Procedures: LAPAROSCOPY, DIAGNOSTIC LAPAROSCOPIC DIAGNOSTIC, OPEN TOTAL HYSTERECTOMY WITH BILATERAL SALPINGO-OOPHORECTOMY, APPENDECTOMY, CYSTOSTOMY, LYSIS OF ADHESIONS: stable Pain:  Pain is well-controlled on PRN medications.  Heme: Hgb 11.1 and Hct 35.1 this am. Continue to monitor. Appropriate given preop values, EBL  ID: WBC 12.5 this am. Given  intra-op antibiotics and decadron . No evidence of infection at this time.   CV: BP and HR overall stable. Continue to monitor with ordered vital signs.  GI:  Tolerating po: yes. Antiemetics ordered as needed.  GU: Adequate urine output. Creatinine improved to 1.03 from 1.80. Given bowel prep preop, suspect patient being dry intra-op and immediately post-op-improvement after IVF hydration.     FEN: No critical values on am labs.  Prophylaxis: SCDs and lovenox  ordered.  Plan: Saline lock IV Diet as tolerated Continue strict intake and output Increase mobility with assist. Encouraged patient to walk 3-4 times in the hall. Continue with current plan of care. If patient passes flatus, will plan for discharge home. The abdominal incision wicks will be removed from the incision at the time of discharge.    LOS: 2 days    Kristin Boyd 12/23/2024, 8:48 AM

## 2024-12-23 NOTE — Progress Notes (Signed)
 Mobility Specialist Progress Note:   12/23/24 1533  Mobility  Activity Ambulated with assistance  Level of Assistance Minimal assist, patient does 75% or more  Assistive Device Front wheel walker  Distance Ambulated (ft) 125 ft  Activity Response Tolerated fair  Mobility Referral Yes  Mobility visit 1 Mobility  Mobility Specialist Start Time (ACUTE ONLY) 1426  Mobility Specialist Stop Time (ACUTE ONLY) 1438  Mobility Specialist Time Calculation (min) (ACUTE ONLY) 12 min   Pt was received in recliner and agreed to mobility. Min A sit to stand. Limited ambulation due to abdominal pain. Returned to bed with all needs met. Call bell in reach.  [employer] - Mobility Specialist

## 2024-12-23 NOTE — Progress Notes (Signed)
 GYN Oncology Progress Note  Patient is alert, oriented, currently in the restroom.  She denies passing flatus or having a bowel movement.  She has ambulated in the hall once today.  Awaiting return of bowel function.  Continue current plan of care.

## 2024-12-24 ENCOUNTER — Ambulatory Visit: Payer: Self-pay | Admitting: Gynecologic Oncology

## 2024-12-24 LAB — BASIC METABOLIC PANEL WITH GFR
Anion gap: 10 (ref 5–15)
BUN: 8 mg/dL (ref 6–20)
CO2: 24 mmol/L (ref 22–32)
Calcium: 9.1 mg/dL (ref 8.9–10.3)
Chloride: 108 mmol/L (ref 98–111)
Creatinine, Ser: 0.76 mg/dL (ref 0.44–1.00)
GFR, Estimated: >60 mL/min
Glucose, Bld: 90 mg/dL (ref 70–99)
Potassium: 4.2 mmol/L (ref 3.5–5.1)
Sodium: 142 mmol/L (ref 135–145)

## 2024-12-24 LAB — CBC
HCT: 34.5 % — ABNORMAL LOW (ref 36.0–46.0)
Hemoglobin: 11 g/dL — ABNORMAL LOW (ref 12.0–15.0)
MCH: 26.6 pg (ref 26.0–34.0)
MCHC: 31.9 g/dL (ref 30.0–36.0)
MCV: 83.5 fL (ref 80.0–100.0)
Platelets: 229 K/uL (ref 150–400)
RBC: 4.13 MIL/uL (ref 3.87–5.11)
RDW: 15.1 % (ref 11.5–15.5)
WBC: 10.6 K/uL — ABNORMAL HIGH (ref 4.0–10.5)
nRBC: 0 % (ref 0.0–0.2)

## 2024-12-24 MED ORDER — BISACODYL 10 MG RE SUPP: 10.0000 mg | Freq: Every day | RECTAL | Status: DC | PRN

## 2024-12-24 NOTE — Progress Notes (Signed)
 Mobility Specialist Progress Note:   12/24/24 1615  Mobility  Activity Ambulated with assistance  Level of Assistance Minimal assist, patient does 75% or more  Assistive Device Front wheel walker  Distance Ambulated (ft) 120 ft  Activity Response Tolerated poorly  Mobility Referral Yes  Mobility visit 1 Mobility  Mobility Specialist Start Time (ACUTE ONLY) 1540  Mobility Specialist Stop Time (ACUTE ONLY) 1550  Mobility Specialist Time Calculation (min) (ACUTE ONLY) 10 min   Pt was received in recliner and agreed to mobility. Min A sit to stand. Limited ambulation due to abdominal pain. Returned to bed with all needs met and call bell in reach. RN notified regarding pain.  [employer] - Mobility Specialist

## 2024-12-24 NOTE — Progress Notes (Signed)
 3 Days Post-Op Procedures (LRB): LAPAROSCOPY, DIAGNOSTIC (N/A) LAPAROSCOPIC DIAGNOSTIC, OPEN TOTAL HYSTERECTOMY WITH BILATERAL SALPINGO-OOPHORECTOMY, APPENDECTOMY, CYSTOSTOMY, LYSIS OF ADHESIONS (N/A)  Subjective: Patient reports doing well.  Tolerating diet. No flatus or BM. Ambulating currently  Objective: Vital signs in last 24 hours: Temp:  [98.1 F (36.7 C)-99 F (37.2 C)] 99 F (37.2 C) (01/17 0828) Pulse Rate:  [82-87] 82 (01/17 0828) Resp:  [16-18] 18 (01/17 0828) BP: (125-169)/(67-113) 169/92 (01/17 0828) SpO2:  [98 %-99 %] 98 % (01/17 0828) Last BM Date : 12/21/24  Intake/Output from previous day: 01/16 0701 - 01/17 0700 In: 1680 [P.O.:1680] Out: 1300 [Urine:1300]  Physical Examination: General: no distress Ambulating  Labs: WBC/Hgb/Hct/Plts:  10.6/11.0/34.5/229 (01/17 0454) BUN/Cr/glu/ALT/AST/amyl/lip:  8/0.76/--/--/--/--/-- (01/17 0454)  Assessment: 44 y.o. s/p Procedures: LAPAROSCOPY, DIAGNOSTIC LAPAROSCOPIC DIAGNOSTIC, OPEN TOTAL HYSTERECTOMY WITH BILATERAL SALPINGO-OOPHORECTOMY, APPENDECTOMY, CYSTOSTOMY, LYSIS OF ADHESIONS: stable Pain:  Pain is well-controlled on PRN medications.  ID: WBC downward trending. No evidence of infection at this time.   GI:  Tolerating po: yes. Awaiting return of bowel function.    FEN: No critical values on am labs.  Prophylaxis: SCDs and lovenox  ordered.  Plan: Dulcolax supp Continue diet as tolerated Increase fluid intake Continue OOB   LOS: 3 days    Olam Mill 12/24/2024, 10:53 AM

## 2024-12-25 LAB — BASIC METABOLIC PANEL WITH GFR
Anion gap: 11 (ref 5–15)
BUN: 11 mg/dL (ref 6–20)
CO2: 25 mmol/L (ref 22–32)
Calcium: 9.5 mg/dL (ref 8.9–10.3)
Chloride: 104 mmol/L (ref 98–111)
Creatinine, Ser: 0.77 mg/dL (ref 0.44–1.00)
GFR, Estimated: >60 mL/min
Glucose, Bld: 92 mg/dL (ref 70–99)
Potassium: 3.8 mmol/L (ref 3.5–5.1)
Sodium: 140 mmol/L (ref 135–145)

## 2024-12-25 LAB — CBC
HCT: 34.6 % — ABNORMAL LOW (ref 36.0–46.0)
Hemoglobin: 11.3 g/dL — ABNORMAL LOW (ref 12.0–15.0)
MCH: 27.3 pg (ref 26.0–34.0)
MCHC: 32.7 g/dL (ref 30.0–36.0)
MCV: 83.6 fL (ref 80.0–100.0)
Platelets: 269 K/uL (ref 150–400)
RBC: 4.14 MIL/uL (ref 3.87–5.11)
RDW: 14.9 % (ref 11.5–15.5)
WBC: 9.6 K/uL (ref 4.0–10.5)
nRBC: 0 % (ref 0.0–0.2)

## 2024-12-25 MED ORDER — NORETHINDRONE ACETATE 5 MG PO TABS: 5.0000 mg | ORAL_TABLET | Freq: Every day | ORAL | 2 refills | Status: AC

## 2024-12-25 NOTE — Discharge Summary (Signed)
 Physician Discharge Summary  Patient ID: Kristin Boyd MRN: [id number] DOB/AGE: 45/03/1981 45 y.o.  Admit date: 12/21/2024 Discharge date: 12/25/2024  Admission Diagnoses: Endometriosis  Discharge Diagnoses:  Principal Problem:   Endometriosis Active Problems:   Intra-abdominal adhesions   Discharged Condition: good  Hospital Course: On 12/21/2024, the patient underwent the following: Procedures: LAPAROSCOPY, DIAGNOSTIC LAPAROSCOPIC DIAGNOSTIC, OPEN TOTAL HYSTERECTOMY WITH BILATERAL SALPINGO-OOPHORECTOMY, APPENDECTOMY, CYSTOSTOMY, LYSIS OF ADHESIONS.   The postoperative course was uneventful.  She was discharged to home on postoperative day 4 tolerating a regular diet.   Consults: None  Significant Diagnostic Studies: None  Treatments: surgery: see  above  Discharge Exam: Blood pressure (!) 175/101, pulse 87, temperature 98.1 F (36.7 C), temperature source Oral, resp. rate 19, height 5' 9 (1.753 m), weight (!) 157 kg, last menstrual period 06/07/2024, SpO2 98%. General appearance: alert Resp: clear to auscultation bilaterally Cardio: regular rate and rhythm, S1, S2 normal, no murmur, click, rub or gallop GI: soft, non-tender; bowel sounds normal; no masses,  no organomegaly Extremities: extremities normal, atraumatic, no cyanosis or edema Incision/Wound: packing gauze strips removed--serosanguinous drainage; staples in place  Disposition: Discharge disposition: 01-Home or Self Care       Discharge Instructions     Call MD for:  extreme fatigue   Complete by: As directed    Call MD for:  persistant dizziness or light-headedness   Complete by: As directed    Call MD for:  persistant nausea and vomiting   Complete by: As directed    Call MD for:  redness, tenderness, or signs of infection (pain, swelling, redness, odor or green/yellow discharge around incision site)   Complete by: As directed    Call MD for:  severe uncontrolled pain   Complete by: As  directed    Call MD for:  temperature >100.4   Complete by: As directed    No dressing needed   Complete by: As directed       Allergies as of 12/25/2024       Reactions   Peanut (diagnostic) Anaphylaxis   Penicillins Anaphylaxis   Has patient had a PCN reaction causing immediate rash, facial/tongue/throat swelling, SOB or lightheadedness with hypotension: Yes Has patient had a PCN reaction causing severe rash involving mucus membranes or skin necrosis: Unknown Has patient had a PCN reaction that required hospitalization: Yes Has patient had a PCN reaction occurring within the last 10 years: Yes If all of the above answers are NO, then may proceed with Cephalosporin use.        Medication List     TAKE these medications    acetaminophen  500 MG tablet Commonly known as: TYLENOL  Take 1,000 mg by mouth every 6 (six) hours as needed for mild pain (pain score 1-3) or moderate pain (pain score 4-6).   amLODipine  10 MG tablet Commonly known as: NORVASC  TAKE 1 TABLET(10 MG) BY MOUTH DAILY   bisacodyl  5 MG EC tablet Commonly known as: DULCOLAX Starting at 9:00 am the day before surgery, take 4 Dulcolax tablets with 2 glasses of clear liquid.   buPROPion  150 MG 24 hr tablet Commonly known as: WELLBUTRIN  XL Take 150 mg by mouth daily.   erythromycin  base 500 MG tablet Commonly known as: E-MYCIN  Take two tablets (1000 mg total) at 2 pm, 3 pm, and 10 pm the day before surgery   gabapentin  300 MG capsule Commonly known as: NEURONTIN  Take 300 mg by mouth See admin instructions. 2-3 times daily as needed   lidocaine  5 %  Commonly known as: Lidoderm  Place 1 patch onto the skin daily. Remove & Discard patch within 12 hours or as directed by MD   methocarbamol  500 MG tablet Commonly known as: ROBAXIN  Take 1 tablet (500 mg total) by mouth 2 (two) times daily as needed for muscle spasms. What changed: when to take this   neomycin  500 MG tablet Commonly known as:  MYCIFRADIN  Take two tablets (1000 mg total) at 2 pm, 3 pm, and 10 pm the day before surgery   norethindrone  5 MG tablet Commonly known as: Aygestin  Take 1 tablet (5 mg total) by mouth daily.   oxyCODONE  5 MG immediate release tablet Commonly known as: Oxy IR/ROXICODONE  Take 1 tablet (5 mg total) by mouth every 6 (six) hours as needed for severe pain (pain score 7-10). Do not take and drive. Use caution, may cause drowsiness   polyethylene glycol powder 17 GM/SCOOP powder Commonly known as: MiraLax  Starting at 11 am on the day before surgery, mix whole bottle of Miralax  in 64 oz of Gatorade and drink one 8 oz glass every 15 minutes until gone.   UNABLE TO FIND Gallilrey 5mg  daily in am               Discharge Care Instructions  (From admission, onward)           Start     Ordered   12/25/24 0000  No dressing needed        12/25/24 9062            Follow-up Information     Viktoria Comer SAUNDERS, MD Follow up on 12/28/2024.   Specialty: Gynecologic Oncology Why: Plan for a PHONE visit on 12/28/24 with Dr. Viktoria to check in and discuss pathology. IN PERSON visit will on 01/19/2025 at the [facility]. Contact information: [address] [place] [address] [phone]         [facility] - A Dept Of [place] Follow up on 01/02/2025.   Specialty: Gynecologic Oncology Why: at 11 am at the [facility] for staple removal Contact information: [address] [place]  [address] [phone]                Signed: Olam Mill 12/25/2024, 12:23 PM

## 2024-12-26 ENCOUNTER — Encounter: Payer: Self-pay | Admitting: Gynecologic Oncology

## 2024-12-26 ENCOUNTER — Telehealth: Payer: Self-pay | Admitting: *Deleted

## 2024-12-26 NOTE — Telephone Encounter (Signed)
 Spoke with Kristin Boyd this morning. She states she is eating, drinking and urinating well. She has had a BM and is passing gas. Encouraged her to drink plenty of water  and take over the counter stool softeners as needed especially while taking oxycodone .  She denies fever or chills. Incisions are dry and intact. She rates her pain 8/10. Her pain is controlled with oxycodone . Pt states she hasn't had oxycodone  in over 8 hours. Advised to alternate with tylenol  and ibuprofen . Also try alternating heat and ice on areas of soreness, continue to ambulate and support incision with supportive clothing and using a binder of pillow with movement.    Instructed to call office with any fever, chills, purulent drainage, uncontrolled pain or any other questions or concerns. Patient verbalizes understanding.   Pt aware of post op appointments as well as the office number [phone] and after hours number [phone] to call if she has any questions or concerns

## 2024-12-28 ENCOUNTER — Encounter: Payer: Self-pay | Admitting: Gynecologic Oncology

## 2024-12-28 ENCOUNTER — Inpatient Hospital Stay: Admitting: Gynecologic Oncology

## 2024-12-28 DIAGNOSIS — G8929 Other chronic pain: Secondary | ICD-10-CM

## 2024-12-28 DIAGNOSIS — G588 Other specified mononeuropathies: Secondary | ICD-10-CM

## 2024-12-28 DIAGNOSIS — Z90710 Acquired absence of both cervix and uterus: Secondary | ICD-10-CM

## 2024-12-28 DIAGNOSIS — N809 Endometriosis, unspecified: Secondary | ICD-10-CM

## 2024-12-28 DIAGNOSIS — Z9079 Acquired absence of other genital organ(s): Secondary | ICD-10-CM

## 2024-12-28 DIAGNOSIS — Z90722 Acquired absence of ovaries, bilateral: Secondary | ICD-10-CM

## 2024-12-28 DIAGNOSIS — R200 Anesthesia of skin: Secondary | ICD-10-CM

## 2024-12-28 NOTE — Progress Notes (Addendum)
 Gynecologic Oncology Telehealth Note: Gyn-Onc  I connected with Kristin Boyd on 12/28/24 at  6:00 PM EST by telephone and verified that I am speaking with the correct person using two identifiers.  I discussed the limitations, risks, security and privacy concerns of performing an evaluation and management service by telemedicine and the availability of in-person appointments. I also discussed with the patient that there may be a patient responsible charge related to this service. The patient expressed understanding and agreed to proceed.  Other persons participating in the visit and their role in the encounter: none.  Patient's location: [place] Provider's location: [facility], [place]  Reason for Visit: follow-up  Treatment History:  Patient's history is notable for an endometrial ablation at the age of 78.  She describes her periods as being quite heavy at this time and occurring sometimes multiple times a month.  She then had intermittent pain after the ablation but then in 2019 started to have progressive pain.  She ultimately underwent imaging with CT scan and pelvic ultrasound in August 2020 which showed an 8.3 x 6.6 x 8.9 cm complex cystic and solid left adnexal mass.  Follow-up ultrasound showed a complex and cystic lesion in the left adnexa with a tubular shape measuring 12.6 x 5.6 x 7.4 cm; mass on imaging was felt to perhaps represent a tubo-ovarian abscess.  CA125 was obtained at that time and was mildly elevated at 51.  The plan at that time was for referral to GYN oncology, which did not appear to have happened.     She then had multiple visits in the emergency department with persistence of the mass noted.  Most recently, she had a consultation with Dr. Court at [facility] with a plan to proceed with surgical excision.  Most recent imaging by ultrasound in mid October showed a left adnexal mass measuring 10.3 x 11.4 x 11.2 cm with associated septation and vascularity although  poorly visualized.  Patient was seen in our emergency department on the fifth of this month.  She underwent CT and pelvic ultrasound which show persistent mass without significant change in size since most recent ultrasound.     11/12/2021: Diagnostic surgery for a complex adnexal mass and thickened endometrium.  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   She was on Orilissa  for some time with good relief but unfortunately this stopped being covered by her insurance.  She then tried norethindrone  (didn't provide much relief).    See in the ED on 2/13 with pain.  Pelvic ultrasound at that time showed complex hypoechoic structure in the left ovary measuring 7.7 x 6.9 x 8.6, possible hemorrhagic cyst or endometrioma.  Uterine fibroid also noted.  Mildly thickened endometrium.   Started Lupron  02/26/23. Had significant improvement in her abdominal pain and repeat ultrasound in August 2024 showed decrease size of left ovarian cystic lesion from 8.6 to 6.6 cm.  Seen by OB/GYN around this time and was started on estrogen for add back therapy.   She did not have any ED visit from 01/2023 to 01/2024 for either abdominal or back pain. Last Lupron  was on 06/19/23.  Patient started therapy for back pain in September 2024, did not follow-up for further sessions.   Patient lost insurance.   Since February 2025, she has had 10 emergency department visits for back pain and 8 for abdominal pain.  She has had an additional 3 visits for  vaginal bleeding.   Reestablished care with me in mid September.  Given change in insurance, we discussed restarting Lupron  as she had had good relief with this medication.  We discussed doing this through her OB/GYN's office versus our clinic and adding Aygestin  for add back therapy.  We also discussed her vaginal bleeding, suggestive of anovulatory bleeding.  Given  known cervical stenosis and inability to sample her endometrium several years ago, discussed either attempted endometrial biopsy versus getting an MRI to better evaluate the lining of her uterus.   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  No discrete uterine myometrial lesion seen, no enhancing component within the endometrium.  Right ovary appears normal.  There is a 9.3 x 11.4 cm hyperintense lesion within the left adnexa exhibiting T2 bleeding without normal left parenchyma noted.  Lesion does not exhibit enhancement on postcontrast images, no mural nodularity.  Along the anterior aspect of the lesion is a nonenhancing dilated fallopian tube.   08/2024: NILM pap, HR HPV neg   09/14/2024: Lupron  injection   She had visits to the emergency department on 10/14, 11/3, and 11/5.  No further visits since then.  12/22/23: Diagnostic laparoscopy, exploratory laparotomy, lysis of adhesions, infracolic omentectomy, enterolysis, radical dissection for removal of partially retroperitonealized left adnexal mass, total abdominal hysterectomy with bilateral salpingo oophorectomy, cystoscopy, fulguration of endometriosis lesions   Interval History: Doing well. Each day a little better. Normal bowel function, not taking any bowel regimen. Denies urinary symptoms. Pain up and down. Using Tylenol  or oxycodone . No vaginal bleeding.  Has had some right foot numbness and tingling, not affecting her ability to walk or move her right foot/extremity.  Past Medical/Surgical History: Past Medical History:  Diagnosis Date   Anxiety    Arthritis    Chronic knee pain    Chronic pelvic pain in female    Endometriosis    History of kidney stones    Hypertension    Obese    Ovarian cyst    Pneumonia due to COVID-19 virus 05/26/2019   Postoperative state 11/12/2021    Vaginal Pap smear, abnormal     Past Surgical History:  Procedure Laterality Date   CESAREAN SECTION     x 1   CHOLECYSTECTOMY     Laparoscopic   COLONOSCOPY N/A 12/13/2024   Procedure: COLONOSCOPY;  Surgeon: Suzann Inocente HERO, MD;  Location: [facility];  Service: Gastroenterology;  Laterality: N/A;   DILATION AND CURETTAGE OF UTERUS N/A 11/12/2021   Procedure: ATTEMPTED HYSTEROSCOPY;  Surgeon: Viktoria Comer SAUNDERS, MD;  Location: [facility];  Service: Gynecology;  Laterality: N/A;   ENDOMETRIAL ABLATION     LAPAROSCOPY N/A 12/21/2024   Procedure: LAPAROSCOPY, DIAGNOSTIC;  Surgeon: Viktoria Comer SAUNDERS, MD;  Location: [facility];  Service: Gynecology;  Laterality: N/A;   POLYPECTOMY  12/13/2024   Procedure: POLYPECTOMY, INTESTINE;  Surgeon: Suzann Inocente HERO, MD;  Location: [facility];  Service: Gastroenterology;;   ROBOTIC ASSISTED TOTAL HYSTERECTOMY WITH BILATERAL SALPINGO OOPHERECTOMY N/A 12/21/2024   Procedure: LAPAROSCOPIC DIAGNOSTIC, OPEN TOTAL HYSTERECTOMY WITH BILATERAL SALPINGO-OOPHORECTOMY, APPENDECTOMY, CYSTOSTOMY, LYSIS OF ADHESIONS;  Surgeon: Viktoria Comer SAUNDERS, MD;  Location: [facility];  Service: Gynecology;  Laterality: N/A;   WISDOM TOOTH EXTRACTION     2 removed   XI ROBOTIC ASSISTED SALPINGECTOMY N/A 11/12/2021   Procedure: DIAGNOSTIC LAPAROSCOPY, PERITONEAL BIOPSIES;  Surgeon: Viktoria Comer SAUNDERS, MD;  Location:  [facility];  Service: Gynecology;  Laterality: N/A;    Family History  Problem Relation Age of Onset   Hypertension Father    Diabetes Father    Colon cancer Neg Hx    Breast cancer Neg Hx    Ovarian cancer Neg Hx    Endometrial cancer Neg Hx    Pancreatic cancer Neg Hx    Prostate cancer Neg Hx     Social History   Socioeconomic History   Marital status: Single    Spouse name: Not on file   Number of children: 2   Years of education: Not on file   Highest education level: Not on file  Occupational History   Not on file  Tobacco Use   Smoking status:  Every Day    Types: Cigarettes    Passive exposure: Current   Smokeless tobacco: Never  Vaping Use   Vaping status: Never Used  Substance and Sexual Activity   Alcohol use: Yes    Comment: occasional   Drug use: Never   Sexual activity: Not Currently    Birth control/protection: Surgical  Other Topics Concern   Not on file  Social History Narrative   Not on file   Social Drivers of Health   Tobacco Use: High Risk (12/21/2024)   Patient History    Smoking Tobacco Use: Every Day    Smokeless Tobacco Use: Never    Passive Exposure: Current  Financial Resource Strain: Low Risk (07/06/2024)   Received from [facility]   Overall Financial Resource Strain (CARDIA)    How hard is it for you to pay for the very basics like food, housing, medical care, and heating?: Not very hard  Food Insecurity: No Food Insecurity (12/21/2024)   Epic    Worried About Radiation Protection Practitioner of Food in the Last Year: Never true    Ran Out of Food in the Last Year: Never true  Transportation Needs: No Transportation Needs (12/21/2024)   Epic    Lack of Transportation (Medical): No    Lack of Transportation (Non-Medical): No  Physical Activity: Insufficiently Active (07/06/2024)   Received from [facility]   Exercise Vital Sign    On average, how many days per week do you engage in moderate to strenuous exercise (like a brisk walk)?: 2 days    On average, how many minutes do you engage in exercise at this level?: 10 min  Stress: No Stress Concern Present (07/06/2024)   Received from [facility] - Occupational Stress Questionnaire    Do you feel stress - tense, restless, nervous, or anxious, or unable to sleep at night because your mind is troubled all the time - these days?: Not at all  Social Connections: Socially Integrated (07/06/2024)   Received from [facility]   Social Network    How would you rate your social network (family, work, friends)?: Good  participation with social networks  Depression (PHQ2-9): Low Risk (02/04/2023)   Depression (PHQ2-9)    PHQ-2 Score: 0  Alcohol Screen: Not on file  Housing: Low Risk (12/21/2024)   Epic    Unable to Pay for Housing in the Last Year: No    Number of Times Moved in the Last Year: 0    Homeless in the Last Year: No  Utilities: Not At Risk (12/21/2024)   Epic    Threatened with loss of utilities: No  Health Literacy: Not on file    Current Medications: Current  Medications[1]  Review of Symptoms: Pertinent positives as per HPI.  Physical Exam: Deferred given limitations of phone visit.  Laboratory & Radiologic Studies: A. 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 UTERUS, RIGHT FALLOPIAN TUBE AND OVARY:  - Uterus with leiomyomata, largest measuring []  cm  - Benign inactive endometrium  - Endometriosis of the serosal surface  - Segment of benign fallopian tube with endometriosis  - Benign appendix with endometriosis  - No evidence of malignancy   C. OMENTUM:  - Portion of omentum with endometriosis   D. CERVIX:  - Benign unremarkable cervix   Assessment & Plan: Kristin Boyd is a 44 y.o. woman with Stage IV endometriosis who presents for phone follow-up after definitive surgical management.  Patient is overall doing well, slowly improving.  This is expected in the setting of her known extensive endometriosis and preoperative issues with pain and pain control.  Discussed that if she is continuing to need oxycodone  for more than 2-3 weeks after surgery, we will likely refer to pain clinic as we had discussed preoperatively.  Otherwise, discussed continued expectations and restrictions.  I suspect that her right foot numbness/tingling is related to nerve compression/injury during surgery that will hopefully improve over the next 4-6 weeks.  Luckily, this  is not affecting her ability to walk or move around.  If symptoms persist, we will have her evaluated by physical therapy.  I discussed the assessment and treatment plan with the patient. The patient was provided with an opportunity to ask questions and all were answered. The patient agreed with the plan and demonstrated an understanding of the instructions.   The patient was advised to call back or see an in-person evaluation if the symptoms worsen or if the condition fails to improve as anticipated.   12 minutes of total time was spent for this patient encounter, including preparation, phone counseling with the patient and coordination of care, and documentation of the encounter.   Comer Dollar, MD  Division of Gynecologic Oncology  Department of Obstetrics and Gynecology  [facility]      [1]  Current Outpatient Medications:    acetaminophen  (TYLENOL ) 500 MG tablet, Take 1,000 mg by mouth every 6 (six) hours as needed for mild pain (pain score 1-3) or moderate pain (pain score 4-6)., Disp: , Rfl:    amLODipine  (NORVASC ) 10 MG tablet, TAKE 1 TABLET(10 MG) BY MOUTH DAILY, Disp: 30 tablet, Rfl: 0   bisacodyl  (DULCOLAX) 5 MG EC tablet, Starting at 9:00 am the day before surgery, take 4 Dulcolax tablets with 2 glasses of clear liquid., Disp: 4 tablet, Rfl: 0   buPROPion  (WELLBUTRIN  XL) 150 MG 24 hr tablet, Take 150 mg by mouth daily., Disp: , Rfl:    erythromycin  base (E-MYCIN ) 500 MG tablet, Take two tablets (1000 mg total) at 2 pm, 3 pm, and 10 pm the day before surgery, Disp: 6 tablet, Rfl: 0   gabapentin  (NEURONTIN ) 300 MG capsule, Take 300 mg by mouth See admin instructions. 2-3 times daily as needed, Disp: , Rfl:    lidocaine  (LIDODERM ) 5 %, Place 1 patch onto the skin daily. Remove & Discard patch within 12 hours or as directed by MD, Disp: 10 patch, Rfl: 0   methocarbamol  (ROBAXIN ) 500 MG tablet, Take 1 tablet (500 mg total) by mouth 2 (two) times daily  as  needed for muscle spasms. (Patient taking differently: Take 500 mg by mouth daily as needed for muscle spasms.), Disp: 14 tablet, Rfl: 0   neomycin  (MYCIFRADIN ) 500 MG tablet, Take two tablets (1000 mg total) at 2 pm, 3 pm, and 10 pm the day before surgery, Disp: 6 tablet, Rfl: 0   norethindrone  (AYGESTIN ) 5 MG tablet, Take 1 tablet (5 mg total) by mouth daily., Disp: 90 tablet, Rfl: 2   oxyCODONE  (OXY IR/ROXICODONE ) 5 MG immediate release tablet, Take 1 tablet (5 mg total) by mouth every 6 (six) hours as needed for severe pain (pain score 7-10). Do not take and drive. Use caution, may cause drowsiness, Disp: 20 tablet, Rfl: 0   polyethylene glycol powder (MIRALAX ) 17 GM/SCOOP powder, Starting at 11 am on the day before surgery, mix whole bottle of Miralax  in 64 oz of Gatorade and drink one 8 oz glass every 15 minutes until gone., Disp: 238 g, Rfl: 0   UNABLE TO FIND, Gallilrey 5mg  daily in am, Disp: , Rfl:

## 2024-12-29 ENCOUNTER — Inpatient Hospital Stay: Admitting: Gynecologic Oncology

## 2024-12-29 ENCOUNTER — Telehealth: Payer: Self-pay | Admitting: *Deleted

## 2024-12-29 NOTE — Telephone Encounter (Signed)
 Spoke with the patient and moved appt from 1/26 to 1/27 due to anticipation weather

## 2024-12-30 ENCOUNTER — Inpatient Hospital Stay

## 2025-01-02 ENCOUNTER — Inpatient Hospital Stay

## 2025-01-03 ENCOUNTER — Inpatient Hospital Stay: Admitting: Gynecologic Oncology

## 2025-01-03 VITALS — BP 148/84 | HR 85 | Temp 98.5°F | Resp 18 | Ht 69.0 in | Wt 334.0 lb

## 2025-01-03 DIAGNOSIS — N809 Endometriosis, unspecified: Secondary | ICD-10-CM

## 2025-01-03 NOTE — Progress Notes (Signed)
 Kristin Boyd seen today in clinic for abdominal incision staple removal.  Pt's mid-line abdominal incision healing well without redness or drainage. Old blood noted to middle of incision close to the naval. Incision cleaned with normal saline.   Staples removed with mild discomfort. Benzoine and steri strips applied.   Patient reminded of her post op appointment with Dr. Viktoria on Thursday, February 12 th at  3:45 pm. Pt is aware and thanked the office.

## 2025-01-05 ENCOUNTER — Other Ambulatory Visit: Payer: Self-pay | Admitting: Gynecologic Oncology

## 2025-01-05 DIAGNOSIS — N809 Endometriosis, unspecified: Secondary | ICD-10-CM

## 2025-01-05 DIAGNOSIS — G8929 Other chronic pain: Secondary | ICD-10-CM

## 2025-01-05 MED ORDER — OXYCODONE HCL 5 MG PO TABS: 5.0000 mg | ORAL_TABLET | Freq: Four times a day (QID) | ORAL | 0 refills | Status: AC | PRN

## 2025-01-05 MED ORDER — IBUPROFEN 800 MG PO TABS: 800.0000 mg | ORAL_TABLET | Freq: Three times a day (TID) | ORAL | 0 refills | Status: AC | PRN

## 2025-01-19 ENCOUNTER — Inpatient Hospital Stay: Attending: Gynecologic Oncology | Admitting: Gynecologic Oncology
